# Patient Record
Sex: Female | Born: 1935 | Race: Black or African American | Hispanic: No | Marital: Married | Smoking: Never smoker
Health system: Southern US, Community
[De-identification: ages and names within clinical notes are randomized; demographics above are authoritative.]

## PROBLEM LIST (undated history)

## (undated) DIAGNOSIS — R630 Anorexia: Secondary | ICD-10-CM

## (undated) DIAGNOSIS — E876 Hypokalemia: Secondary | ICD-10-CM

## (undated) DIAGNOSIS — Z9889 Other specified postprocedural states: Secondary | ICD-10-CM

## (undated) DIAGNOSIS — Z5189 Encounter for other specified aftercare: Secondary | ICD-10-CM

## (undated) DIAGNOSIS — F0390 Unspecified dementia without behavioral disturbance: Secondary | ICD-10-CM

## (undated) DIAGNOSIS — I639 Cerebral infarction, unspecified: Secondary | ICD-10-CM

## (undated) DIAGNOSIS — C541 Malignant neoplasm of endometrium: Secondary | ICD-10-CM

## (undated) DIAGNOSIS — K219 Gastro-esophageal reflux disease without esophagitis: Secondary | ICD-10-CM

## (undated) DIAGNOSIS — K589 Irritable bowel syndrome without diarrhea: Secondary | ICD-10-CM

## (undated) DIAGNOSIS — G20A1 Parkinson's disease without dyskinesia, without mention of fluctuations: Secondary | ICD-10-CM

## (undated) DIAGNOSIS — D689 Coagulation defect, unspecified: Secondary | ICD-10-CM

## (undated) DIAGNOSIS — I2699 Other pulmonary embolism without acute cor pulmonale: Secondary | ICD-10-CM

## (undated) DIAGNOSIS — G629 Polyneuropathy, unspecified: Secondary | ICD-10-CM

## (undated) DIAGNOSIS — N189 Chronic kidney disease, unspecified: Secondary | ICD-10-CM

## (undated) DIAGNOSIS — G20 Parkinson's disease: Secondary | ICD-10-CM

## (undated) DIAGNOSIS — K449 Diaphragmatic hernia without obstruction or gangrene: Secondary | ICD-10-CM

## (undated) DIAGNOSIS — IMO0001 Reserved for inherently not codable concepts without codable children: Secondary | ICD-10-CM

## (undated) DIAGNOSIS — F419 Anxiety disorder, unspecified: Secondary | ICD-10-CM

## (undated) DIAGNOSIS — G40909 Epilepsy, unspecified, not intractable, without status epilepticus: Secondary | ICD-10-CM

## (undated) DIAGNOSIS — F329 Major depressive disorder, single episode, unspecified: Secondary | ICD-10-CM

## (undated) DIAGNOSIS — E042 Nontoxic multinodular goiter: Secondary | ICD-10-CM

## (undated) DIAGNOSIS — K529 Noninfective gastroenteritis and colitis, unspecified: Secondary | ICD-10-CM

## (undated) DIAGNOSIS — I82409 Acute embolism and thrombosis of unspecified deep veins of unspecified lower extremity: Secondary | ICD-10-CM

## (undated) DIAGNOSIS — G4700 Insomnia, unspecified: Secondary | ICD-10-CM

## (undated) DIAGNOSIS — F32A Depression, unspecified: Secondary | ICD-10-CM

## (undated) DIAGNOSIS — IMO0002 Reserved for concepts with insufficient information to code with codable children: Secondary | ICD-10-CM

## (undated) HISTORY — DX: Insomnia, unspecified: G47.00

## (undated) HISTORY — DX: Depression, unspecified: F32.A

## (undated) HISTORY — DX: Hypokalemia: E87.6

## (undated) HISTORY — DX: Coagulation defect, unspecified: D68.9

## (undated) HISTORY — DX: Noninfective gastroenteritis and colitis, unspecified: K52.9

## (undated) HISTORY — DX: Encounter for other specified aftercare: Z51.89

## (undated) HISTORY — DX: Other pulmonary embolism without acute cor pulmonale: I26.99

## (undated) HISTORY — DX: Epilepsy, unspecified, not intractable, without status epilepticus: G40.909

## (undated) HISTORY — DX: Gastro-esophageal reflux disease without esophagitis: K21.9

## (undated) HISTORY — DX: Cerebral infarction, unspecified: I63.9

## (undated) HISTORY — DX: Anxiety disorder, unspecified: F41.9

## (undated) HISTORY — DX: Irritable bowel syndrome, unspecified: K58.9

## (undated) HISTORY — PX: LYMPHADENECTOMY: SHX15

## (undated) HISTORY — DX: Acute embolism and thrombosis of unspecified deep veins of unspecified lower extremity: I82.409

## (undated) HISTORY — DX: Polyneuropathy, unspecified: G62.9

## (undated) HISTORY — DX: Unspecified dementia, unspecified severity, without behavioral disturbance, psychotic disturbance, mood disturbance, and anxiety: F03.90

## (undated) HISTORY — PX: CHOLECYSTECTOMY: SHX55

## (undated) HISTORY — DX: Diaphragmatic hernia without obstruction or gangrene: K44.9

## (undated) HISTORY — DX: Other specified postprocedural states: Z98.890

## (undated) HISTORY — DX: Anorexia: R63.0

## (undated) HISTORY — PX: OTHER SURGICAL HISTORY: SHX169

## (undated) HISTORY — DX: Major depressive disorder, single episode, unspecified: F32.9

## (undated) HISTORY — DX: Nontoxic multinodular goiter: E04.2

---

## 1999-06-19 ENCOUNTER — Encounter: Admission: RE | Admit: 1999-06-19 | Discharge: 1999-06-19 | Payer: Self-pay | Admitting: Internal Medicine

## 1999-06-19 ENCOUNTER — Encounter: Payer: Self-pay | Admitting: Internal Medicine

## 1999-12-10 ENCOUNTER — Other Ambulatory Visit: Admission: RE | Admit: 1999-12-10 | Discharge: 1999-12-10 | Payer: Self-pay

## 1999-12-10 ENCOUNTER — Encounter (INDEPENDENT_AMBULATORY_CARE_PROVIDER_SITE_OTHER): Payer: Self-pay | Admitting: Specialist

## 2000-04-22 ENCOUNTER — Other Ambulatory Visit: Admission: RE | Admit: 2000-04-22 | Discharge: 2000-04-22 | Payer: Self-pay | Admitting: Otolaryngology

## 2000-04-22 ENCOUNTER — Encounter (INDEPENDENT_AMBULATORY_CARE_PROVIDER_SITE_OTHER): Payer: Self-pay

## 2002-05-25 ENCOUNTER — Other Ambulatory Visit: Admission: RE | Admit: 2002-05-25 | Discharge: 2002-05-25 | Payer: Self-pay | Admitting: Internal Medicine

## 2004-05-29 ENCOUNTER — Other Ambulatory Visit: Admission: RE | Admit: 2004-05-29 | Discharge: 2004-05-29 | Payer: Self-pay | Admitting: Internal Medicine

## 2005-01-05 HISTORY — PX: OTHER SURGICAL HISTORY: SHX169

## 2005-02-26 ENCOUNTER — Emergency Department (HOSPITAL_COMMUNITY): Admission: EM | Admit: 2005-02-26 | Discharge: 2005-02-26 | Payer: Self-pay | Admitting: Emergency Medicine

## 2005-07-07 ENCOUNTER — Ambulatory Visit (HOSPITAL_COMMUNITY): Admission: AD | Admit: 2005-07-07 | Discharge: 2005-07-07 | Payer: Self-pay | Admitting: Obstetrics and Gynecology

## 2005-07-07 ENCOUNTER — Encounter (INDEPENDENT_AMBULATORY_CARE_PROVIDER_SITE_OTHER): Payer: Self-pay | Admitting: *Deleted

## 2005-07-07 ENCOUNTER — Ambulatory Visit: Payer: Self-pay | Admitting: Obstetrics and Gynecology

## 2005-07-28 ENCOUNTER — Encounter: Payer: Self-pay | Admitting: Vascular Surgery

## 2005-07-28 ENCOUNTER — Inpatient Hospital Stay (HOSPITAL_COMMUNITY): Admission: EM | Admit: 2005-07-28 | Discharge: 2005-07-31 | Payer: Self-pay | Admitting: Emergency Medicine

## 2005-08-04 ENCOUNTER — Ambulatory Visit: Admission: RE | Admit: 2005-08-04 | Discharge: 2005-08-04 | Payer: Self-pay | Admitting: Gynecologic Oncology

## 2005-08-11 ENCOUNTER — Inpatient Hospital Stay (HOSPITAL_COMMUNITY): Admission: RE | Admit: 2005-08-11 | Discharge: 2005-08-14 | Payer: Self-pay | Admitting: Obstetrics & Gynecology

## 2005-08-11 ENCOUNTER — Encounter (INDEPENDENT_AMBULATORY_CARE_PROVIDER_SITE_OTHER): Payer: Self-pay | Admitting: Specialist

## 2005-08-11 HISTORY — PX: ABDOMINAL HYSTERECTOMY: SHX81

## 2005-08-11 HISTORY — PX: LAPAROSCOPIC ASSISTED RADICAL VAGINAL HYSTERECTOMY W/ NODE BIOPSY: SHX1919

## 2005-08-25 ENCOUNTER — Ambulatory Visit (HOSPITAL_COMMUNITY): Admission: RE | Admit: 2005-08-25 | Discharge: 2005-08-25 | Payer: Self-pay | Admitting: Gynecology

## 2005-09-03 ENCOUNTER — Inpatient Hospital Stay (HOSPITAL_COMMUNITY): Admission: EM | Admit: 2005-09-03 | Discharge: 2005-09-06 | Payer: Self-pay | Admitting: Internal Medicine

## 2005-09-08 ENCOUNTER — Ambulatory Visit: Admission: RE | Admit: 2005-09-08 | Discharge: 2005-09-08 | Payer: Self-pay | Admitting: Gynecologic Oncology

## 2005-09-12 ENCOUNTER — Emergency Department (HOSPITAL_COMMUNITY): Admission: EM | Admit: 2005-09-12 | Discharge: 2005-09-12 | Payer: Self-pay | Admitting: Emergency Medicine

## 2005-09-14 ENCOUNTER — Ambulatory Visit (HOSPITAL_COMMUNITY): Admission: RE | Admit: 2005-09-14 | Discharge: 2005-09-14 | Payer: Self-pay | Admitting: Gynecologic Oncology

## 2005-09-22 ENCOUNTER — Ambulatory Visit: Admission: RE | Admit: 2005-09-22 | Discharge: 2005-09-22 | Payer: Self-pay | Admitting: Gynecologic Oncology

## 2005-09-24 ENCOUNTER — Ambulatory Visit: Payer: Self-pay | Admitting: Hematology and Oncology

## 2005-09-27 ENCOUNTER — Inpatient Hospital Stay (HOSPITAL_COMMUNITY): Admission: EM | Admit: 2005-09-27 | Discharge: 2005-09-30 | Payer: Self-pay | Admitting: Emergency Medicine

## 2005-10-09 ENCOUNTER — Ambulatory Visit (HOSPITAL_COMMUNITY): Admission: RE | Admit: 2005-10-09 | Discharge: 2005-10-09 | Payer: Self-pay | Admitting: Hematology and Oncology

## 2005-11-04 LAB — CBC WITH DIFFERENTIAL/PLATELET
EOS%: 0.1 % (ref 0.0–7.0)
Eosinophils Absolute: 0 10*3/uL (ref 0.0–0.5)
LYMPH%: 24.8 % (ref 14.0–48.0)
MCH: 29.8 pg (ref 26.0–34.0)
MCV: 89.3 fL (ref 81.0–101.0)
MONO%: 8.5 % (ref 0.0–13.0)
NEUT#: 3.9 10*3/uL (ref 1.5–6.5)
Platelets: 258 10*3/uL (ref 145–400)
RBC: 3.92 10*6/uL (ref 3.70–5.32)

## 2005-11-04 LAB — COMPREHENSIVE METABOLIC PANEL
Alkaline Phosphatase: 52 U/L (ref 39–117)
BUN: 8 mg/dL (ref 6–23)
Glucose, Bld: 90 mg/dL (ref 70–99)
Sodium: 141 mEq/L (ref 135–145)
Total Bilirubin: 1.2 mg/dL (ref 0.3–1.2)
Total Protein: 5.9 g/dL — ABNORMAL LOW (ref 6.0–8.3)

## 2005-11-05 ENCOUNTER — Ambulatory Visit: Payer: Self-pay | Admitting: Hematology and Oncology

## 2005-11-13 LAB — CBC WITH DIFFERENTIAL/PLATELET
BASO%: 0.6 % (ref 0.0–2.0)
Eosinophils Absolute: 0.1 10*3/uL (ref 0.0–0.5)
HCT: 35.8 % (ref 34.8–46.6)
LYMPH%: 45.5 % (ref 14.0–48.0)
MCHC: 33.4 g/dL (ref 32.0–36.0)
MONO#: 0.3 10*3/uL (ref 0.1–0.9)
NEUT#: 2.1 10*3/uL (ref 1.5–6.5)
NEUT%: 46.2 % (ref 39.6–76.8)
Platelets: 44 10*3/uL — ABNORMAL LOW (ref 145–400)
RBC: 4.03 10*6/uL (ref 3.70–5.32)
WBC: 4.6 10*3/uL (ref 3.9–10.0)
lymph#: 2.1 10*3/uL (ref 0.9–3.3)

## 2005-11-13 LAB — BASIC METABOLIC PANEL
CO2: 26 mEq/L (ref 19–32)
Calcium: 9.2 mg/dL (ref 8.4–10.5)
Chloride: 100 mEq/L (ref 96–112)
Glucose, Bld: 105 mg/dL — ABNORMAL HIGH (ref 70–99)
Sodium: 137 mEq/L (ref 135–145)

## 2005-11-23 ENCOUNTER — Encounter: Admission: RE | Admit: 2005-11-23 | Discharge: 2005-11-23 | Payer: Self-pay | Admitting: Internal Medicine

## 2005-11-24 LAB — COMPREHENSIVE METABOLIC PANEL
ALT: 10 U/L (ref 0–35)
AST: 13 U/L (ref 0–37)
Alkaline Phosphatase: 61 U/L (ref 39–117)
Glucose, Bld: 107 mg/dL — ABNORMAL HIGH (ref 70–99)
Potassium: 3.2 mEq/L — ABNORMAL LOW (ref 3.5–5.3)
Sodium: 142 mEq/L (ref 135–145)
Total Bilirubin: 1 mg/dL (ref 0.3–1.2)
Total Protein: 6.4 g/dL (ref 6.0–8.3)

## 2005-11-24 LAB — CBC WITH DIFFERENTIAL/PLATELET
BASO%: 0.3 % (ref 0.0–2.0)
EOS%: 0.2 % (ref 0.0–7.0)
LYMPH%: 33.1 % (ref 14.0–48.0)
MCHC: 34.7 g/dL (ref 32.0–36.0)
MCV: 89.7 fL (ref 81.0–101.0)
MONO%: 7 % (ref 0.0–13.0)
Platelets: 370 10*3/uL (ref 145–400)
RBC: 3.58 10*6/uL — ABNORMAL LOW (ref 3.70–5.32)
RDW: 18.6 % — ABNORMAL HIGH (ref 11.3–14.5)

## 2005-12-05 DIAGNOSIS — E042 Nontoxic multinodular goiter: Secondary | ICD-10-CM

## 2005-12-05 HISTORY — DX: Nontoxic multinodular goiter: E04.2

## 2005-12-11 ENCOUNTER — Ambulatory Visit (HOSPITAL_COMMUNITY): Admission: RE | Admit: 2005-12-11 | Discharge: 2005-12-11 | Payer: Self-pay | Admitting: Hematology and Oncology

## 2005-12-14 LAB — CBC WITH DIFFERENTIAL/PLATELET
Basophils Absolute: 0 10*3/uL (ref 0.0–0.1)
Eosinophils Absolute: 0 10*3/uL (ref 0.0–0.5)
HCT: 39 % (ref 34.8–46.6)
HGB: 13.2 g/dL (ref 11.6–15.9)
MCH: 31.3 pg (ref 26.0–34.0)
NEUT#: 2 10*3/uL (ref 1.5–6.5)
NEUT%: 48.8 % (ref 39.6–76.8)
RDW: 18.8 % — ABNORMAL HIGH (ref 11.3–14.5)
lymph#: 1.8 10*3/uL (ref 0.9–3.3)

## 2005-12-14 LAB — COMPREHENSIVE METABOLIC PANEL
Alkaline Phosphatase: 59 U/L (ref 39–117)
BUN: 10 mg/dL (ref 6–23)
Creatinine, Ser: 0.74 mg/dL (ref 0.40–1.20)
Glucose, Bld: 104 mg/dL — ABNORMAL HIGH (ref 70–99)
Sodium: 141 mEq/L (ref 135–145)
Total Bilirubin: 1.6 mg/dL — ABNORMAL HIGH (ref 0.3–1.2)
Total Protein: 7 g/dL (ref 6.0–8.3)

## 2005-12-15 LAB — CBC WITH DIFFERENTIAL/PLATELET
Basophils Absolute: 0 10*3/uL (ref 0.0–0.1)
Eosinophils Absolute: 0 10*3/uL (ref 0.0–0.5)
HCT: 34.9 % (ref 34.8–46.6)
HGB: 11.9 g/dL (ref 11.6–15.9)
LYMPH%: 30.7 % (ref 14.0–48.0)
MCV: 92.6 fL (ref 81.0–101.0)
MONO#: 0.3 10*3/uL (ref 0.1–0.9)
MONO%: 7.2 % (ref 0.0–13.0)
NEUT#: 2.6 10*3/uL (ref 1.5–6.5)
NEUT%: 60.6 % (ref 39.6–76.8)
Platelets: 221 10*3/uL (ref 145–400)

## 2005-12-15 LAB — COMPREHENSIVE METABOLIC PANEL
Alkaline Phosphatase: 56 U/L (ref 39–117)
BUN: 5 mg/dL — ABNORMAL LOW (ref 6–23)
CO2: 27 mEq/L (ref 19–32)
Glucose, Bld: 100 mg/dL — ABNORMAL HIGH (ref 70–99)
Total Bilirubin: 1.3 mg/dL — ABNORMAL HIGH (ref 0.3–1.2)

## 2005-12-15 LAB — CA 125: CA 125: 7.1 U/mL (ref 0.0–30.2)

## 2005-12-18 ENCOUNTER — Ambulatory Visit: Admission: RE | Admit: 2005-12-18 | Discharge: 2006-03-18 | Payer: Self-pay | Admitting: Radiation Oncology

## 2006-01-12 ENCOUNTER — Ambulatory Visit: Payer: Self-pay | Admitting: Hematology and Oncology

## 2006-01-12 LAB — CBC WITH DIFFERENTIAL/PLATELET
Basophils Absolute: 0 10*3/uL (ref 0.0–0.1)
Eosinophils Absolute: 0 10*3/uL (ref 0.0–0.5)
HGB: 12 g/dL (ref 11.6–15.9)
LYMPH%: 45 % (ref 14.0–48.0)
MCV: 95 fL (ref 81.0–101.0)
MONO%: 7.1 % (ref 0.0–13.0)
NEUT#: 1.2 10*3/uL — ABNORMAL LOW (ref 1.5–6.5)
Platelets: 195 10*3/uL (ref 145–400)

## 2006-01-14 LAB — CBC WITH DIFFERENTIAL/PLATELET
Basophils Absolute: 0 10*3/uL (ref 0.0–0.1)
Eosinophils Absolute: 0 10*3/uL (ref 0.0–0.5)
HGB: 12.4 g/dL (ref 11.6–15.9)
LYMPH%: 39.3 % (ref 14.0–48.0)
MCV: 92.9 fL (ref 81.0–101.0)
MONO%: 5.9 % (ref 0.0–13.0)
NEUT#: 1.2 10*3/uL — ABNORMAL LOW (ref 1.5–6.5)
Platelets: 150 10*3/uL (ref 145–400)
RDW: 13 % (ref 11.3–14.5)

## 2006-01-14 LAB — BASIC METABOLIC PANEL
BUN: 10 mg/dL (ref 6–23)
CO2: 27 mEq/L (ref 19–32)
Glucose, Bld: 113 mg/dL — ABNORMAL HIGH (ref 70–99)
Potassium: 3.4 mEq/L — ABNORMAL LOW (ref 3.5–5.3)

## 2006-01-19 LAB — CBC WITH DIFFERENTIAL/PLATELET
BASO%: 0.3 % (ref 0.0–2.0)
EOS%: 0 % (ref 0.0–7.0)
LYMPH%: 32.1 % (ref 14.0–48.0)
MCH: 32.5 pg (ref 26.0–34.0)
MCHC: 34.3 g/dL (ref 32.0–36.0)
MONO#: 0.2 10*3/uL (ref 0.1–0.9)
MONO%: 8.6 % (ref 0.0–13.0)
Platelets: 169 10*3/uL (ref 145–400)
RBC: 3.79 10*6/uL (ref 3.70–5.32)
WBC: 2.4 10*3/uL — ABNORMAL LOW (ref 3.9–10.0)

## 2006-01-20 ENCOUNTER — Ambulatory Visit: Admission: RE | Admit: 2006-01-20 | Discharge: 2006-01-20 | Payer: Self-pay | Admitting: Gynecology

## 2006-01-26 LAB — CBC WITH DIFFERENTIAL/PLATELET
BASO%: 0.4 % (ref 0.0–2.0)
EOS%: 19.7 % — ABNORMAL HIGH (ref 0.0–7.0)
HCT: 34.9 % (ref 34.8–46.6)
MCH: 32.5 pg (ref 26.0–34.0)
MCHC: 34.3 g/dL (ref 32.0–36.0)
MONO#: 0.2 10*3/uL (ref 0.1–0.9)
RBC: 3.68 10*6/uL — ABNORMAL LOW (ref 3.70–5.32)
RDW: 14.1 % (ref 11.3–14.5)
WBC: 2.3 10*3/uL — ABNORMAL LOW (ref 3.9–10.0)
lymph#: 0.6 10*3/uL — ABNORMAL LOW (ref 0.9–3.3)

## 2006-02-02 LAB — CBC WITH DIFFERENTIAL/PLATELET
BASO%: 0.4 % (ref 0.0–2.0)
Basophils Absolute: 0 10*3/uL (ref 0.0–0.1)
HCT: 36.6 % (ref 34.8–46.6)
HGB: 12.5 g/dL (ref 11.6–15.9)
MONO#: 0.2 10*3/uL (ref 0.1–0.9)
NEUT#: 1.3 10*3/uL — ABNORMAL LOW (ref 1.5–6.5)
NEUT%: 51.1 % (ref 39.6–76.8)
RDW: 13.9 % (ref 11.3–14.5)
WBC: 2.5 10*3/uL — ABNORMAL LOW (ref 3.9–10.0)
lymph#: 0.4 10*3/uL — ABNORMAL LOW (ref 0.9–3.3)

## 2006-02-08 LAB — CBC WITH DIFFERENTIAL/PLATELET
Basophils Absolute: 0 10*3/uL (ref 0.0–0.1)
EOS%: 19.5 % — ABNORMAL HIGH (ref 0.0–7.0)
Eosinophils Absolute: 0.5 10*3/uL (ref 0.0–0.5)
HGB: 12.2 g/dL (ref 11.6–15.9)
LYMPH%: 14.5 % (ref 14.0–48.0)
MCH: 32.3 pg (ref 26.0–34.0)
MCV: 92.8 fL (ref 81.0–101.0)
MONO%: 9.6 % (ref 0.0–13.0)
NEUT#: 1.4 10*3/uL — ABNORMAL LOW (ref 1.5–6.5)
Platelets: 230 10*3/uL (ref 145–400)
RDW: 14 % (ref 11.3–14.5)

## 2006-02-08 LAB — BASIC METABOLIC PANEL
BUN: 6 mg/dL (ref 6–23)
CO2: 32 mEq/L (ref 19–32)
Chloride: 100 mEq/L (ref 96–112)
Creatinine, Ser: 0.7 mg/dL (ref 0.40–1.20)
Potassium: 2.8 mEq/L — ABNORMAL LOW (ref 3.5–5.3)

## 2006-02-17 LAB — CBC WITH DIFFERENTIAL/PLATELET
BASO%: 0.5 % (ref 0.0–2.0)
Eosinophils Absolute: 0.1 10*3/uL (ref 0.0–0.5)
LYMPH%: 10.7 % — ABNORMAL LOW (ref 14.0–48.0)
MCHC: 36 g/dL (ref 32.0–36.0)
MONO#: 0.2 10*3/uL (ref 0.1–0.9)
NEUT#: 2.2 10*3/uL (ref 1.5–6.5)
RBC: 4 10*6/uL (ref 3.70–5.32)
RDW: 13.6 % (ref 11.3–14.5)
WBC: 2.8 10*3/uL — ABNORMAL LOW (ref 3.9–10.0)
lymph#: 0.3 10*3/uL — ABNORMAL LOW (ref 0.9–3.3)

## 2006-02-17 LAB — BASIC METABOLIC PANEL
BUN: 5 mg/dL — ABNORMAL LOW (ref 6–23)
Calcium: 8.8 mg/dL (ref 8.4–10.5)
Creatinine, Ser: 0.65 mg/dL (ref 0.40–1.20)
Glucose, Bld: 97 mg/dL (ref 70–99)
Potassium: 2.8 mEq/L — ABNORMAL LOW (ref 3.5–5.3)

## 2006-03-24 ENCOUNTER — Ambulatory Visit: Admission: RE | Admit: 2006-03-24 | Discharge: 2006-03-24 | Payer: Self-pay | Admitting: Gynecology

## 2006-04-05 ENCOUNTER — Ambulatory Visit: Payer: Self-pay | Admitting: Hematology and Oncology

## 2006-04-07 LAB — CBC WITH DIFFERENTIAL/PLATELET
BASO%: 0 % (ref 0.0–2.0)
Eosinophils Absolute: 0 10*3/uL (ref 0.0–0.5)
HCT: 32.3 % — ABNORMAL LOW (ref 34.8–46.6)
HGB: 11.3 g/dL — ABNORMAL LOW (ref 11.6–15.9)
MCHC: 35 g/dL (ref 32.0–36.0)
MONO#: 0.3 10*3/uL (ref 0.1–0.9)
NEUT#: 1.8 10*3/uL (ref 1.5–6.5)
NEUT%: 70 % (ref 39.6–76.8)
Platelets: 208 10*3/uL (ref 145–400)
WBC: 2.6 10*3/uL — ABNORMAL LOW (ref 3.9–10.0)
lymph#: 0.4 10*3/uL — ABNORMAL LOW (ref 0.9–3.3)

## 2006-04-07 LAB — COMPREHENSIVE METABOLIC PANEL
ALT: 14 U/L (ref 0–35)
CO2: 28 mEq/L (ref 19–32)
Calcium: 9.4 mg/dL (ref 8.4–10.5)
Chloride: 105 mEq/L (ref 96–112)
Creatinine, Ser: 0.72 mg/dL (ref 0.40–1.20)
Glucose, Bld: 89 mg/dL (ref 70–99)
Sodium: 141 mEq/L (ref 135–145)
Total Protein: 6.4 g/dL (ref 6.0–8.3)

## 2006-04-28 LAB — CBC WITH DIFFERENTIAL/PLATELET
BASO%: 0.2 % (ref 0.0–2.0)
MCHC: 35.4 g/dL (ref 32.0–36.0)
MONO#: 0.3 10*3/uL (ref 0.1–0.9)
NEUT#: 2.2 10*3/uL (ref 1.5–6.5)
RBC: 3.55 10*6/uL — ABNORMAL LOW (ref 3.70–5.32)
WBC: 2.9 10*3/uL — ABNORMAL LOW (ref 3.9–10.0)
lymph#: 0.4 10*3/uL — ABNORMAL LOW (ref 0.9–3.3)

## 2006-04-28 LAB — COMPREHENSIVE METABOLIC PANEL
ALT: 15 U/L (ref 0–35)
Albumin: 4 g/dL (ref 3.5–5.2)
CO2: 28 mEq/L (ref 19–32)
Calcium: 9 mg/dL (ref 8.4–10.5)
Chloride: 105 mEq/L (ref 96–112)
Potassium: 3.7 mEq/L (ref 3.5–5.3)
Sodium: 142 mEq/L (ref 135–145)
Total Protein: 6.8 g/dL (ref 6.0–8.3)

## 2006-05-18 ENCOUNTER — Ambulatory Visit: Payer: Self-pay | Admitting: Hematology and Oncology

## 2006-05-19 LAB — CBC WITH DIFFERENTIAL/PLATELET
BASO%: 0 % (ref 0.0–2.0)
LYMPH%: 9.4 % — ABNORMAL LOW (ref 14.0–48.0)
MCHC: 34.8 g/dL (ref 32.0–36.0)
MONO#: 0.4 10*3/uL (ref 0.1–0.9)
Platelets: 134 10*3/uL — ABNORMAL LOW (ref 145–400)
RBC: 3.38 10*6/uL — ABNORMAL LOW (ref 3.70–5.32)
WBC: 4.3 10*3/uL (ref 3.9–10.0)
lymph#: 0.4 10*3/uL — ABNORMAL LOW (ref 0.9–3.3)

## 2006-05-19 LAB — COMPREHENSIVE METABOLIC PANEL
ALT: 10 U/L (ref 0–35)
CO2: 30 mEq/L (ref 19–32)
Sodium: 143 mEq/L (ref 135–145)
Total Bilirubin: 0.8 mg/dL (ref 0.3–1.2)
Total Protein: 6.7 g/dL (ref 6.0–8.3)

## 2006-05-27 LAB — CBC WITH DIFFERENTIAL/PLATELET
BASO%: 0.5 % (ref 0.0–2.0)
EOS%: 0.1 % (ref 0.0–7.0)
LYMPH%: 4.4 % — ABNORMAL LOW (ref 14.0–48.0)
MCH: 33.9 pg (ref 26.0–34.0)
MCHC: 35.4 g/dL (ref 32.0–36.0)
MCV: 95.6 fL (ref 81.0–101.0)
MONO%: 4.4 % (ref 0.0–13.0)
Platelets: 74 10*3/uL — ABNORMAL LOW (ref 145–400)
RBC: 3.66 10*6/uL — ABNORMAL LOW (ref 3.70–5.32)
WBC: 8.4 10*3/uL (ref 3.9–10.0)

## 2006-05-27 LAB — BASIC METABOLIC PANEL
Calcium: 9.6 mg/dL (ref 8.4–10.5)
Sodium: 142 mEq/L (ref 135–145)

## 2006-06-01 LAB — FECAL OCCULT BLOOD, GUAIAC: Occult Blood: NEGATIVE

## 2006-06-09 LAB — CBC WITH DIFFERENTIAL/PLATELET
BASO%: 0.8 % (ref 0.0–2.0)
Eosinophils Absolute: 0 10*3/uL (ref 0.0–0.5)
MONO#: 0.4 10*3/uL (ref 0.1–0.9)
MONO%: 9.3 % (ref 0.0–13.0)
NEUT#: 3.3 10*3/uL (ref 1.5–6.5)
RBC: 3.18 10*6/uL — ABNORMAL LOW (ref 3.70–5.32)
RDW: 13.4 % (ref 11.3–14.5)
WBC: 4.4 10*3/uL (ref 3.9–10.0)

## 2006-06-25 ENCOUNTER — Ambulatory Visit (HOSPITAL_COMMUNITY): Admission: RE | Admit: 2006-06-25 | Discharge: 2006-06-25 | Payer: Self-pay | Admitting: Oncology

## 2006-06-29 ENCOUNTER — Ambulatory Visit: Payer: Self-pay | Admitting: Hematology and Oncology

## 2006-07-02 ENCOUNTER — Ambulatory Visit: Admission: RE | Admit: 2006-07-02 | Discharge: 2006-07-02 | Payer: Self-pay | Admitting: Gynecology

## 2006-07-02 ENCOUNTER — Encounter: Payer: Self-pay | Admitting: Gynecology

## 2006-07-02 ENCOUNTER — Other Ambulatory Visit: Admission: RE | Admit: 2006-07-02 | Discharge: 2006-07-02 | Payer: Self-pay | Admitting: Gynecology

## 2006-07-02 LAB — COMPREHENSIVE METABOLIC PANEL
ALT: 10 U/L (ref 0–35)
Albumin: 3.8 g/dL (ref 3.5–5.2)
CO2: 27 mEq/L (ref 19–32)
Calcium: 9.1 mg/dL (ref 8.4–10.5)
Chloride: 107 mEq/L (ref 96–112)
Glucose, Bld: 90 mg/dL (ref 70–99)
Sodium: 140 mEq/L (ref 135–145)
Total Bilirubin: 0.8 mg/dL (ref 0.3–1.2)
Total Protein: 6.4 g/dL (ref 6.0–8.3)

## 2006-07-02 LAB — CBC WITH DIFFERENTIAL/PLATELET
Eosinophils Absolute: 0 10*3/uL (ref 0.0–0.5)
HCT: 31.3 % — ABNORMAL LOW (ref 34.8–46.6)
LYMPH%: 18 % (ref 14.0–48.0)
MONO#: 0.3 10*3/uL (ref 0.1–0.9)
NEUT#: 1.9 10*3/uL (ref 1.5–6.5)
Platelets: 216 10*3/uL (ref 145–400)
RBC: 3.19 10*6/uL — ABNORMAL LOW (ref 3.70–5.32)
WBC: 2.7 10*3/uL — ABNORMAL LOW (ref 3.9–10.0)
lymph#: 0.5 10*3/uL — ABNORMAL LOW (ref 0.9–3.3)

## 2006-07-06 LAB — CBC WITH DIFFERENTIAL/PLATELET
BASO%: 1.3 % (ref 0.0–2.0)
Eosinophils Absolute: 0.1 10*3/uL (ref 0.0–0.5)
MCHC: 36.3 g/dL — ABNORMAL HIGH (ref 32.0–36.0)
MONO#: 0.3 10*3/uL (ref 0.1–0.9)
NEUT#: 1.8 10*3/uL (ref 1.5–6.5)
Platelets: 161 10*3/uL (ref 145–400)
RBC: 3.23 10*6/uL — ABNORMAL LOW (ref 3.70–5.32)
RDW: 12.3 % (ref 11.3–14.5)
WBC: 2.6 10*3/uL — ABNORMAL LOW (ref 3.9–10.0)
lymph#: 0.5 10*3/uL — ABNORMAL LOW (ref 0.9–3.3)

## 2006-09-03 ENCOUNTER — Other Ambulatory Visit: Admission: RE | Admit: 2006-09-03 | Discharge: 2006-09-03 | Payer: Self-pay | Admitting: Radiation Oncology

## 2006-09-03 ENCOUNTER — Ambulatory Visit: Admission: RE | Admit: 2006-09-03 | Discharge: 2006-09-28 | Payer: Self-pay | Admitting: Radiation Oncology

## 2006-09-03 ENCOUNTER — Encounter: Payer: Self-pay | Admitting: Radiation Oncology

## 2006-09-24 ENCOUNTER — Other Ambulatory Visit: Admission: RE | Admit: 2006-09-24 | Discharge: 2006-09-24 | Payer: Self-pay | Admitting: Gynecology

## 2006-09-24 ENCOUNTER — Ambulatory Visit: Admission: RE | Admit: 2006-09-24 | Discharge: 2006-09-24 | Payer: Self-pay | Admitting: Gynecology

## 2006-09-24 ENCOUNTER — Encounter: Payer: Self-pay | Admitting: Gynecology

## 2007-01-19 ENCOUNTER — Encounter: Payer: Self-pay | Admitting: Radiation Oncology

## 2007-01-19 ENCOUNTER — Other Ambulatory Visit: Admission: RE | Admit: 2007-01-19 | Discharge: 2007-01-19 | Payer: Self-pay | Admitting: Radiation Oncology

## 2007-01-19 ENCOUNTER — Ambulatory Visit: Admission: RE | Admit: 2007-01-19 | Discharge: 2007-02-15 | Payer: Self-pay | Admitting: Radiation Oncology

## 2007-04-08 ENCOUNTER — Ambulatory Visit: Admission: RE | Admit: 2007-04-08 | Discharge: 2007-04-08 | Payer: Self-pay | Admitting: Gynecology

## 2007-04-08 ENCOUNTER — Encounter: Payer: Self-pay | Admitting: Gynecology

## 2007-04-08 ENCOUNTER — Other Ambulatory Visit: Admission: RE | Admit: 2007-04-08 | Discharge: 2007-04-08 | Payer: Self-pay | Admitting: Gynecology

## 2007-07-20 ENCOUNTER — Ambulatory Visit: Admission: RE | Admit: 2007-07-20 | Discharge: 2007-07-20 | Payer: Self-pay | Admitting: Radiation Oncology

## 2007-07-20 ENCOUNTER — Encounter: Payer: Self-pay | Admitting: Radiation Oncology

## 2007-07-20 ENCOUNTER — Other Ambulatory Visit: Admission: RE | Admit: 2007-07-20 | Discharge: 2007-07-20 | Payer: Self-pay | Admitting: Radiation Oncology

## 2007-10-12 ENCOUNTER — Other Ambulatory Visit: Admission: RE | Admit: 2007-10-12 | Discharge: 2007-10-12 | Payer: Self-pay | Admitting: Gynecology

## 2007-10-12 ENCOUNTER — Ambulatory Visit: Admission: RE | Admit: 2007-10-12 | Discharge: 2007-10-12 | Payer: Self-pay | Admitting: Gynecology

## 2007-10-12 ENCOUNTER — Encounter: Payer: Self-pay | Admitting: Gynecology

## 2008-01-10 ENCOUNTER — Encounter: Admission: RE | Admit: 2008-01-10 | Discharge: 2008-01-10 | Payer: Self-pay | Admitting: Internal Medicine

## 2008-03-14 ENCOUNTER — Other Ambulatory Visit: Admission: RE | Admit: 2008-03-14 | Discharge: 2008-03-14 | Payer: Self-pay | Admitting: Gynecologic Oncology

## 2008-03-14 ENCOUNTER — Encounter: Payer: Self-pay | Admitting: Gynecologic Oncology

## 2008-03-14 ENCOUNTER — Ambulatory Visit: Admission: RE | Admit: 2008-03-14 | Discharge: 2008-03-14 | Payer: Self-pay | Admitting: Gynecologic Oncology

## 2008-03-30 ENCOUNTER — Encounter: Admission: RE | Admit: 2008-03-30 | Discharge: 2008-03-30 | Payer: Self-pay | Admitting: Internal Medicine

## 2008-07-24 ENCOUNTER — Other Ambulatory Visit: Admission: RE | Admit: 2008-07-24 | Discharge: 2008-07-24 | Payer: Self-pay | Admitting: Radiation Oncology

## 2008-07-24 ENCOUNTER — Encounter: Payer: Self-pay | Admitting: Radiation Oncology

## 2008-07-24 ENCOUNTER — Ambulatory Visit: Admission: RE | Admit: 2008-07-24 | Discharge: 2008-07-24 | Payer: Self-pay | Admitting: Radiation Oncology

## 2008-08-08 ENCOUNTER — Ambulatory Visit: Payer: Self-pay | Admitting: Pulmonary Disease

## 2008-08-08 ENCOUNTER — Inpatient Hospital Stay (HOSPITAL_COMMUNITY): Admission: EM | Admit: 2008-08-08 | Discharge: 2008-08-24 | Payer: Self-pay | Admitting: Emergency Medicine

## 2008-08-09 ENCOUNTER — Encounter (INDEPENDENT_AMBULATORY_CARE_PROVIDER_SITE_OTHER): Payer: Self-pay | Admitting: Emergency Medicine

## 2008-08-09 ENCOUNTER — Encounter (INDEPENDENT_AMBULATORY_CARE_PROVIDER_SITE_OTHER): Payer: Self-pay | Admitting: Internal Medicine

## 2008-08-20 ENCOUNTER — Ambulatory Visit: Payer: Self-pay | Admitting: Physical Medicine & Rehabilitation

## 2008-08-24 ENCOUNTER — Inpatient Hospital Stay (HOSPITAL_COMMUNITY)
Admission: RE | Admit: 2008-08-24 | Discharge: 2008-09-08 | Payer: Self-pay | Admitting: Physical Medicine & Rehabilitation

## 2008-08-24 ENCOUNTER — Ambulatory Visit: Payer: Self-pay | Admitting: Physical Medicine & Rehabilitation

## 2008-09-05 ENCOUNTER — Ambulatory Visit: Payer: Self-pay | Admitting: Psychology

## 2008-09-28 ENCOUNTER — Encounter: Admission: RE | Admit: 2008-09-28 | Discharge: 2008-09-28 | Payer: Self-pay | Admitting: Gastroenterology

## 2008-09-30 ENCOUNTER — Inpatient Hospital Stay (HOSPITAL_COMMUNITY): Admission: EM | Admit: 2008-09-30 | Discharge: 2008-10-04 | Payer: Self-pay | Admitting: Emergency Medicine

## 2008-11-28 ENCOUNTER — Other Ambulatory Visit: Admission: RE | Admit: 2008-11-28 | Discharge: 2008-11-28 | Payer: Self-pay | Admitting: Gynecology

## 2008-11-28 ENCOUNTER — Ambulatory Visit: Admission: RE | Admit: 2008-11-28 | Discharge: 2008-11-28 | Payer: Self-pay | Admitting: Gynecology

## 2009-03-26 ENCOUNTER — Ambulatory Visit: Admission: RE | Admit: 2009-03-26 | Discharge: 2009-03-26 | Payer: Self-pay | Admitting: Radiation Oncology

## 2009-03-26 ENCOUNTER — Other Ambulatory Visit: Admission: RE | Admit: 2009-03-26 | Discharge: 2009-03-26 | Payer: Self-pay | Admitting: Radiation Oncology

## 2009-05-24 ENCOUNTER — Ambulatory Visit: Admission: RE | Admit: 2009-05-24 | Discharge: 2009-05-24 | Payer: Self-pay | Admitting: Gynecology

## 2009-05-24 ENCOUNTER — Other Ambulatory Visit: Admission: RE | Admit: 2009-05-24 | Discharge: 2009-05-24 | Payer: Self-pay | Admitting: Gynecology

## 2009-07-02 ENCOUNTER — Ambulatory Visit: Payer: Self-pay | Admitting: Psychiatry

## 2009-07-08 ENCOUNTER — Inpatient Hospital Stay (HOSPITAL_COMMUNITY): Admission: EM | Admit: 2009-07-08 | Discharge: 2009-07-10 | Payer: Self-pay | Admitting: Emergency Medicine

## 2009-07-11 ENCOUNTER — Ambulatory Visit: Payer: Self-pay | Admitting: Psychiatry

## 2009-09-12 ENCOUNTER — Ambulatory Visit (HOSPITAL_COMMUNITY): Admission: RE | Admit: 2009-09-12 | Discharge: 2009-09-12 | Payer: Self-pay | Admitting: Gastroenterology

## 2009-12-05 ENCOUNTER — Ambulatory Visit: Admission: RE | Admit: 2009-12-05 | Discharge: 2009-12-05 | Payer: Self-pay | Admitting: Radiation Oncology

## 2009-12-05 ENCOUNTER — Other Ambulatory Visit
Admission: RE | Admit: 2009-12-05 | Discharge: 2009-12-05 | Payer: Self-pay | Source: Home / Self Care | Admitting: Radiation Oncology

## 2010-01-05 HISTORY — PX: COLONOSCOPY: SHX174

## 2010-01-05 HISTORY — PX: ESOPHAGOGASTRODUODENOSCOPY: SHX1529

## 2010-01-27 ENCOUNTER — Encounter: Payer: Self-pay | Admitting: Geriatric Medicine

## 2010-02-02 IMAGING — RF DG ESOPHAGUS
19 of 24 series · 19 of 24 positions shown · non-contrast
Comparison: [id number]

CLINICAL DATA: Dysphagia

ESOPHOGRAM / BARIUM SWALLOW
TECHNIQUE: Single contrast examination was performed using
Fluoroscopy time:  1.2 minutes.

[Series 1: run · 1 of 1 slices shown (1 of 19)]
[im 1/1]
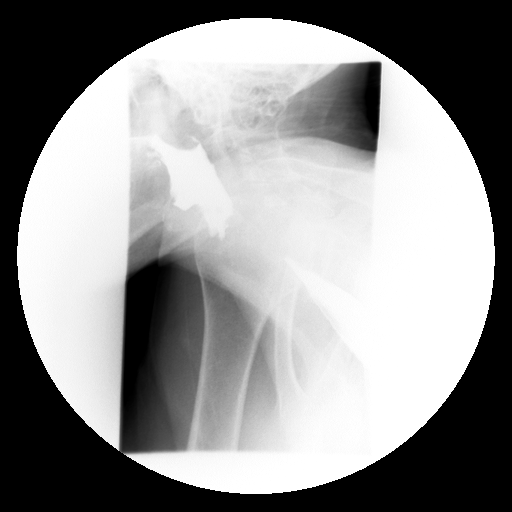

[Series 2: run · 1 of 1 slices shown (2 of 19)]
[im 1/1]
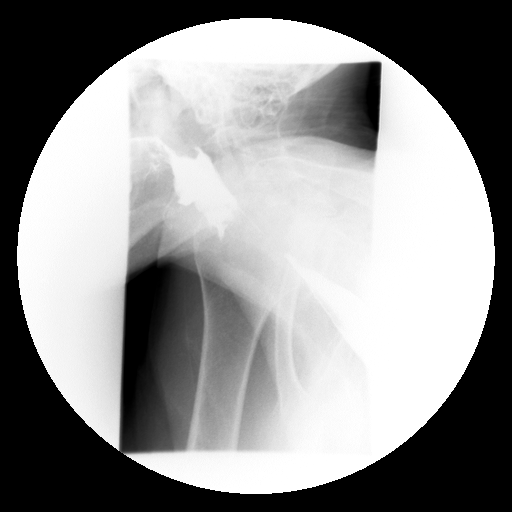

[Series 4: run · 1 of 1 slices shown (3 of 19)]
[im 1/1]
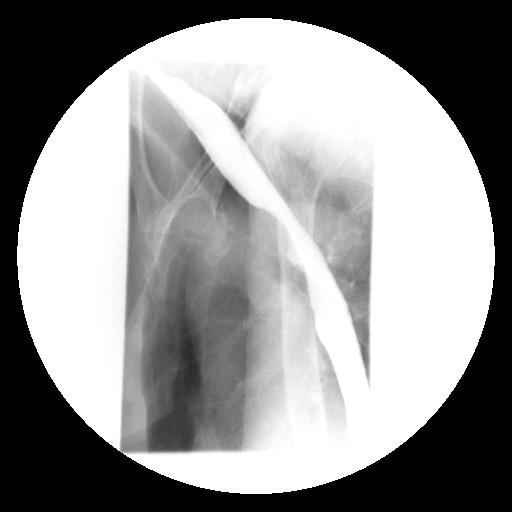

[Series 5: run · 1 of 1 slices shown (4 of 19)]
[im 1/1]
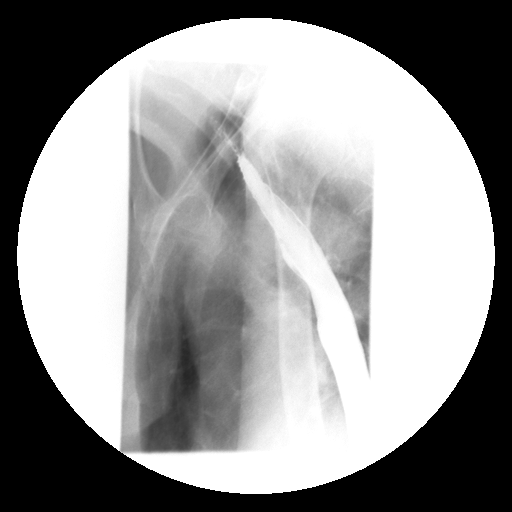

[Series 6: run · 1 of 1 slices shown (5 of 19)]
[im 1/1]
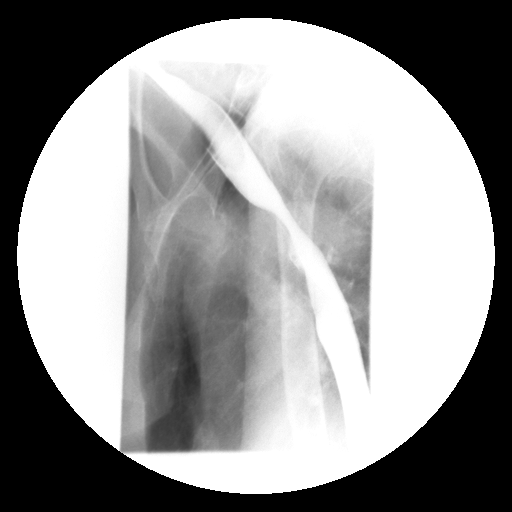

[Series 7: run · 1 of 1 slices shown (6 of 19)]
[im 1/1]
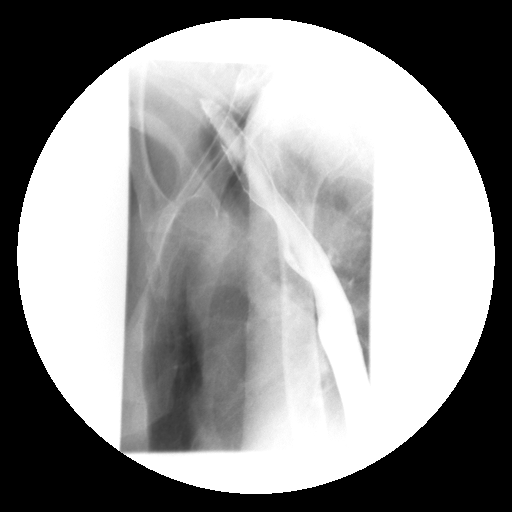

[Series 9: run · 1 of 1 slices shown (7 of 19)]
[im 1/1]
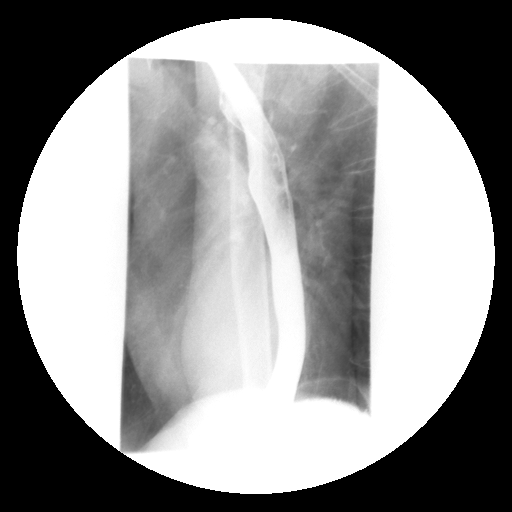

[Series 10: run · 1 of 1 slices shown (8 of 19)]
[im 1/1]
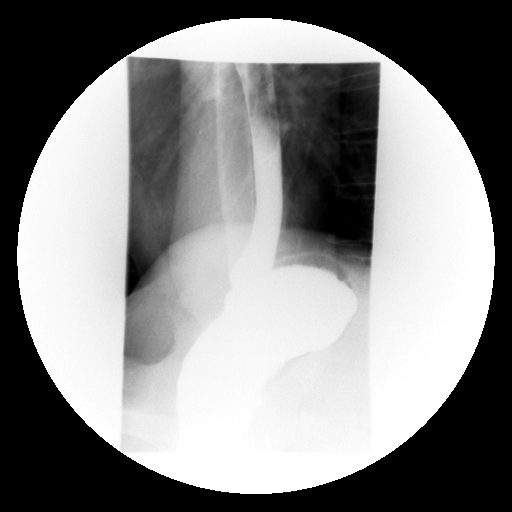

[Series 11: run · 1 of 1 slices shown (9 of 19)]
[im 1/1]
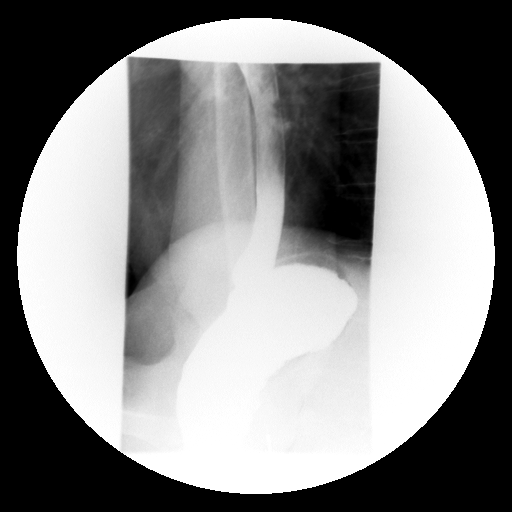

[Series 13: run · 1 of 1 slices shown (10 of 19)]
[im 1/1]
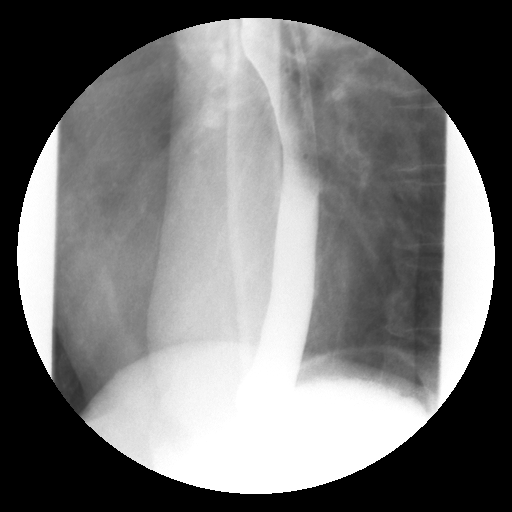

[Series 14: run · 1 of 1 slices shown (11 of 19)]
[im 1/1]
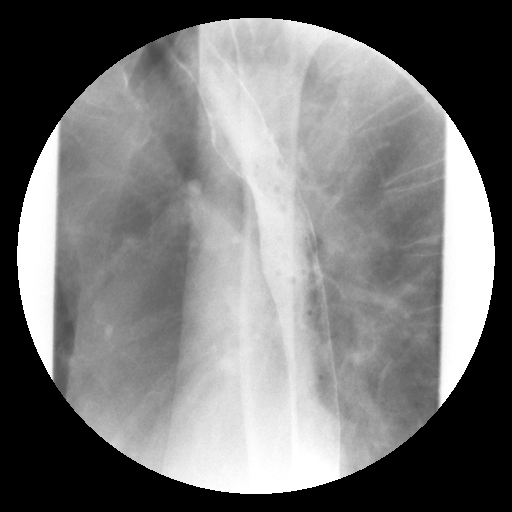

[Series 15: run · 1 of 1 slices shown (12 of 19)]
[im 1/1]
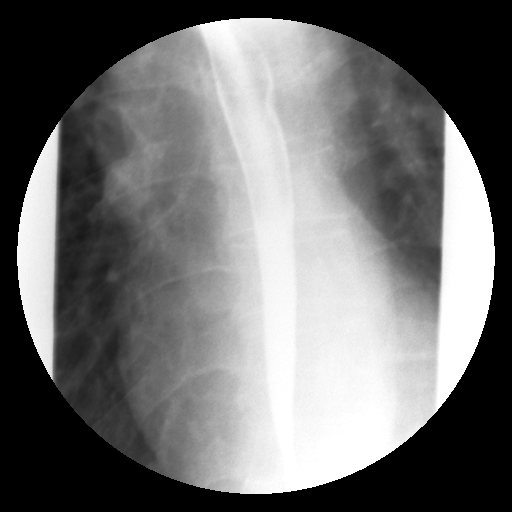

[Series 16: run · 1 of 1 slices shown (13 of 19)]
[im 1/1]
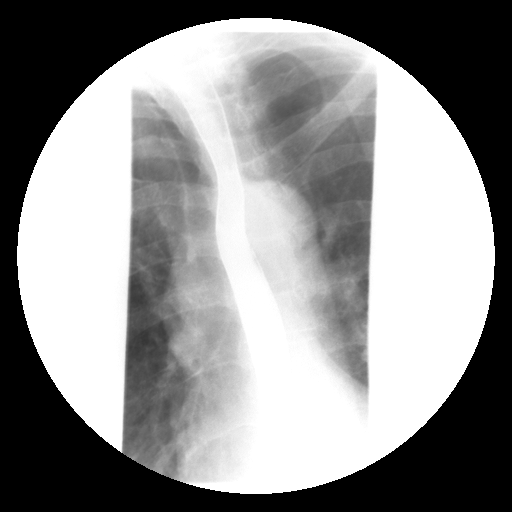

[Series 18: run · 1 of 1 slices shown (14 of 19)]
[im 1/1]
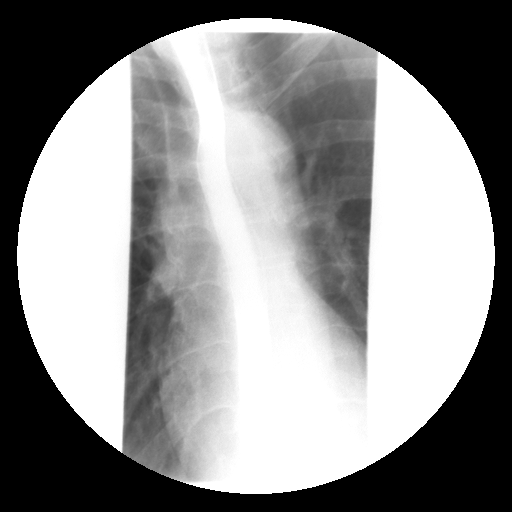

[Series 19: run · 1 of 1 slices shown (15 of 19)]
[im 1/1]
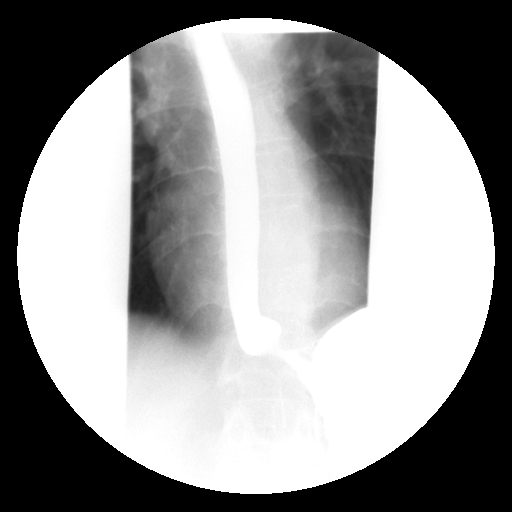

[Series 20: run · 1 of 1 slices shown (16 of 19)]
[im 1/1]
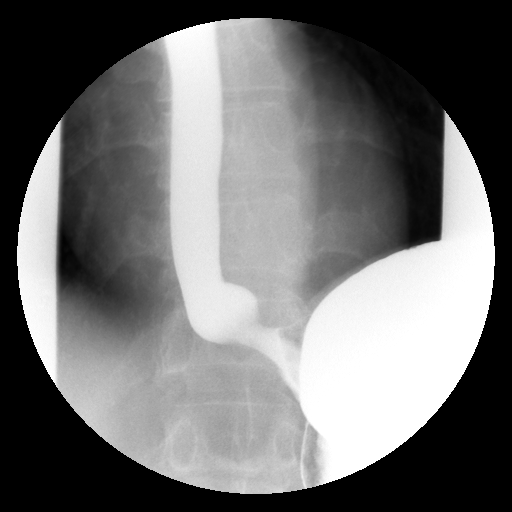

[Series 21: run · 1 of 1 slices shown (17 of 19)]
[im 1/1]
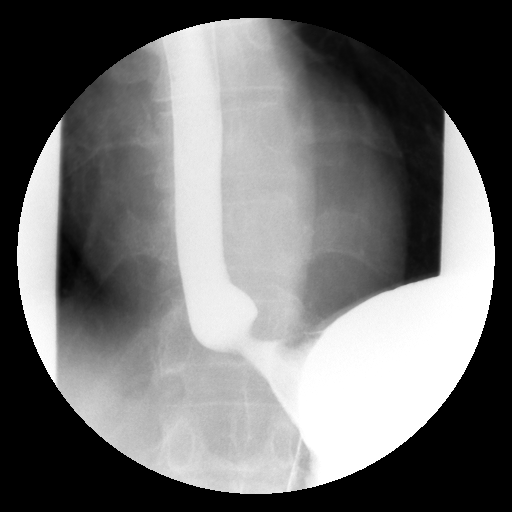

[Series 23: run · 1 of 1 slices shown (18 of 19)]
[im 1/1]
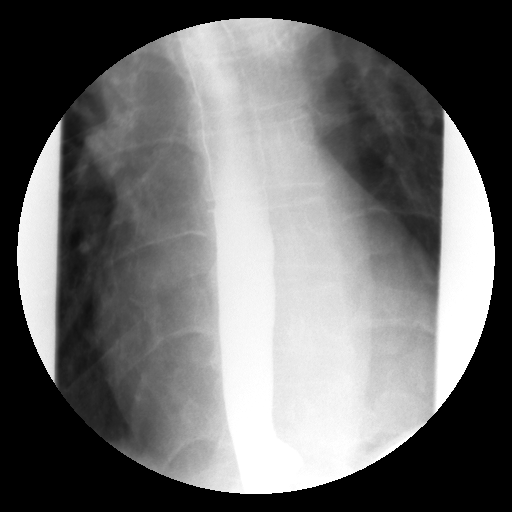

[Series 24: run · 1 of 1 slices shown (19 of 19)]
[im 1/1]
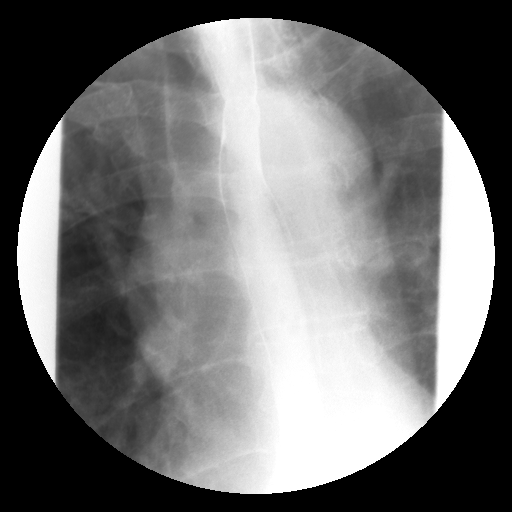

[19 of 24 positions shown; findings below may reference images not displayed]

FINDINGS: The the patient was only able to ingest a small amount
of barium at one time.  No laryngeal penetration or aspiration is
noted. Mild smooth narrowing distal esophagus.  Otherwise no
evidence of esophageal obstructing lesion.  No reflux demonstrated.
The patient ingested a 13 mm barium tablet without difficulty.
IMPRESSION: The the patient was only able to ingest a small amount of barium at
one time.

Mild smooth narrowing distal esophagus.

No esophageal obstructing lesion.

The patient ingested a 13 mm tablet without difficulty.

## 2010-02-04 IMAGING — CR DG ABDOMEN ACUTE W/ 1V CHEST
4 series · 4 of 4 positions shown · non-contrast
Comparison: Abdomen 08/14/2008

CLINICAL DATA: Dehydration.  Abdominal pain.  Diarrhea.  Weakness.

ACUTE ABDOMEN SERIES (ABDOMEN 2 VIEW & CHEST 1 VIEW)

[w chest pa]
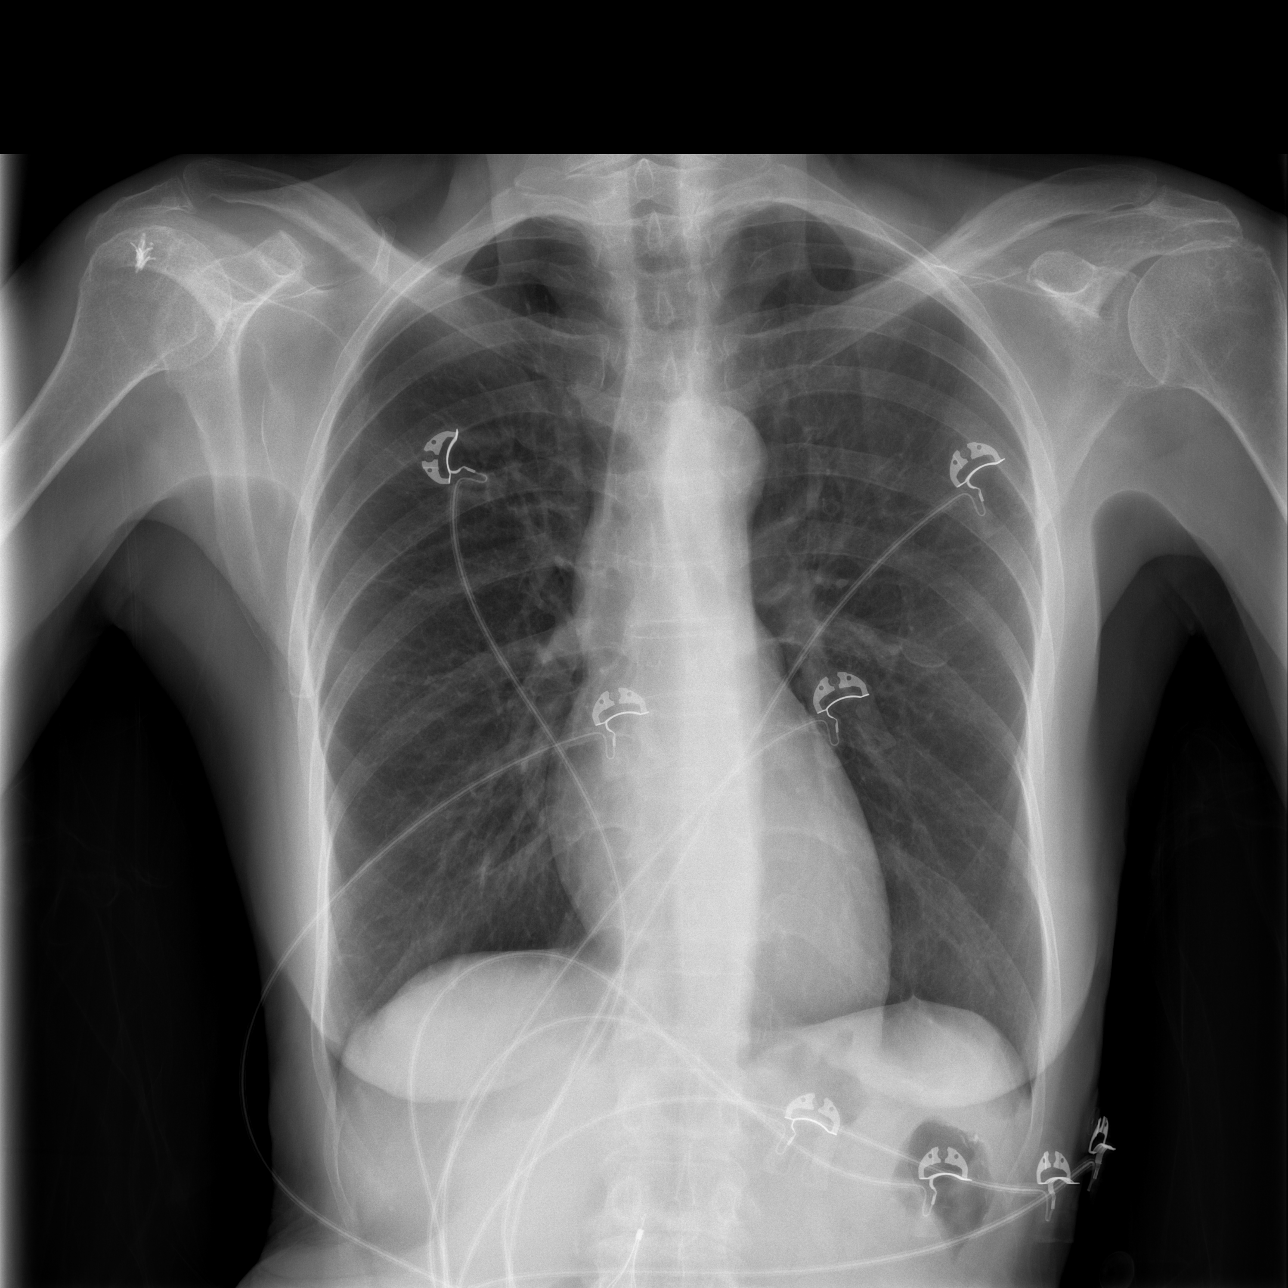

[w abdomen upright]
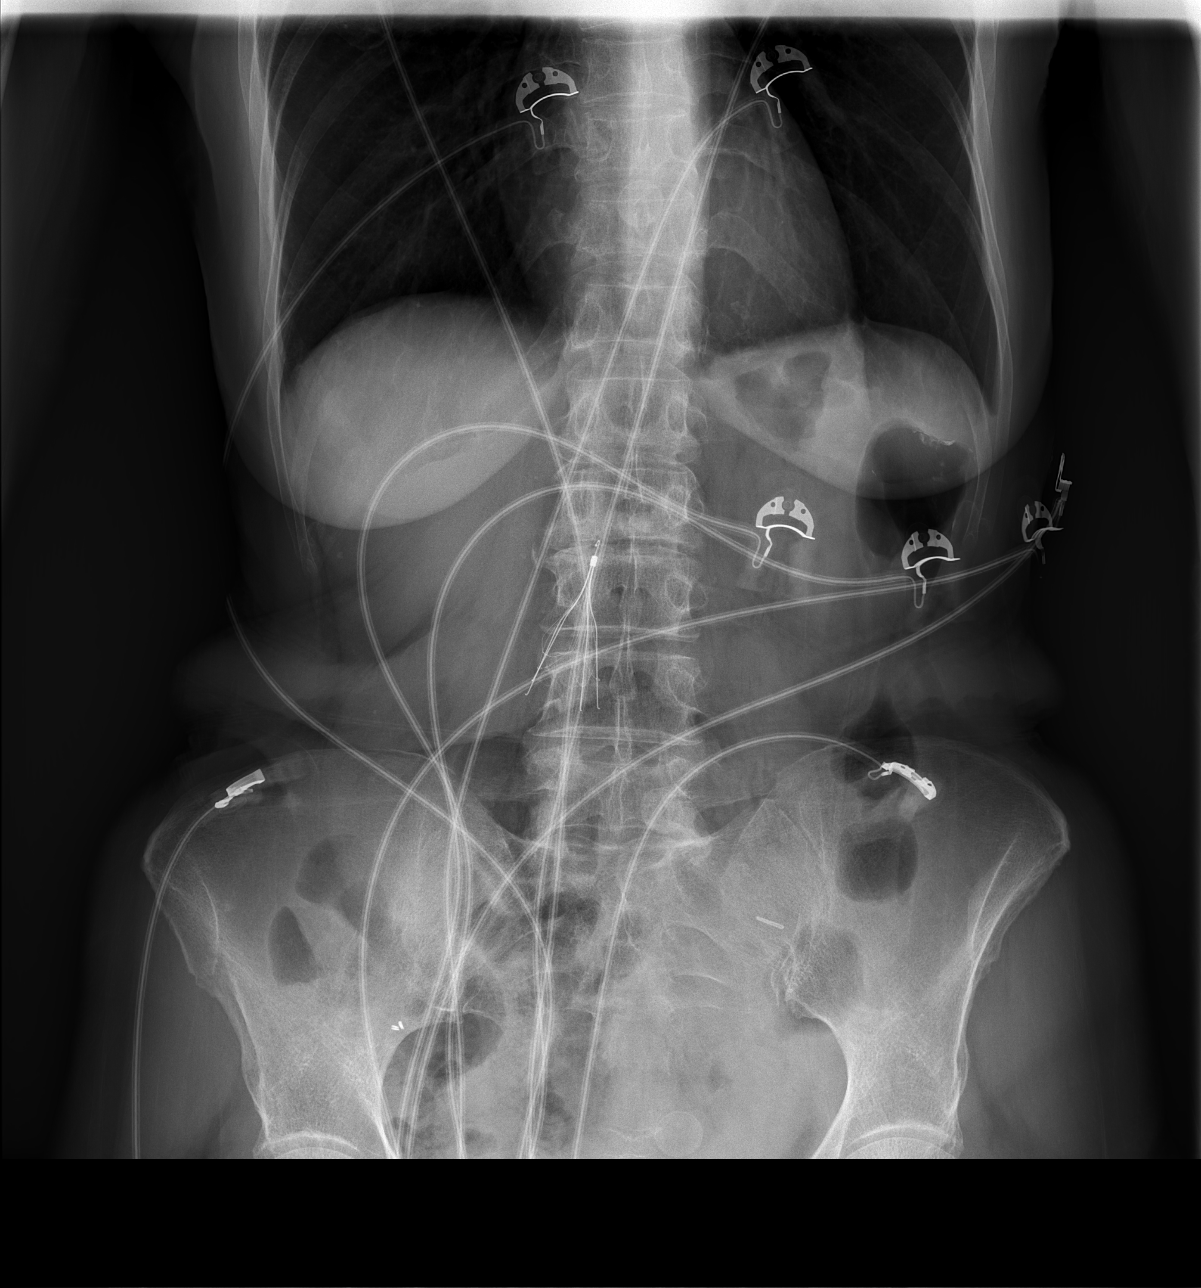

[t abdomen supine (1 of 2)]
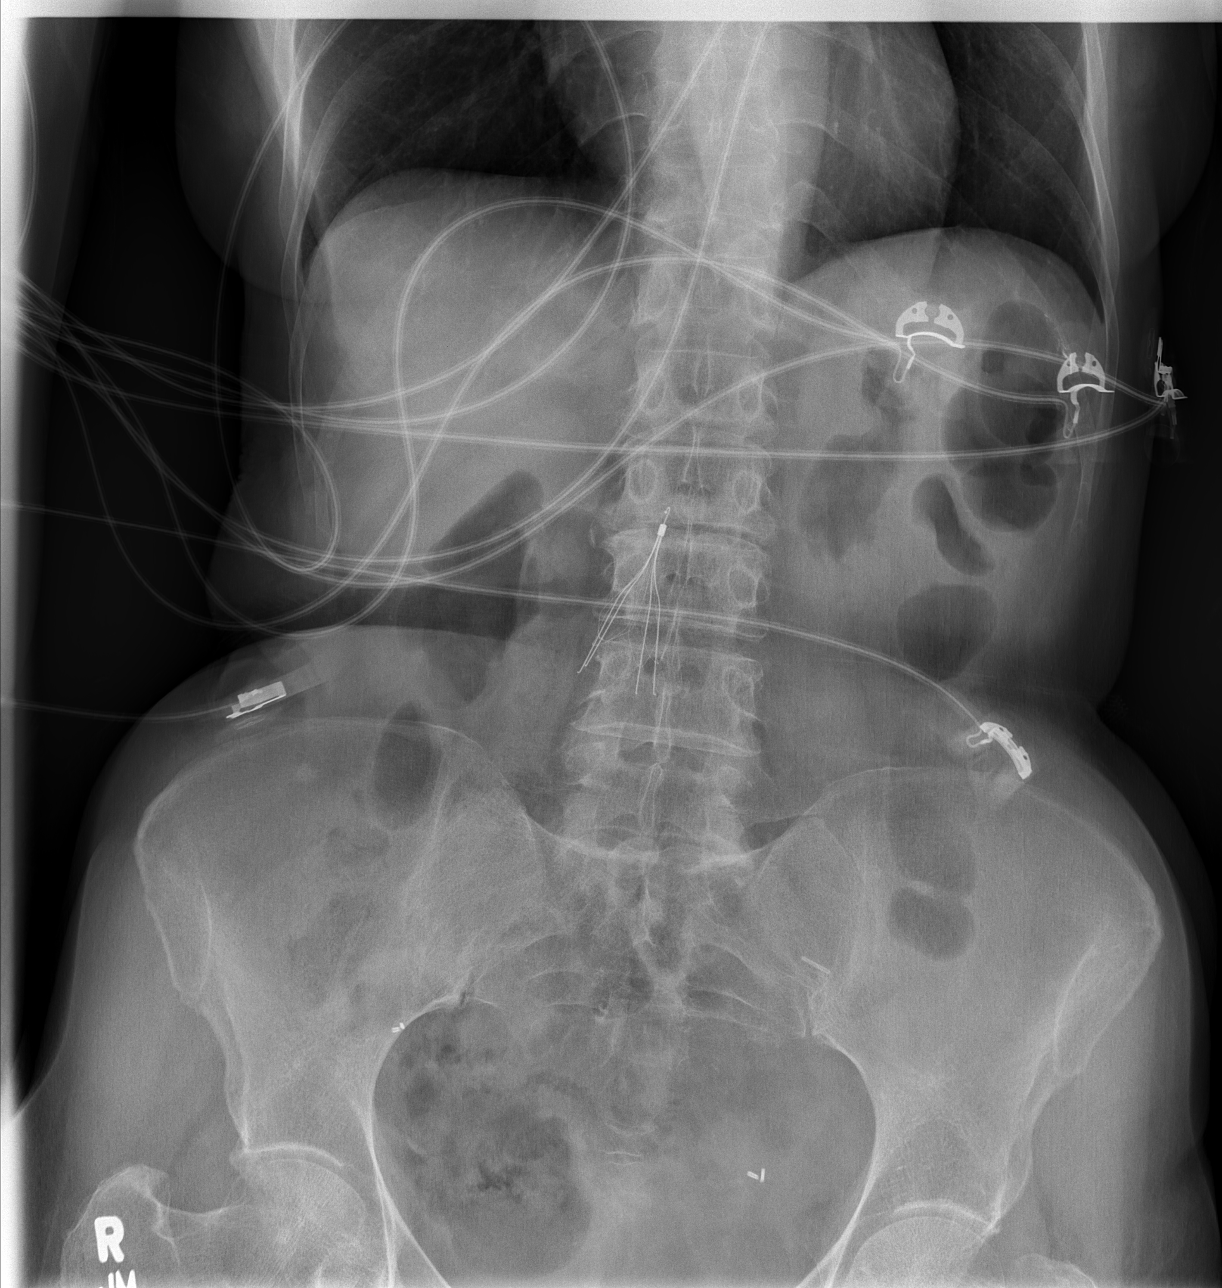

[t abdomen supine (2 of 2)]
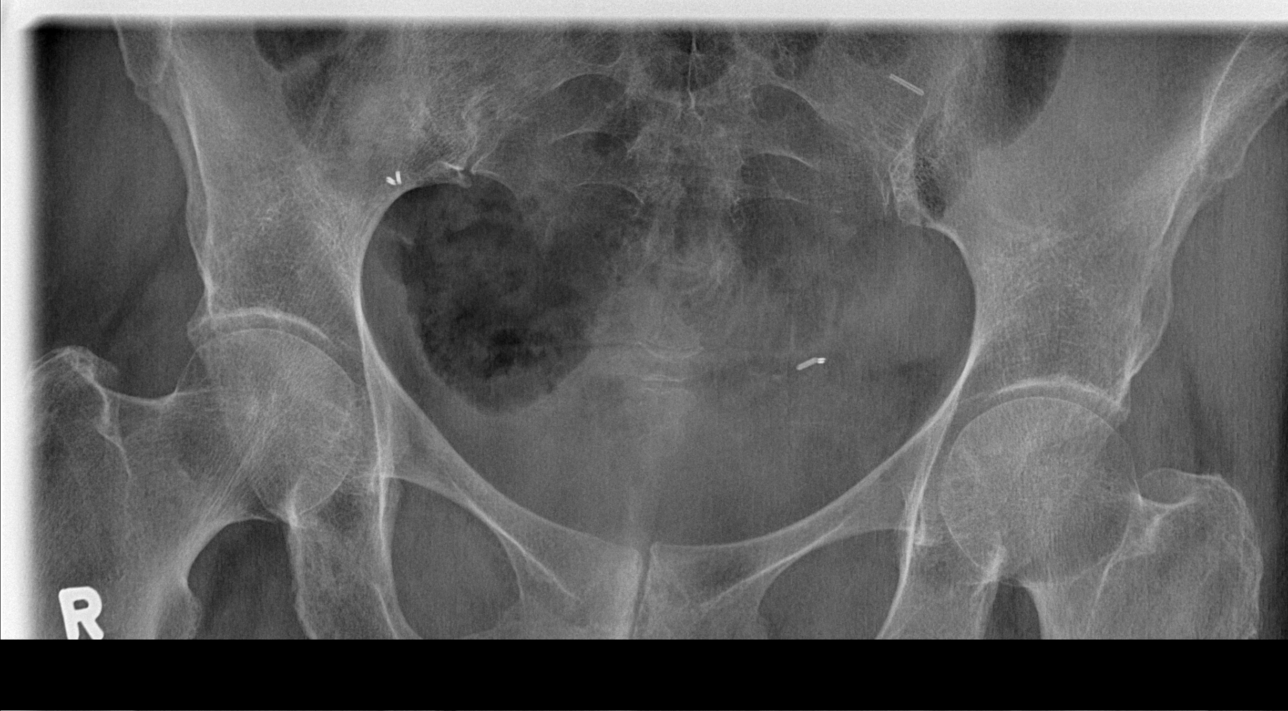

[4 of 4 positions shown; findings below may reference images not displayed]

FINDINGS: No active chest disease.  There is a suture anchor in the
right humeral head.  The bowel gas pattern is unremarkable.  No
free air.  IVC filter is appreciated.  Metallic surgical clips in
the pelvis.
IMPRESSION: No acute chest or abdominal findings.

## 2010-02-07 ENCOUNTER — Other Ambulatory Visit: Payer: Self-pay | Admitting: Internal Medicine

## 2010-02-11 ENCOUNTER — Ambulatory Visit
Admission: RE | Admit: 2010-02-11 | Discharge: 2010-02-11 | Disposition: A | Discharge status: Home / Self Care | Payer: Medicare Other | Source: Ambulatory Visit | Attending: Internal Medicine | Admitting: Internal Medicine

## 2010-02-14 ENCOUNTER — Emergency Department (HOSPITAL_COMMUNITY): Payer: Medicare Other

## 2010-02-14 ENCOUNTER — Inpatient Hospital Stay (HOSPITAL_COMMUNITY)
Admission: EM | Admit: 2010-02-14 | Discharge: 2010-02-20 | DRG: 391 | Disposition: A | Discharge status: Home Health | Payer: Medicare Other | Attending: Internal Medicine | Admitting: Internal Medicine

## 2010-02-14 DIAGNOSIS — R1312 Dysphagia, oropharyngeal phase: Secondary | ICD-10-CM | POA: Diagnosis present

## 2010-02-14 DIAGNOSIS — K2289 Other specified disease of esophagus: Secondary | ICD-10-CM | POA: Diagnosis present

## 2010-02-14 DIAGNOSIS — K219 Gastro-esophageal reflux disease without esophagitis: Secondary | ICD-10-CM | POA: Diagnosis present

## 2010-02-14 DIAGNOSIS — E876 Hypokalemia: Secondary | ICD-10-CM | POA: Diagnosis present

## 2010-02-14 DIAGNOSIS — Z86718 Personal history of other venous thrombosis and embolism: Secondary | ICD-10-CM

## 2010-02-14 DIAGNOSIS — Z8542 Personal history of malignant neoplasm of other parts of uterus: Secondary | ICD-10-CM

## 2010-02-14 DIAGNOSIS — E860 Dehydration: Secondary | ICD-10-CM | POA: Diagnosis present

## 2010-02-14 DIAGNOSIS — K589 Irritable bowel syndrome without diarrhea: Secondary | ICD-10-CM | POA: Diagnosis present

## 2010-02-14 DIAGNOSIS — K228 Other specified diseases of esophagus: Secondary | ICD-10-CM | POA: Diagnosis present

## 2010-02-14 DIAGNOSIS — Z681 Body mass index (BMI) 19 or less, adult: Secondary | ICD-10-CM

## 2010-02-14 DIAGNOSIS — Z742 Need for assistance at home and no other household member able to render care: Secondary | ICD-10-CM

## 2010-02-14 DIAGNOSIS — F341 Dysthymic disorder: Secondary | ICD-10-CM | POA: Diagnosis present

## 2010-02-14 DIAGNOSIS — K3189 Other diseases of stomach and duodenum: Principal | ICD-10-CM | POA: Diagnosis present

## 2010-02-14 DIAGNOSIS — R627 Adult failure to thrive: Secondary | ICD-10-CM | POA: Diagnosis present

## 2010-02-14 DIAGNOSIS — R630 Anorexia: Secondary | ICD-10-CM | POA: Diagnosis present

## 2010-02-14 DIAGNOSIS — E43 Unspecified severe protein-calorie malnutrition: Secondary | ICD-10-CM | POA: Diagnosis present

## 2010-02-14 HISTORY — DX: Malignant neoplasm of endometrium: C54.1

## 2010-02-14 LAB — URINALYSIS, ROUTINE W REFLEX MICROSCOPIC
Ketones, ur: 40 mg/dL — AB
Nitrite: NEGATIVE
Protein, ur: NEGATIVE mg/dL

## 2010-02-14 LAB — COMPREHENSIVE METABOLIC PANEL
ALT: 20 U/L (ref 0–35)
Alkaline Phosphatase: 50 U/L (ref 39–117)
BUN: 14 mg/dL (ref 6–23)
CO2: 28 mEq/L (ref 19–32)
Chloride: 103 mEq/L (ref 96–112)
GFR calc non Af Amer: 48 mL/min — ABNORMAL LOW (ref >60)
Glucose, Bld: 82 mg/dL (ref 70–99)
Potassium: 3.5 mEq/L (ref 3.5–5.1)
Sodium: 142 mEq/L (ref 135–145)
Total Bilirubin: 1.7 mg/dL — ABNORMAL HIGH (ref 0.3–1.2)

## 2010-02-14 LAB — CBC
HCT: 38 % (ref 36.0–46.0)
Hemoglobin: 12.8 g/dL (ref 12.0–15.0)
MCV: 90.5 fL (ref 78.0–100.0)
RBC: 4.2 MIL/uL (ref 3.87–5.11)
RDW: 12.5 % (ref 11.5–15.5)
WBC: 3.6 10*3/uL — ABNORMAL LOW (ref 4.0–10.5)

## 2010-02-14 LAB — DIFFERENTIAL
Eosinophils Relative: 0 % (ref 0–5)
Lymphocytes Relative: 17 % (ref 12–46)
Lymphs Abs: 0.6 10*3/uL — ABNORMAL LOW (ref 0.7–4.0)
Neutro Abs: 2.7 10*3/uL (ref 1.7–7.7)

## 2010-02-14 LAB — LIPASE, BLOOD: Lipase: 25 U/L (ref 11–59)

## 2010-02-14 LAB — PROTIME-INR: INR: 4.39 — ABNORMAL HIGH (ref 0.00–1.49)

## 2010-02-14 LAB — URINE MICROSCOPIC-ADD ON

## 2010-02-15 LAB — PROTIME-INR
INR: 4.77 — ABNORMAL HIGH (ref 0.00–1.49)
Prothrombin Time: 44.6 seconds — ABNORMAL HIGH (ref 11.6–15.2)

## 2010-02-16 LAB — PROTIME-INR: INR: 3.13 — ABNORMAL HIGH (ref 0.00–1.49)

## 2010-02-16 NOTE — H&P (Signed)
[name], [name]               ACCOUNT NO.:  [id number]  MEDICAL RECORD NO.:  [id number]           PATIENT TYPE:  E  LOCATION:  WLED                         FACILITY:  [facility]  PHYSICIAN:  [name], MDDATE OF BIRTH:  February 19, 1934  DATE OF ADMISSION:  02/14/2010 DATE OF DISCHARGE:                             HISTORY & PHYSICAL   PRIMARY CARE PHYSICIAN:  Deborah Churn, MD  CHIEF COMPLAINT:  Unable to eat and losing significant weight, and unable to take care of herself, for placement.  HISTORY OF PRESENT ILLNESS:  [name] is a 76 year old female who came to the hospital for the above-mentioned issues. As per her history she states that she tries to eat food one piece and she feels that it is getting stuck in her food pipe in the middle of her chest and then she stops eating.  As per the sisters, this is what she does and that is why she has lost a lot of weight.  Also, she has prior history of chronic diarrhea, which I believe must be possibly neuropathy due to old age, for which she has been on medication of Imodium all the time.  Also to note that she is on lorazepam also.  I believe that her dysphagia is compounded by effects of neuropathy as well as due to the effects of lorazepam and Imodium.  However, she has lost lot of weight. She has her husband at home who is suffering from pancreatic cancer and so she wants herself to be placed in some facility depending on her eligibility.  She cannot take care of herself and there is no one else to take care of her.  PAST HISTORY: 1. History of gastroesophageal reflux disease. 2. Oropharyngeal dysphagia. 3. Chronic diarrheal syndrome. 4. History of endometrial cancer stage IIIC with no obvious     recurrence. 5. History of pulmonary embolism and DVT with IVC filter placement;     currently, the patient states she is not on Coumadin. 6. Chronic anorexia. 7. History of having MBS swallow study as  outpatient; report not     available at this time. 8. X-ray study on February 11, 2010, with esophagogram showing normal     esophagogram with mild presbyesophagus.  CURRENT MEDICATIONS:  Lorazepam, vitamin D supplement, Dexilant,  clonazepam, Klor-Con, and Coumadin.  SOCIAL HISTORY:  Lives at home with her husband.  Her son is the healthcare power of attorney.  His name is [name], phone number [phone] and her daughter named [name] is at phone number [phone].  ADVANCED DIRECTIVES:  She has a Living Will but I spoke to the son who has it and he is not able to tell what the exact contents of it are, and I mentioned to him that until it comes, she will be considered Full Code and further verification will be done by Dr. Kevan Ny in the a.m.  FAMILY HISTORY:  Mother lived until 24.  Father had MI at 29.  She has 14 siblings, with history of cancer positive in family.  REVIEW OF SYSTEMS:  A 12-point system review unremarkable.  Positive pertinent features  in the history of present illness, negative otherwise.  PHYSICAL EXAMINATION:  VITAL SIGNS:  Temperature 98.0, heart rate 97, respirations 18, blood pressure 116/82, O2 saturation 99% on room air. GENERAL:  The patient is awake and alert, very weak but speaks clearly, not in any obvious distress.  Afebrile.  The patient's body structure shows significant emaciation HEAD AND NECK:  Loss of buccal pad of fat.  No bleeding seen in oral and nasal mucosa.  No JVD.  No bruit. CHEST:  Bilateral air entry is good anteriorly and posteriorly.  The rib spaces are clearly seen due to loss of chest wall fat.  Normal breath sounds. CARDIOVASCULAR:  S1, S2 regular.  No murmurs. ABDOMEN:  Soft, scaphoid, nontender.  No bowel sounds positive. EXTREMITIES:  No pedal edema. NEUROLOGIC:  Moves all four extremities well.  No obvious motor or sensory deficit.  PERTINENT LABORATORY AND X-RAY DATA:  X-ray abdomen shows no acute abdominal findings.   No cardiopulmonary findings on the x-ray chest. UA:  Small leukocyte esterase and WBC 3-6 and few bacteria; positive for bacteria, pyuria. Lipase 25.  BMP:  Sodium 142, potassium 3.5, chloride 105, bicarb 28, glucose 82, BUN 14, creatinine 1.12 and calcium 10.0. LFTs:  Total bili 1.7, alk phos 50, AST 26, ALT, total protein 7.5, albumin 4.2. CBC:  WBC 3.3, hemoglobin 12.8, hematocrit 38, platelets 183. PT/INR has not been done so far.  ASSESSMENT: 1. Failure to thrive secondary to: (a) decreased p.o. intake, (b) subjective dysphagia possibly secondary to decreased esophageal motility; even though esophagram was normal, (c) possible oropharyngeal dysphagia. 1. Significant protein-calorie nutrition. 2. History of endometrial carcinoma with no recurrence noted until     recently. 3. History of the deep venous thrombosis, pulmonary embolism, status     post IVC filter; not on Coumadin as per the patient. 4. Slightly elevated creatinine levels possibly due to dehydration.  PLAN: 1. At this time, we will check a PT/INR stat.  Other than that, I     would start the patient on gentle IV fluids for 12 hours.  I will     start her on Reglan 5 mg IV q.8 hourly which would enhance a little     bit of esophageal motility.  In this case, I think that it is     mainly the  combination of existing mild esophageal dysmotility     associated with some effect of Imodium and lorazepam which are     doing the trick.  So, I will stop those medications at this time.     To prevent benzodiazepine (Diazepam) withdrawal, we will give her     given Xanax p.r.n. b.i.d. dosing.  Will give Ensure and protein     supplements.  A nutrition consult will be called. 2. With regards to placement, will get a PT, OT evaluation, with fall     precautions.  A CSW consult will be called with regards to     placement to assisted living or skilled nursing facility as the     patient cannot take care of herself. 3. DVT  prophylaxis with Lovenox.  DISPOSITION:  Admit to regular floor under observation status.  Dr. Johnella [name] will take over care of the patient in the a.m.     Deborah Massed, MD     UT/MEDQ  D:  02/14/2010  T:  02/14/2010  Job:  [id number]  Electronically Signed by Deborah Massed MD on 02/15/2010 11:47:18 AM

## 2010-02-17 ENCOUNTER — Inpatient Hospital Stay (HOSPITAL_COMMUNITY): Payer: Medicare Other

## 2010-02-17 ENCOUNTER — Encounter (HOSPITAL_COMMUNITY): Payer: Self-pay | Admitting: Radiology

## 2010-02-17 LAB — PROTIME-INR: Prothrombin Time: 33.4 seconds — ABNORMAL HIGH (ref 11.6–15.2)

## 2010-02-17 MED ORDER — IOHEXOL 300 MG/ML  SOLN
100.0000 mL | Freq: Once | INTRAMUSCULAR | Status: AC | PRN
  Administered 2010-02-17: 100 mL via INTRAVENOUS

## 2010-02-18 LAB — CBC
HCT: 36.6 % (ref 36.0–46.0)
Hemoglobin: 12.7 g/dL (ref 12.0–15.0)
MCV: 89.3 fL (ref 78.0–100.0)
RDW: 12.3 % (ref 11.5–15.5)
WBC: 3.3 10*3/uL — ABNORMAL LOW (ref 4.0–10.5)

## 2010-02-18 LAB — COMPREHENSIVE METABOLIC PANEL
ALT: 15 U/L (ref 0–35)
Alkaline Phosphatase: 44 U/L (ref 39–117)
BUN: 2 mg/dL — ABNORMAL LOW (ref 6–23)
CO2: 30 mEq/L (ref 19–32)
GFR calc non Af Amer: 58 mL/min — ABNORMAL LOW (ref >60)
Glucose, Bld: 110 mg/dL — ABNORMAL HIGH (ref 70–99)
Potassium: 3.2 mEq/L — ABNORMAL LOW (ref 3.5–5.1)
Sodium: 143 mEq/L (ref 135–145)
Total Bilirubin: 2 mg/dL — ABNORMAL HIGH (ref 0.3–1.2)

## 2010-02-18 LAB — PREALBUMIN: Prealbumin: 16 mg/dL — ABNORMAL LOW (ref 17.0–34.0)

## 2010-02-18 LAB — PROTIME-INR: INR: 2.44 — ABNORMAL HIGH (ref 0.00–1.49)

## 2010-02-19 ENCOUNTER — Inpatient Hospital Stay (HOSPITAL_COMMUNITY): Payer: Medicare Other

## 2010-02-19 LAB — PROTIME-INR: Prothrombin Time: 20.8 seconds — ABNORMAL HIGH (ref 11.6–15.2)

## 2010-02-19 MED ORDER — GADOBENATE DIMEGLUMINE 529 MG/ML IV SOLN: 10.0000 mL | Freq: Once | INTRAVENOUS | Status: AC | PRN

## 2010-02-20 LAB — BASIC METABOLIC PANEL
BUN: 5 mg/dL — ABNORMAL LOW (ref 6–23)
CO2: 28 mEq/L (ref 19–32)
Chloride: 108 mEq/L (ref 96–112)
Creatinine, Ser: 0.9 mg/dL (ref 0.4–1.2)
Glucose, Bld: 77 mg/dL (ref 70–99)
Potassium: 3.5 mEq/L (ref 3.5–5.1)

## 2010-02-26 NOTE — Discharge Summary (Signed)
  NAMESUHAILA, TROIANO               ACCOUNT NO.:  [id number]  MEDICAL RECORD NO.:  [id number]           PATIENT TYPE:  I  LOCATION:  1340                         FACILITY:  [facility]  PHYSICIAN:  Candyce Churn, M.D.DATE OF BIRTH:  11/25/1934  DATE OF ADMISSION:  02/14/2010 DATE OF DISCHARGE:  02/20/2010                              DISCHARGE SUMMARY   DISCHARGE DIAGNOSES: 1. Chronic dyspepsia, etiology elusive.  Symptomatically improved on     current medication regimen. 2. History of gastroesophageal reflux disease. 3. Oropharyngeal dysphagia with presbyesophagus. 4. Chronic diarrheal syndrome likely secondary to irritable bowel. 5. History of endometrial cancer stage IIIC with no obvious     recurrence. 6. History of pulmonary embolism and DVT with IVC filter placement and     Coumadin therapy. 7. Chronic anorexia, possibly psychological. 8. Hypokalemia, currently controlled, possibly secondary to loose     stools.  DISCHARGE MEDICATIONS: Dictation ended at this point.     Candyce Churn, M.D.     RNG/MEDQ  D:  02/20/2010  T:  02/20/2010  Job:  [id number]  Electronically Signed by Marden Noble M.D. on 02/26/2010 08:17:57 PM

## 2010-02-26 NOTE — Discharge Summary (Signed)
NAMECARLENA, [name]               ACCOUNT NO.:  [id number]  MEDICAL RECORD NO.:  [id number]           PATIENT TYPE:  I  LOCATION:  1340                         FACILITY:  [facility]  PHYSICIAN:  Candyce Churn, M.D.DATE OF BIRTH:  30-Aug-1934  DATE OF ADMISSION:  02/14/2010 DATE OF DISCHARGE:  02/20/2010                              DISCHARGE SUMMARY   ADDENDUM:  DISCHARGE MEDICATIONS: 1. Librax 1 capsule p.o. b.i.d. 2. Pepcid 20 mg b.i.d. 3. Lorazepam 1 mg b.i.d. 4. Mirtazapine 15 mg 1 p.o. at bedtime. 5. Ultram 50 mg 1/2 tablet t.i.d. p.r.n. pain. 6. Potassium chloride 20 mEq b.i.d. 7. Warfarin 4 mg 1-1/2 tablets daily. 8. Dexilant 60 mg daily. 9. Vitamin D2, 50,000 units weekly and vitamin D3, 1000 units 1 tablet     or capsule daily.  PROCEDURES: 1. Abdominal CT scan on February 17, 2010, revealed a trace of     intraperitoneal free fluid with some vascular engorgement and     mesenteric edema associated with small bowel mesentery in the     pelvis.  No associated small bowel wall thickening, small-bowel     obstruction, or overt interloop mesenteric fluid.  Findings are     nonspecific.  Otherwise, no acute changes.  No evidence of     metastatic disease or tumor. 2. Abdominal x-ray on February 14, 2010, revealed no acute abdominal     or cardiopulmonary findings.  No evidence of bowel obstruction or     metastatic disease. 3. Esophagram on February 11, 2010, revealed mild presbyesophagus,     otherwise normal.  CONSULTATIONS:  GI, Dr. Vida Rigger who recommended Librax for bowel relaxation and recommended an MRA to rule out intestinal ischemia and results are pending.  Also recommended to discontinue Reglan and possible appetite stimulant.  HOSPITAL COURSE:  [name] is a pleasant, chronically anxious 76 year old female who presented to the emergency room with weight loss and decreased p.o. intake.  She kept thinking that the food was getting stuck in  her esophagus and that she might have a stricture.  She also tended to concentrate on an abdominal scar that was infraumbilical and she thought it was contributing to her chronic abdominal pain and decreased appetite.  She was admitted, started on IV fluids and medications were changed to the above regimen on discharge.  With these changes, she has started eating better and she is less anxious and sleeping well.  Weight loss is likely multifactorial with strong psychological component.  We will be following up on MRA to make sure she does not have intestinal ischemia.  She was reassured that she did not have any recurrent cancer and hopefully with the above medical regimen, her mood will be better, anxiety will be less and her appetite will improve.  We will plan to have home health and skilled nursing follow up through [facility] who had seen her in the past and continue physical therapy and occupational therapy as well as follow up for problems with anorexia and weight loss.  CONDITION ON DISCHARGE:  Improved.     Candyce Churn, M.D.  RNG/MEDQ  D:  02/20/2010  T:  02/20/2010  Job:  [id number]  Electronically Signed by Marden Noble M.D. on 02/26/2010 08:17:46 PM

## 2010-03-20 LAB — DIFFERENTIAL
Basophils Absolute: 0 10*3/uL (ref 0.0–0.1)
Basophils Relative: 0 % (ref 0–1)
Monocytes Relative: 7 % (ref 3–12)
Neutro Abs: 2 10*3/uL (ref 1.7–7.7)
Neutrophils Relative %: 72 % (ref 43–77)

## 2010-03-20 LAB — CBC
Hemoglobin: 12.8 g/dL (ref 12.0–15.0)
MCHC: 34 g/dL (ref 30.0–36.0)
RDW: 13.2 % (ref 11.5–15.5)

## 2010-03-20 LAB — APTT: aPTT: 29 seconds (ref 24–37)

## 2010-03-20 LAB — PROTIME-INR: INR: 1.12 (ref 0.00–1.49)

## 2010-03-23 LAB — COMPREHENSIVE METABOLIC PANEL
Albumin: 3.2 g/dL — ABNORMAL LOW (ref 3.5–5.2)
BUN: 7 mg/dL (ref 6–23)
CO2: 27 mEq/L (ref 19–32)
Chloride: 108 mEq/L (ref 96–112)
Creatinine, Ser: 0.77 mg/dL (ref 0.4–1.2)
GFR calc non Af Amer: >60 mL/min (ref >60)
Glucose, Bld: 77 mg/dL (ref 70–99)
Total Bilirubin: 1.7 mg/dL — ABNORMAL HIGH (ref 0.3–1.2)

## 2010-03-23 LAB — BASIC METABOLIC PANEL
BUN: 5 mg/dL — ABNORMAL LOW (ref 6–23)
Calcium: 9.1 mg/dL (ref 8.4–10.5)
Calcium: 9.7 mg/dL (ref 8.4–10.5)
Creatinine, Ser: 0.83 mg/dL (ref 0.4–1.2)
GFR calc Af Amer: >60 mL/min (ref >60)
GFR calc non Af Amer: >60 mL/min (ref >60)
GFR calc non Af Amer: >60 mL/min (ref >60)
Glucose, Bld: 129 mg/dL — ABNORMAL HIGH (ref 70–99)
Potassium: 3.9 mEq/L (ref 3.5–5.1)
Sodium: 140 mEq/L (ref 135–145)
Sodium: 143 mEq/L (ref 135–145)

## 2010-03-23 LAB — CBC
HCT: 33.6 % — ABNORMAL LOW (ref 36.0–46.0)
Hemoglobin: 11.1 g/dL — ABNORMAL LOW (ref 12.0–15.0)
MCH: 32.4 pg (ref 26.0–34.0)
MCHC: 33.6 g/dL (ref 30.0–36.0)
MCHC: 33.9 g/dL (ref 30.0–36.0)
MCV: 93.8 fL (ref 78.0–100.0)
Platelets: 201 10*3/uL (ref 150–400)
Platelets: 204 10*3/uL (ref 150–400)
RBC: 3.48 MIL/uL — ABNORMAL LOW (ref 3.87–5.11)
RBC: 3.58 MIL/uL — ABNORMAL LOW (ref 3.87–5.11)
RDW: 13.2 % (ref 11.5–15.5)

## 2010-03-23 LAB — POCT I-STAT, CHEM 8
BUN: 10 mg/dL (ref 6–23)
Creatinine, Ser: 1 mg/dL (ref 0.4–1.2)
Sodium: 143 mEq/L (ref 135–145)
TCO2: 27 mmol/L (ref 0–100)

## 2010-03-23 LAB — DIFFERENTIAL
Basophils Absolute: 0 10*3/uL (ref 0.0–0.1)
Basophils Absolute: 0 10*3/uL (ref 0.0–0.1)
Basophils Relative: 0 % (ref 0–1)
Basophils Relative: 0 % (ref 0–1)
Lymphocytes Relative: 18 % (ref 12–46)
Neutro Abs: 3.1 10*3/uL (ref 1.7–7.7)
Neutro Abs: 3.3 10*3/uL (ref 1.7–7.7)
Neutrophils Relative %: 75 % (ref 43–77)
Neutrophils Relative %: 79 % — ABNORMAL HIGH (ref 43–77)

## 2010-03-23 LAB — PROTIME-INR
INR: 2.78 — ABNORMAL HIGH (ref 0.00–1.49)
INR: 2.96 — ABNORMAL HIGH (ref 0.00–1.49)
INR: 4.36 — ABNORMAL HIGH (ref 0.00–1.49)
Prothrombin Time: 30.6 seconds — ABNORMAL HIGH (ref 11.6–15.2)
Prothrombin Time: 41.4 seconds — ABNORMAL HIGH (ref 11.6–15.2)

## 2010-03-23 LAB — URINALYSIS, ROUTINE W REFLEX MICROSCOPIC
Glucose, UA: NEGATIVE mg/dL
Nitrite: NEGATIVE
Protein, ur: NEGATIVE mg/dL

## 2010-04-11 LAB — PROTIME-INR
INR: 2.4 — ABNORMAL HIGH (ref 0.00–1.49)
INR: 3.2 — ABNORMAL HIGH (ref 0.00–1.49)
INR: 5 — ABNORMAL HIGH (ref 0.00–1.49)
INR: 3.7 — ABNORMAL HIGH (ref 0.00–1.49)
Prothrombin Time: 23.7 seconds — ABNORMAL HIGH (ref 11.6–15.2)
Prothrombin Time: 32.3 seconds — ABNORMAL HIGH (ref 11.6–15.2)
Prothrombin Time: 46.3 s — ABNORMAL HIGH (ref 11.6–15.2)
Prothrombin Time: 30.2 seconds — ABNORMAL HIGH (ref 11.6–15.2)
Prothrombin Time: 31.2 seconds — ABNORMAL HIGH (ref 11.6–15.2)

## 2010-04-11 LAB — POCT I-STAT, CHEM 8
BUN: <3 mg/dL — ABNORMAL LOW (ref 6–23)
BUN: <3 mg/dL — ABNORMAL LOW (ref 6–23)
Calcium, Ion: 0.95 mmol/L — ABNORMAL LOW (ref 1.12–1.32)
Calcium, Ion: 1.13 mmol/L (ref 1.12–1.32)
Chloride: 103 mEq/L (ref 96–112)
Chloride: 105 mEq/L (ref 96–112)
Creatinine, Ser: 0.9 mg/dL (ref 0.4–1.2)
Creatinine, Ser: 1 mg/dL (ref 0.4–1.2)
Glucose, Bld: 68 mg/dL — ABNORMAL LOW (ref 70–99)
Glucose, Bld: 76 mg/dL (ref 70–99)
HCT: 35 % — ABNORMAL LOW (ref 36.0–46.0)
HCT: 38 % (ref 36.0–46.0)
Hemoglobin: 11.9 g/dL — ABNORMAL LOW (ref 12.0–15.0)
Hemoglobin: 12.9 g/dL (ref 12.0–15.0)
Potassium: 2.6 mEq/L — CL (ref 3.5–5.1)
Potassium: 2.7 mEq/L — CL (ref 3.5–5.1)
Sodium: 138 mEq/L (ref 135–145)
Sodium: 143 mEq/L (ref 135–145)
TCO2: 24 mmol/L (ref 0–100)
TCO2: 24 mmol/L (ref 0–100)

## 2010-04-11 LAB — COMPREHENSIVE METABOLIC PANEL
AST: 29 U/L (ref 0–37)
Albumin: 3.1 g/dL — ABNORMAL LOW (ref 3.5–5.2)
BUN: 3 mg/dL — ABNORMAL LOW (ref 6–23)
Calcium: 9.1 mg/dL (ref 8.4–10.5)
Creatinine, Ser: 0.77 mg/dL (ref 0.4–1.2)
GFR calc Af Amer: >60 mL/min (ref >60)
Total Bilirubin: 0.6 mg/dL (ref 0.3–1.2)
Total Protein: 6.6 g/dL (ref 6.0–8.3)

## 2010-04-11 LAB — BASIC METABOLIC PANEL
BUN: <1 mg/dL — ABNORMAL LOW (ref 6–23)
CO2: 25 mEq/L (ref 19–32)
CO2: 25 mEq/L (ref 19–32)
Calcium: 9.1 mg/dL (ref 8.4–10.5)
Calcium: 9.7 mg/dL (ref 8.4–10.5)
Creatinine, Ser: 0.67 mg/dL (ref 0.4–1.2)
GFR calc Af Amer: >60 mL/min (ref >60)
GFR calc Af Amer: >60 mL/min (ref >60)
GFR calc non Af Amer: >60 mL/min (ref >60)
GFR calc non Af Amer: >60 mL/min (ref >60)
GFR calc non Af Amer: >60 mL/min (ref >60)
Glucose, Bld: 99 mg/dL (ref 70–99)
Potassium: 4.6 mEq/L (ref 3.5–5.1)
Sodium: 138 mEq/L (ref 135–145)

## 2010-04-11 LAB — GLUCOSE, CAPILLARY
Glucose-Capillary: 68 mg/dL — ABNORMAL LOW (ref 70–99)
Glucose-Capillary: 87 mg/dL (ref 70–99)

## 2010-04-11 LAB — POCT CARDIAC MARKERS
CKMB, poc: <1 ng/mL — ABNORMAL LOW (ref 1.0–8.0)
Myoglobin, poc: 98.4 ng/mL (ref 12–200)
Troponin i, poc: <0.05 ng/mL (ref 0.00–0.09)

## 2010-04-11 LAB — MAGNESIUM
Magnesium: 1.8 mg/dL (ref 1.5–2.5)
Magnesium: 2 mg/dL (ref 1.5–2.5)

## 2010-04-11 LAB — DIFFERENTIAL
Basophils Absolute: 0 10*3/uL (ref 0.0–0.1)
Eosinophils Relative: 0 % (ref 0–5)
Lymphocytes Relative: 13 % (ref 12–46)
Lymphs Abs: 0.6 10*3/uL — ABNORMAL LOW (ref 0.7–4.0)
Neutro Abs: 3.8 10*3/uL (ref 1.7–7.7)

## 2010-04-11 LAB — GIARDIA/CRYPTOSPORIDIUM SCREEN(EIA)
Cryptosporidium Screen (EIA): NEGATIVE
Giardia Screen - EIA: NEGATIVE

## 2010-04-11 LAB — CBC
HCT: 36.6 % (ref 36.0–46.0)
Platelets: 429 10*3/uL — ABNORMAL HIGH (ref 150–400)
RDW: 14 % (ref 11.5–15.5)
WBC: 4.8 10*3/uL (ref 4.0–10.5)

## 2010-04-11 LAB — URINALYSIS, ROUTINE W REFLEX MICROSCOPIC
Bilirubin Urine: NEGATIVE
Ketones, ur: 15 mg/dL — AB
Nitrite: NEGATIVE
Urobilinogen, UA: 0.2 mg/dL (ref 0.0–1.0)

## 2010-04-11 LAB — STOOL CULTURE

## 2010-04-11 LAB — CLOSTRIDIUM DIFFICILE EIA

## 2010-04-12 LAB — BASIC METABOLIC PANEL
BUN: 11 mg/dL (ref 6–23)
BUN: 10 mg/dL (ref 6–23)
BUN: 10 mg/dL (ref 6–23)
BUN: 11 mg/dL (ref 6–23)
BUN: 7 mg/dL (ref 6–23)
BUN: 7 mg/dL (ref 6–23)
CO2: 25 mEq/L (ref 19–32)
CO2: 28 mEq/L (ref 19–32)
CO2: 28 mEq/L (ref 19–32)
CO2: 28 mEq/L (ref 19–32)
Calcium: 9.5 mg/dL (ref 8.4–10.5)
Calcium: 8.1 mg/dL — ABNORMAL LOW (ref 8.4–10.5)
Calcium: 8.2 mg/dL — ABNORMAL LOW (ref 8.4–10.5)
Calcium: 8.3 mg/dL — ABNORMAL LOW (ref 8.4–10.5)
Calcium: 8.4 mg/dL (ref 8.4–10.5)
Calcium: 9.1 mg/dL (ref 8.4–10.5)
Calcium: 9.1 mg/dL (ref 8.4–10.5)
Calcium: 9.2 mg/dL (ref 8.4–10.5)
Chloride: 103 mEq/L (ref 96–112)
Chloride: 105 mEq/L (ref 96–112)
Creatinine, Ser: 0.89 mg/dL (ref 0.4–1.2)
Creatinine, Ser: 0.68 mg/dL (ref 0.4–1.2)
Creatinine, Ser: 0.69 mg/dL (ref 0.4–1.2)
Creatinine, Ser: 0.71 mg/dL (ref 0.4–1.2)
Creatinine, Ser: 0.94 mg/dL (ref 0.4–1.2)
GFR calc Af Amer: >60 mL/min (ref >60)
GFR calc Af Amer: >60 mL/min (ref >60)
GFR calc Af Amer: >60 mL/min (ref >60)
GFR calc Af Amer: >60 mL/min (ref >60)
GFR calc Af Amer: >60 mL/min (ref >60)
GFR calc non Af Amer: >60 mL/min (ref >60)
GFR calc non Af Amer: >60 mL/min (ref >60)
GFR calc non Af Amer: 59 mL/min — ABNORMAL LOW (ref >60)
GFR calc non Af Amer: >60 mL/min (ref >60)
GFR calc non Af Amer: >60 mL/min (ref >60)
Glucose, Bld: 119 mg/dL — ABNORMAL HIGH (ref 70–99)
Glucose, Bld: 163 mg/dL — ABNORMAL HIGH (ref 70–99)
Glucose, Bld: 240 mg/dL — ABNORMAL HIGH (ref 70–99)
Potassium: 3.2 mEq/L — ABNORMAL LOW (ref 3.5–5.1)
Potassium: 4.3 mEq/L (ref 3.5–5.1)
Potassium: 3.6 mEq/L (ref 3.5–5.1)
Sodium: 136 mEq/L (ref 135–145)
Sodium: 140 mEq/L (ref 135–145)
Sodium: 140 mEq/L (ref 135–145)
Sodium: 141 mEq/L (ref 135–145)

## 2010-04-12 LAB — BLOOD GAS, ARTERIAL
Acid-Base Excess: 5.2 mmol/L — ABNORMAL HIGH (ref 0.0–2.0)
Acid-base deficit: 0.3 mmol/L (ref 0.0–2.0)
Acid-base deficit: 3.3 mmol/L — ABNORMAL HIGH (ref 0.0–2.0)
Bicarbonate: 20.4 mEq/L (ref 20.0–24.0)
Bicarbonate: 29.2 mEq/L — ABNORMAL HIGH (ref 20.0–24.0)
Drawn by: 129801
Drawn by: 232811
Drawn by: 232811
Drawn by: 232811
FIO2: 0.3 %
FIO2: 1 %
MECHVT: 400 mL
MECHVT: 500 mL
O2 Content: 4 L/min
O2 Saturation: 98.9 %
O2 Saturation: 99.5 %
O2 Saturation: 99.7 %
O2 Saturation: 99.7 %
PEEP: 5 cmH2O
PEEP: 5 cmH2O
RATE: 10 resp/min
RATE: 14 resp/min
RATE: 20 resp/min
pCO2 arterial: 21 mmHg — ABNORMAL LOW (ref 35.0–45.0)
pCO2 arterial: 31.1 mmHg — ABNORMAL LOW (ref 35.0–45.0)
pCO2 arterial: 37.2 mmHg (ref 35.0–45.0)
pCO2 arterial: 42.4 mmHg (ref 35.0–45.0)
pH, Arterial: 7.598 — ABNORMAL HIGH (ref 7.350–7.400)
pO2, Arterial: 198 mmHg — ABNORMAL HIGH (ref 80.0–100.0)
pO2, Arterial: 208 mmHg — ABNORMAL HIGH (ref 80.0–100.0)

## 2010-04-12 LAB — DIFFERENTIAL
Eosinophils Absolute: 0 10*3/uL (ref 0.0–0.7)
Eosinophils Absolute: 0 10*3/uL (ref 0.0–0.7)
Eosinophils Relative: 0 % (ref 0–5)
Lymphocytes Relative: 20 % (ref 12–46)
Lymphocytes Relative: 12 % (ref 12–46)
Lymphs Abs: 0.8 10*3/uL (ref 0.7–4.0)
Lymphs Abs: 0.8 10*3/uL (ref 0.7–4.0)
Monocytes Absolute: 0.5 10*3/uL (ref 0.1–1.0)
Monocytes Relative: 11 % (ref 3–12)
Neutro Abs: 5.5 10*3/uL (ref 1.7–7.7)
Neutrophils Relative %: 78 % — ABNORMAL HIGH (ref 43–77)

## 2010-04-12 LAB — COMPREHENSIVE METABOLIC PANEL
ALT: 87 U/L — ABNORMAL HIGH (ref 0–35)
ALT: 11 U/L (ref 0–35)
ALT: 21 U/L (ref 0–35)
AST: 49 U/L — ABNORMAL HIGH (ref 0–37)
AST: 21 U/L (ref 0–37)
AST: 22 U/L (ref 0–37)
Albumin: 3.1 g/dL — ABNORMAL LOW (ref 3.5–5.2)
Albumin: 2.3 g/dL — ABNORMAL LOW (ref 3.5–5.2)
Alkaline Phosphatase: 60 U/L (ref 39–117)
Alkaline Phosphatase: 83 U/L (ref 39–117)
BUN: 7 mg/dL (ref 6–23)
BUN: 8 mg/dL (ref 6–23)
BUN: 9 mg/dL (ref 6–23)
CO2: 19 mEq/L (ref 19–32)
CO2: 25 mEq/L (ref 19–32)
CO2: 27 mEq/L (ref 19–32)
Calcium: 9.6 mg/dL (ref 8.4–10.5)
Calcium: 8.9 mg/dL (ref 8.4–10.5)
Calcium: 9 mg/dL (ref 8.4–10.5)
Chloride: 102 mEq/L (ref 96–112)
Chloride: 106 mEq/L (ref 96–112)
Chloride: 107 mEq/L (ref 96–112)
Creatinine, Ser: 0.84 mg/dL (ref 0.4–1.2)
Creatinine, Ser: 0.71 mg/dL (ref 0.4–1.2)
Creatinine, Ser: 0.78 mg/dL (ref 0.4–1.2)
Creatinine, Ser: 0.84 mg/dL (ref 0.4–1.2)
GFR calc Af Amer: >60 mL/min (ref >60)
GFR calc Af Amer: >60 mL/min (ref >60)
GFR calc Af Amer: >60 mL/min (ref >60)
GFR calc non Af Amer: >60 mL/min (ref >60)
GFR calc non Af Amer: >60 mL/min (ref >60)
GFR calc non Af Amer: >60 mL/min (ref >60)
GFR calc non Af Amer: >60 mL/min (ref >60)
GFR calc non Af Amer: >60 mL/min (ref >60)
Glucose, Bld: 125 mg/dL — ABNORMAL HIGH (ref 70–99)
Glucose, Bld: 129 mg/dL — ABNORMAL HIGH (ref 70–99)
Glucose, Bld: 89 mg/dL (ref 70–99)
Potassium: 2.7 mEq/L — CL (ref 3.5–5.1)
Potassium: 3.7 mEq/L (ref 3.5–5.1)
Potassium: 3.7 mEq/L (ref 3.5–5.1)
Sodium: 135 mEq/L (ref 135–145)
Sodium: 136 mEq/L (ref 135–145)
Sodium: 139 mEq/L (ref 135–145)
Total Bilirubin: 0.4 mg/dL (ref 0.3–1.2)
Total Bilirubin: 0.5 mg/dL (ref 0.3–1.2)
Total Bilirubin: 1.8 mg/dL — ABNORMAL HIGH (ref 0.3–1.2)
Total Protein: 6.9 g/dL (ref 6.0–8.3)

## 2010-04-12 LAB — GLUCOSE, CAPILLARY
Glucose-Capillary: 111 mg/dL — ABNORMAL HIGH (ref 70–99)
Glucose-Capillary: 115 mg/dL — ABNORMAL HIGH (ref 70–99)
Glucose-Capillary: 120 mg/dL — ABNORMAL HIGH (ref 70–99)
Glucose-Capillary: 120 mg/dL — ABNORMAL HIGH (ref 70–99)
Glucose-Capillary: 126 mg/dL — ABNORMAL HIGH (ref 70–99)
Glucose-Capillary: 127 mg/dL — ABNORMAL HIGH (ref 70–99)
Glucose-Capillary: 129 mg/dL — ABNORMAL HIGH (ref 70–99)
Glucose-Capillary: 141 mg/dL — ABNORMAL HIGH (ref 70–99)
Glucose-Capillary: 104 mg/dL — ABNORMAL HIGH (ref 70–99)
Glucose-Capillary: 106 mg/dL — ABNORMAL HIGH (ref 70–99)
Glucose-Capillary: 108 mg/dL — ABNORMAL HIGH (ref 70–99)
Glucose-Capillary: 111 mg/dL — ABNORMAL HIGH (ref 70–99)
Glucose-Capillary: 111 mg/dL — ABNORMAL HIGH (ref 70–99)
Glucose-Capillary: 114 mg/dL — ABNORMAL HIGH (ref 70–99)
Glucose-Capillary: 115 mg/dL — ABNORMAL HIGH (ref 70–99)
Glucose-Capillary: 118 mg/dL — ABNORMAL HIGH (ref 70–99)
Glucose-Capillary: 119 mg/dL — ABNORMAL HIGH (ref 70–99)
Glucose-Capillary: 120 mg/dL — ABNORMAL HIGH (ref 70–99)
Glucose-Capillary: 121 mg/dL — ABNORMAL HIGH (ref 70–99)
Glucose-Capillary: 122 mg/dL — ABNORMAL HIGH (ref 70–99)
Glucose-Capillary: 122 mg/dL — ABNORMAL HIGH (ref 70–99)
Glucose-Capillary: 125 mg/dL — ABNORMAL HIGH (ref 70–99)
Glucose-Capillary: 127 mg/dL — ABNORMAL HIGH (ref 70–99)
Glucose-Capillary: 127 mg/dL — ABNORMAL HIGH (ref 70–99)
Glucose-Capillary: 128 mg/dL — ABNORMAL HIGH (ref 70–99)
Glucose-Capillary: 130 mg/dL — ABNORMAL HIGH (ref 70–99)
Glucose-Capillary: 131 mg/dL — ABNORMAL HIGH (ref 70–99)
Glucose-Capillary: 131 mg/dL — ABNORMAL HIGH (ref 70–99)
Glucose-Capillary: 131 mg/dL — ABNORMAL HIGH (ref 70–99)
Glucose-Capillary: 133 mg/dL — ABNORMAL HIGH (ref 70–99)
Glucose-Capillary: 133 mg/dL — ABNORMAL HIGH (ref 70–99)
Glucose-Capillary: 137 mg/dL — ABNORMAL HIGH (ref 70–99)
Glucose-Capillary: 143 mg/dL — ABNORMAL HIGH (ref 70–99)
Glucose-Capillary: 144 mg/dL — ABNORMAL HIGH (ref 70–99)
Glucose-Capillary: 146 mg/dL — ABNORMAL HIGH (ref 70–99)
Glucose-Capillary: 146 mg/dL — ABNORMAL HIGH (ref 70–99)
Glucose-Capillary: 147 mg/dL — ABNORMAL HIGH (ref 70–99)
Glucose-Capillary: 148 mg/dL — ABNORMAL HIGH (ref 70–99)
Glucose-Capillary: 149 mg/dL — ABNORMAL HIGH (ref 70–99)
Glucose-Capillary: 157 mg/dL — ABNORMAL HIGH (ref 70–99)
Glucose-Capillary: 170 mg/dL — ABNORMAL HIGH (ref 70–99)
Glucose-Capillary: 175 mg/dL — ABNORMAL HIGH (ref 70–99)
Glucose-Capillary: 178 mg/dL — ABNORMAL HIGH (ref 70–99)
Glucose-Capillary: 180 mg/dL — ABNORMAL HIGH (ref 70–99)
Glucose-Capillary: 181 mg/dL — ABNORMAL HIGH (ref 70–99)
Glucose-Capillary: 190 mg/dL — ABNORMAL HIGH (ref 70–99)
Glucose-Capillary: 205 mg/dL — ABNORMAL HIGH (ref 70–99)
Glucose-Capillary: 224 mg/dL — ABNORMAL HIGH (ref 70–99)
Glucose-Capillary: 263 mg/dL — ABNORMAL HIGH (ref 70–99)

## 2010-04-12 LAB — PROTIME-INR
INR: 1.5 (ref 0.00–1.49)
INR: 1.7 — ABNORMAL HIGH (ref 0.00–1.49)
INR: 2.1 — ABNORMAL HIGH (ref 0.00–1.49)
INR: 2.8 — ABNORMAL HIGH (ref 0.00–1.49)
INR: 1.1 (ref 0.00–1.49)
INR: 1.2 (ref 0.00–1.49)
Prothrombin Time: 14.1 seconds (ref 11.6–15.2)
Prothrombin Time: 17.6 seconds — ABNORMAL HIGH (ref 11.6–15.2)
Prothrombin Time: 19.5 seconds — ABNORMAL HIGH (ref 11.6–15.2)
Prothrombin Time: 22.9 seconds — ABNORMAL HIGH (ref 11.6–15.2)
Prothrombin Time: 29 seconds — ABNORMAL HIGH (ref 11.6–15.2)
Prothrombin Time: 14.1 seconds (ref 11.6–15.2)

## 2010-04-12 LAB — CBC
HCT: 31.6 % — ABNORMAL LOW (ref 36.0–46.0)
HCT: 33.4 % — ABNORMAL LOW (ref 36.0–46.0)
HCT: 35 % — ABNORMAL LOW (ref 36.0–46.0)
HCT: 29 % — ABNORMAL LOW (ref 36.0–46.0)
HCT: 30.3 % — ABNORMAL LOW (ref 36.0–46.0)
Hemoglobin: 10.7 g/dL — ABNORMAL LOW (ref 12.0–15.0)
Hemoglobin: 10.8 g/dL — ABNORMAL LOW (ref 12.0–15.0)
Hemoglobin: 11.9 g/dL — ABNORMAL LOW (ref 12.0–15.0)
Hemoglobin: 10.1 g/dL — ABNORMAL LOW (ref 12.0–15.0)
Hemoglobin: 11.1 g/dL — ABNORMAL LOW (ref 12.0–15.0)
Hemoglobin: 11.2 g/dL — ABNORMAL LOW (ref 12.0–15.0)
Hemoglobin: 11.4 g/dL — ABNORMAL LOW (ref 12.0–15.0)
Hemoglobin: 11.8 g/dL — ABNORMAL LOW (ref 12.0–15.0)
Hemoglobin: 12.5 g/dL (ref 12.0–15.0)
MCHC: 33.8 g/dL (ref 30.0–36.0)
MCHC: 34 g/dL (ref 30.0–36.0)
MCHC: 34.8 g/dL (ref 30.0–36.0)
MCHC: 32.9 g/dL (ref 30.0–36.0)
MCHC: 33.3 g/dL (ref 30.0–36.0)
MCHC: 33.4 g/dL (ref 30.0–36.0)
MCHC: 33.5 g/dL (ref 30.0–36.0)
MCHC: 33.6 g/dL (ref 30.0–36.0)
MCHC: 33.7 g/dL (ref 30.0–36.0)
MCHC: 33.7 g/dL (ref 30.0–36.0)
MCHC: 34.3 g/dL (ref 30.0–36.0)
MCV: 92.2 fL (ref 78.0–100.0)
MCV: 93 fL (ref 78.0–100.0)
MCV: 94.1 fL (ref 78.0–100.0)
MCV: 91.8 fL (ref 78.0–100.0)
MCV: 91.9 fL (ref 78.0–100.0)
MCV: 92 fL (ref 78.0–100.0)
MCV: 92.9 fL (ref 78.0–100.0)
MCV: 93 fL (ref 78.0–100.0)
Platelets: 303 10*3/uL (ref 150–400)
Platelets: 409 10*3/uL — ABNORMAL HIGH (ref 150–400)
Platelets: 518 10*3/uL — ABNORMAL HIGH (ref 150–400)
Platelets: 148 10*3/uL — ABNORMAL LOW (ref 150–400)
Platelets: 150 10*3/uL (ref 150–400)
Platelets: 152 10*3/uL (ref 150–400)
Platelets: 241 10*3/uL (ref 150–400)
Platelets: 284 10*3/uL (ref 150–400)
Platelets: 427 10*3/uL — ABNORMAL HIGH (ref 150–400)
RBC: 3.41 MIL/uL — ABNORMAL LOW (ref 3.87–5.11)
RBC: 3.53 MIL/uL — ABNORMAL LOW (ref 3.87–5.11)
RBC: 3.93 MIL/uL (ref 3.87–5.11)
RBC: 2.9 MIL/uL — ABNORMAL LOW (ref 3.87–5.11)
RBC: 3.27 MIL/uL — ABNORMAL LOW (ref 3.87–5.11)
RBC: 3.57 MIL/uL — ABNORMAL LOW (ref 3.87–5.11)
RBC: 3.62 MIL/uL — ABNORMAL LOW (ref 3.87–5.11)
RBC: 3.78 MIL/uL — ABNORMAL LOW (ref 3.87–5.11)
RBC: 4.14 MIL/uL (ref 3.87–5.11)
RDW: 12.9 % (ref 11.5–15.5)
RDW: 12.9 % (ref 11.5–15.5)
RDW: 13.1 % (ref 11.5–15.5)
RDW: 13.5 % (ref 11.5–15.5)
RDW: 13.6 % (ref 11.5–15.5)
RDW: 12.8 % (ref 11.5–15.5)
RDW: 12.9 % (ref 11.5–15.5)
RDW: 12.9 % (ref 11.5–15.5)
RDW: 12.9 % (ref 11.5–15.5)
RDW: 13.1 % (ref 11.5–15.5)
WBC: 11.1 10*3/uL — ABNORMAL HIGH (ref 4.0–10.5)
WBC: 11.2 10*3/uL — ABNORMAL HIGH (ref 4.0–10.5)
WBC: 5.6 10*3/uL (ref 4.0–10.5)
WBC: 10.7 10*3/uL — ABNORMAL HIGH (ref 4.0–10.5)
WBC: 7.4 10*3/uL (ref 4.0–10.5)
WBC: 7.5 10*3/uL (ref 4.0–10.5)
WBC: 7.6 10*3/uL (ref 4.0–10.5)
WBC: 9.7 10*3/uL (ref 4.0–10.5)

## 2010-04-12 LAB — HSV PCR
HSV 2 , PCR: NOT DETECTED
HSV, PCR: NOT DETECTED

## 2010-04-12 LAB — CSF CELL COUNT WITH DIFFERENTIAL
RBC Count, CSF: 1 /mm3 — ABNORMAL HIGH
WBC, CSF: 1 /mm3 (ref 0–5)

## 2010-04-12 LAB — HEPARIN LEVEL (UNFRACTIONATED)
Heparin Unfractionated: 0.43 IU/mL (ref 0.30–0.70)
Heparin Unfractionated: 0.73 IU/mL — ABNORMAL HIGH (ref 0.30–0.70)
Heparin Unfractionated: 0.79 IU/mL — ABNORMAL HIGH (ref 0.30–0.70)
Heparin Unfractionated: 0.18 IU/mL — ABNORMAL LOW (ref 0.30–0.70)
Heparin Unfractionated: 0.28 IU/mL — ABNORMAL LOW (ref 0.30–0.70)
Heparin Unfractionated: 0.42 IU/mL (ref 0.30–0.70)
Heparin Unfractionated: 0.44 IU/mL (ref 0.30–0.70)
Heparin Unfractionated: 0.45 IU/mL (ref 0.30–0.70)
Heparin Unfractionated: 0.47 IU/mL (ref 0.30–0.70)
Heparin Unfractionated: 0.48 IU/mL (ref 0.30–0.70)
Heparin Unfractionated: 0.52 IU/mL (ref 0.30–0.70)
Heparin Unfractionated: 0.65 IU/mL (ref 0.30–0.70)

## 2010-04-12 LAB — TSH: TSH: 1.171 u[IU]/mL (ref 0.350–4.500)

## 2010-04-12 LAB — CULTURE, BLOOD (ROUTINE X 2)

## 2010-04-12 LAB — TROPONIN I: Troponin I: 0.09 ng/mL — ABNORMAL HIGH (ref 0.00–0.06)

## 2010-04-12 LAB — URINALYSIS, ROUTINE W REFLEX MICROSCOPIC
Protein, ur: NEGATIVE mg/dL
Urobilinogen, UA: 0.2 mg/dL (ref 0.0–1.0)

## 2010-04-12 LAB — CLOSTRIDIUM DIFFICILE EIA

## 2010-04-12 LAB — CK: Total CK: 162 U/L (ref 7–177)

## 2010-04-12 LAB — ANA: Anti Nuclear Antibody(ANA): NEGATIVE

## 2010-04-12 LAB — CULTURE, RESPIRATORY W GRAM STAIN

## 2010-04-12 LAB — PROTEIN AND GLUCOSE, CSF
Glucose, CSF: 85 mg/dL — ABNORMAL HIGH (ref 43–76)
Total  Protein, CSF: 48 mg/dL — ABNORMAL HIGH (ref 15–45)

## 2010-04-12 LAB — CSF CULTURE W GRAM STAIN

## 2010-04-12 LAB — AFB CULTURE WITH SMEAR (NOT AT ARMC)

## 2010-04-12 LAB — PHENYTOIN LEVEL, TOTAL: Phenytoin Lvl: 14 ug/mL (ref 10.0–20.0)

## 2010-04-12 LAB — URINE MICROSCOPIC-ADD ON

## 2010-04-29 ENCOUNTER — Other Ambulatory Visit: Payer: Self-pay | Admitting: *Deleted

## 2010-04-29 ENCOUNTER — Ambulatory Visit
Admission: RE | Admit: 2010-04-29 | Discharge: 2010-04-29 | Disposition: A | Discharge status: Home / Self Care | Payer: Medicare Other | Source: Ambulatory Visit | Attending: *Deleted | Admitting: *Deleted

## 2010-04-29 DIAGNOSIS — R609 Edema, unspecified: Secondary | ICD-10-CM

## 2010-04-29 DIAGNOSIS — M79605 Pain in left leg: Secondary | ICD-10-CM

## 2010-05-01 ENCOUNTER — Other Ambulatory Visit: Payer: Self-pay | Admitting: Gastroenterology

## 2010-05-01 ENCOUNTER — Ambulatory Visit (HOSPITAL_COMMUNITY)
Admission: RE | Admit: 2010-05-01 | Discharge: 2010-05-01 | Disposition: A | Discharge status: Home / Self Care | Payer: Medicare Other | Source: Ambulatory Visit | Attending: Gastroenterology | Admitting: Gastroenterology

## 2010-05-01 DIAGNOSIS — Z7901 Long term (current) use of anticoagulants: Secondary | ICD-10-CM | POA: Insufficient documentation

## 2010-05-01 DIAGNOSIS — R1310 Dysphagia, unspecified: Secondary | ICD-10-CM | POA: Insufficient documentation

## 2010-05-18 NOTE — Op Note (Signed)
  NAMEELLA, GOLOMB               ACCOUNT NO.:  [id number]  MEDICAL RECORD NO.:  [id number]           PATIENT TYPE:  O  LOCATION:  WLEN                         FACILITY:  [facility]  PHYSICIAN:  Danise Edge, M.D.   DATE OF BIRTH:  June 16, 1934  DATE OF PROCEDURE: DATE OF DISCHARGE:                              OPERATIVE REPORT   PROCEDURE:  Diagnostic esophagogastroduodenoscopy with random esophageal biopsy.  REFERRING PHYSICIAN:  Candyce Churn, M.D.  HISTORY:  [name] is a 76 year old female born on 1934-12-02.  The patient has chronic esophageal dysphagia.  In September 2010, her esophagogastroduodenoscopy was normal.  In February 2012, the patient's barium esophagram with barium tablet was normal.  The patient was taking potassium chloride tablets on a daily basis which can cause esophageal injury.  Her potassium chloride tablets were stopped.  The patient chronically takes Coumadin.  She stopped the Coumadin 5 days prior to her esophagogastroduodenoscopy today.  ENDOSCOPIST:  Danise Edge, M.D.  PREMEDICATION:  Fentanyl 15 mcg, Versed 2.5 mg.  PROCEDURE IN DETAIL:  After obtaining informed consent, the patient was placed in the left lateral decubitus position.  The Pentax gastroscope was passed through the posterior hypopharynx into the proximal esophagus without difficulty.  The hypopharynx, larynx and vocal cords appeared normal.  Esophagoscopy:  The proximal mid and lower segments of the esophageal mucosa appeared normal.  The squamocolumnar junction is noted at 40 cm from the incisor teeth.  There is no endoscopic evidence for the presence of erosive esophagitis, Barrett's esophagus, esophageal stricture formation or esophageal obstruction.  Gastroscopy:  Retroflexed view of the gastric cardia and fundus was normal.  The gastric body, antrum and pylorus appeared normal.  Duodenoscopy:  The duodenal bulb and descending duodenum  appeared normal.  Esophageal Biopsies:  Random esophageal biopsies were performed to look for eosinophilic esophagitis.  ASSESSMENT:  Normal esophagogastroduodenoscopy.  Esophageal biopsies to look for eosinophilic esophagitis pending.          ______________________________ Danise Edge, M.D.     MJ/MEDQ  D:  05/01/2010  T:  05/01/2010  Job:  [id number]  cc:   Candyce Churn, M.D. Fax: [phone]  Electronically Signed by Danise Edge M.D. on 05/18/2010 08:52:24 AM

## 2010-05-20 NOTE — Consult Note (Signed)
NAMEALLANNA, [name]               ACCOUNT NO.:  [id number]   MEDICAL RECORD NO.:  [id number]          PATIENT TYPE:  OUT   LOCATION:  GYN                          [facility]ACILITY:  [facility]   PHYSICIAN:  De Blanch, M.D.DATE OF BIRTH:  Jun 10, 1934   DATE OF CONSULTATION:  DATE OF DISCHARGE:                                 CONSULTATION   CHIEF COMPLAINT:  Endometrial cancer.   INTERVAL HISTORY:  Since her last visit the patient has shown  considerable improvement in her overall symptoms.  After surgery, she  received radiation therapy and chemotherapy, which was quite  debilitating.  The patient now feels as though she has an excellent  appetite and her functional status continues to steadily improve.  She  does note that she has diarrhea about every 3rd day.  She takes Imodium  AD for this with good results.  She has not found that there is any  particular dietary product that aggravates the diarrhea.  I did indicate  to her that green leafy vegetables, milk products, and fried foods might  be worthy of eliminating from her diet at the present time.   She denies any pelvic pain, pressure, vaginal bleeding, or discharge.   PAST MEDICAL HISTORY:   MEDICAL ILLNESSES:  1. Anxiety and depression.  2. Deep vein thrombosis and pulmonary embolus.  3. Borderline hypertension.  4. Chronic insomnia.  5. GERD.  6. Rotator cuff injury with repair.   CURRENT MEDICATIONS:  Lexapro, Paxil, Ativan, Protonix, Ambien, and  Coumadin.   DRUG ALLERGIES:  1. MORPHINE.  2. TYLENOL.  3. CODEINE.  4. DARVOCET.   FAMILY HISTORY:  Negative for gynecologic, breast, or colon cancers.   SOCIAL HISTORY:  The patient is married.  She comes accompanied by her  husband.  She is a retired Geophysicist/field seismologist.  She does not smoke.   REVIEW OF SYSTEMS:  A 10-point comprehensive review of systems negative  except as noted above.   PHYSICAL EXAMINATION:  VITAL SIGNS:  Weight 128 pounds (up 8 pounds  from  our visit in June), blood pressure 136/76, pulse 80, respiratory rate  20.  GENERAL:  The patient is a slender African American female in no acute  distress.  HEENT:  Negative.  NECK:  Supple without thyromegaly.  There is no supraclavicular or  inguinal adenopathy.  ABDOMEN:  Soft, nontender.  No masses, organomegaly, ascites, or hernias  noted.  PELVIC:  EG/BUS, vagina, bladder, urethra are normal.  Cervix and the  uterus are surgically absent.  Adnexa without masses.  Rectovaginal exam  confirms.   IMPRESSION:  Stage IIIC endometrial carcinoma with multiple positive  pelvic nodes, status post sandwich therapy using carboplatin and Taxol  for 3 cycles, then radiation therapy, then 3 additional cycles of  carboplatin and Taxol.   The patient will return to see Dr. Roselind Messier in 3 months and return to see  [place] in 6 months.      De Blanch, M.D.  Electronically Signed     DC/MEDQ  D:  09/24/2006  T:  09/24/2006  Job:  [id number]   cc:  Lauretta I. Odogwu, M.D.  Fax: [phone]   Telford Nab, R.N.  [address]  [place], [place] [address]   Candyce Churn, M.D.  Fax: [phone]   Billie Lade, M.D.  Fax: [phone]

## 2010-05-20 NOTE — Consult Note (Signed)
[name], [name]               ACCOUNT NO.:  [id number]   MEDICAL RECORD NO.:  [id number]          PATIENT TYPE:  OUT   LOCATION:  GYN                          FACILITY:  [facility]   PHYSICIAN:  De Blanch, M.D.DATE OF BIRTH:  May 15, 1934   DATE OF CONSULTATION:  10/12/2007  DATE OF DISCHARGE:                                 CONSULTATION   CHIEF COMPLAINT:  Endometrial cancer, fatigue, diarrhea.   INTERVAL HISTORY:  The patient returns today for continuing followup of  her endometrial cancer.  Since her last visit, she has continued to have  diarrhea, which is somewhat related to her dietary intake.  She uses  Imodium on occasion.  She has really not modified her diet despite prior  advice.  She also notes that she is fatigued and has been chronically  depressed.   She denies any vaginal bleeding or pelvic pain.   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  She had a TAH/BSO and pelvic lymphadenectomy.  She had extensive  involvement of her pelvic lymph nodes and was treated with a combination  of carboplatin and Taxol for 3 cycles followed by pelvic radiation,  followed by 3 additional cycles of carboplatin and Taxol given in a  sandwich technique.   The patient had a preoperative history of deep vein thrombosis and has a  vena cava filter in place.   PAST MEDICAL HISTORY AND MEDICAL ILLNESSES:  Anxiety, depression, deep  vein thrombosis in 1973, recurrence in 2007.  Pulmonary embolus,  borderline hypertension, chronic insomnia, depression, gastroesophageal  reflux disease, rotator cuff repair.   CURRENT MEDICATIONS:  Lexapro.  Paxil.  Ativan.  Protonix.  Ambien.   ALLERGIES:  MORPHINE, TYLENOL, CODEINE, and DARVOCET.   PAST SURGICAL HISTORY:  TAH/BSO, pelvic lymphadenectomy, inferior vena  cava filter placement, rotator cuff repair.   FAMILY HISTORY:  Negative for  gynecologic, breast, or colon cancer.   SOCIAL HISTORY:  The patient is married.  She is a retired Investment banker, operational.  She does not smoke.  She comes accompanied by her husband.   REVIEW OF SYSTEMS:  A 10-point comprehensive review of systems is  negative, except as noted above.   PHYSICAL EXAM:  VITAL SIGNS:  Weight 142 pounds, blood pressure 138/80,  pulse 70.  GENERAL:  The patient is a slender African American female who appears  fatigued and somewhat depressed.  HEENT:  Negative.  NECK:  Supple without thyromegaly.  There is no supraclavicular or  inguinal adenopathy.  ABDOMEN:  Soft, nontender.  No mass, organomegaly, ascites, or hernias  noted.  PELVIC:  EG, BUS, vaginal, and urethra normal.  There are some radiation  changes in the upper vagina.  Bimanual exam reveals no mass, induration,  or nodularity.  Rectovaginal exam confirms.  LOWER EXTREMITIES:  Without edema or varicosities.   IMPRESSION:  1. Stage IIIC endometrial carcinoma, August 2007, clinically free of      disease.  2. Chronic diarrhea.  Had a lengthy discussion regarding dietary      modifications,  as well as using Imodium.   The patient to return to see Dr. Roselind Messier as previously scheduled, and I  will see the patient 6 months.  Pap smear is obtained today.      De Blanch, M.D.  Electronically Signed     DC/MEDQ  D:  10/12/2007  T:  10/12/2007  Job:  [id number]   cc:   Vicente Serene I. Odogwu, M.D.  Fax: [phone]   Candyce Churn, M.D.  Fax: [phone]   Billie Lade, M.D.  Fax: [phone]   Telford Nab, R.N.  [address]  [place], [place] [address]

## 2010-05-20 NOTE — Consult Note (Signed)
NAMENEYA, CREEGAN               ACCOUNT NO.:  [id number]   MEDICAL RECORD NO.:  [id number]          PATIENT TYPE:  OUT   LOCATION:  GYN                          FACILITY:  [facility]   PHYSICIAN:  Paola A. Duard Brady, MD    DATE OF BIRTH:  April 14, 1934   DATE OF CONSULTATION:  03/14/2008  DATE OF DISCHARGE:                                 CONSULTATION   Ms. 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  She underwent a TAH-BSO and pelvic  lymphadenectomy.  She had extensive involvement of her pelvic nodes and  was treated with a combination of carboplatin and paclitaxel for 3  cycles, followed by pelvic radiation, followed by an additional 3 cycles  of carboplatin and paclitaxel given in a sandwich fashion.  The patient  preoperatively had a history of a DVT and has a vena cava filter in  place.  She is currently not on any anticoagulation.  She had  significant issues with her treatment with a significant amount of  weight loss, etc.  She was last seen by Dr. Stanford Breed in October  2009 at which time her exam was unremarkable as was her Pap smear.  She  comes in today for followup.   REVIEW OF SYSTEMS:  She denies any chest pain, shortness of breath,  nausea, vomiting, fevers, chills, headaches or visual changes.  She  occasionally still has some diarrhea.  She presented to her primary  physician with pain in her right groin and there was a cyst noted.  She  was placed on an antibiotic, she does not remember the name of it, and  she had rash and itching and it was discontinued.  She states that the  lump is now gone.  She has an appointment with Dr. Roselind Messier in April 2010.  She otherwise denies any unintentional weight loss or weight gain.  She  states she is feeling stronger and stronger when compared to this time  last year, has steadily gained weight.  She  denies any vaginal bleeding.   MEDICATIONS INCLUDE:  Ativan, half an Excedrin in the morning, Prilosec  p.r.n., Imodium p.r.n.   PAST MEDICAL HISTORY:  1. Anxiety/depression.  2. DVT in 1973.  3. 2007 pulmonary embolism.  4. Hypertension.  5. Chronic insomnia.  6. Depression.  7. Gastroesophageal reflux disease.  8. Rotator cuff repair.   ALLERGIES:  MORPHINE, TYLENOL, CODEINE, DARVOCET, and a questionable  ANTIBIOTIC.   PHYSICAL EXAMINATION:  Weight 146-1/2 pound, blood pressure 134/78.  Well-nourished, well-developed female in no acute distress.  NECK:  Supple.  There is no lymphadenopathy, no thyromegaly.  LUNGS:  Clear to auscultation bilaterally.  CARDIOVASCULAR EXAM:  Regular rate and rhythm.  ABDOMEN:  Shows a well-healed vertical midline incision.  There is no  incisional hernia.  ABDOMEN:  Soft, nontender, nondistended.  There are no palpable masses  or hepatosplenomegaly.  Groins are negative for adenopathy.  EXTREMITIES:  There is no edema.  PELVIC:  External genitalia is within  normal limits.  Vagina is slightly  atrophic.  The vaginal cuff is visualized.  There are no visible  lesions.  ThinPrep Pap was performed without difficulty.  Bimanual  examination reveals no masses or nodularity.  Rectal confirms.  On the  right vulva there is no distinct mass, but there is evidence of a  questionable sebaceous cyst versus an infected hair follicle.   ASSESSMENT:  A 73-year with stage IIIC endometrial carcinoma diagnosed  in August 2007 who clinically is free of disease.   PLAN:  She appears to be feeling better than she has.  The diarrhea  seems to have ameliorated somewhat.  She will continue using Imodium  p.r.n., but is overall feeling much better than she has in quite some  time.  Her youngest brother unfortunately recently passed away.  He  lived in [place]. and had either a laryngeal or lung cancer and  never sought any treatment and recently passed from  his disease.  She  will follow up with [place] in 8 months and see Dr. Roselind Messier in 4 months.      Paola A. Duard Brady, MD  Electronically Signed     PAG/MEDQ  D:  03/14/2008  T:  03/15/2008  Job:  [id number]   cc:   Vicente Serene I. Odogwu, M.D.  Fax: [phone]   Candyce Churn, M.D.  Fax: [phone]   Billie Lade, M.D.  Fax: [phone]   Telford Nab, R.N.  [address]  [place], [place] [address]

## 2010-05-20 NOTE — Discharge Summary (Signed)
NAMERAYSHELL, GOECKE               ACCOUNT NO.:  [id number]   MEDICAL RECORD NO.:  [id number]          PATIENT TYPE:  INP   LOCATION:  1434                         FACILITY:  [facility]   PHYSICIAN:  Candyce Churn, M.D.DATE OF BIRTH:  04/26/1934   DATE OF ADMISSION:  08/08/2008  DATE OF DISCHARGE:                               DISCHARGE SUMMARY   DISCHARGE DIAGNOSES:  1. Seizure, generalized tonic-clonic, likely secondary to right venous      sinus thrombosis, intracerebral.  2. 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  3. Anticoagulation therapy for right cerebral venous infarction      secondary to venous thrombus.  4. Aspiration pneumonia likely secondary to seizure, resolved.  5. Mild hyperglycemia.  6. History of gastroesophageal reflux disease.  7. Hypertension.  8. History of goiter.  9. Anxiety.  10.Stage III endometrial cancer treated with hysterectomy,      chemotherapy and radiation, with no evidence of recurrence.   ALLERGIES:  1. MORPHINE.  2. DARVOCET.  3. SULFA DRUGS.  4. CODEINE.  5. TYLENOL.  6. NEXIUM.  7. ASPIRIN.   MEDICATIONS:  1. Klonopin 2 mg p.o. q.h.s.  2. Lovenox 60 mg subcutaneous q.12 h.  3. Keppra 500 mg p.o. b.i.d.  4. Imodium 2 mg p.o. q.6 h. p.r.n. diarrhea.  5. Prilosec 20 mg b.i.d.  6. Warfarin, initiated on August 23, 2008, to increase INR to between      2 and 3.  7. Ativan 0.5 mg q.6 h. p.r.n. anxiety.   May be having to resume antihypertensive medication with mild elevations  in blood pressure over the last several days.   HOSPITAL COURSE:  Ms. Pailyn Bellevue is a very pleasant 76 year old  female with a previous history of DVT following treatment for  endometrial cancer several years ago, who approximately 1 day prior to  admission developed slurred speech, confusion and left arm and leg  weakness.  She was seen by Dr.  Merlene Laughter, who scheduled the patient  for MRI of the brain.  Over the next 24 hours she became weaker, was  brought to the emergency room, and she underwent a CT scan of the head  showing possible CVA versus possible mass in the right brain.  She then  had a follow-up MRI which showed a possible cerebritis with no evidence  of infarction, but there appeared to be a venous sinus thrombosis.  She  developed a significant left extremity arm weakness that seemed to be  worsening, greater than left lower extremity weakness.  Shortly after  admission she developed a generalized tonic-clonic seizure and was  transferred to the critical care service.  Subsequently she was found to  have a venous sinus thrombosis and also aspiration pneumonia likely  secondary to her seizure.  She was treated by critical care medicine  from August 4 through August 15, 2008, and was intubated between August  5 and August 13, 2008.   She was seen in consultation by Dr. Mellody Dance  Willis and Dr. Kelli Hope and Dr. Porfirio Mylar Dohmeier.   She was found to have a venous right cerebrovascular stroke and treated  with intravenous heparin.  She was also started on Keppra for seizures  prophylactically after her initial tonic-clonic seizure.  Dilantin was  used initially, then was discontinued and Keppra has been continued.   She has continued to do well after discharge from critical care service  and Panda tube has been removed and she has been seen by speech therapy  and is able to swallow adequately for a thin-liquid dysphagia II diet,  and she is speaking almost completely normally.  She still cannot raise  her left arm above the horizontal, but grip strength in the left upper  extremity is 4/5.  Foley catheter has been removed and she is voiding  normally.   She will need continued Lovenox therapy until the PT/INR is therapeutic,  i.e., between 2 and 3, and then Lovenox can be discontinued.  She is now  being  transferred to [facility] inpatient rehab for further physical  therapy and treatment.   CONDITION ON DISCHARGE:  Much improved.   Discharge laboratories include the following:  PT on August 20, 14.1  seconds with INR 1.1.  CBC reveals a white count of 5600, hemoglobin  11.2, platelet count 409,000.  BMET from August 22, 2008, reveals sodium  136, potassium 4.3, chloride 100, bicarb 28, glucose 114, BUN 12,  creatinine was 0.67.   She did experience diarrhea following antibiotic therapy for her  aspiration pneumonia, and C. difficile toxin was negative.  Diarrheal  stools were felt to be secondary to tube feeds, which resolved with  switching tube feedings, and these have been discontinued regardless.  Blood cultures x2 on August 7 revealed no growth at 5 days.  Liver  function from August 16, 2008, revealed total bilirubin 0.4, alkaline  phosphatase 83, SGOT 38, SGPT 34, total protein 5.9, albumin 2.4,  calcium 9.0.   Again, condition on discharge much improved.      Candyce Churn, M.D.  Electronically Signed     RNG/MEDQ  D:  08/24/2008  T:  08/24/2008  Job:  [id number]

## 2010-05-20 NOTE — Consult Note (Signed)
[name], [name]               ACCOUNT NO.:  [id number]   MEDICAL RECORD NO.:  [id number]          PATIENT TYPE:  OUT   LOCATION:  GYN                          FACILITY:  [facility]   PHYSICIAN:  De Blanch, M.D.DATE OF BIRTH:  27-Apr-1934   DATE OF CONSULTATION:  DATE OF DISCHARGE:                                 CONSULTATION   CHIEF COMPLAINT:  Endometrial cancer.   INTERVAL HISTORY:  The patient has now completed her sandwich therapy  for her stage IIIC endometrial carcinoma.  She was initially treated  with three cycles of carboplatin and Taxol followed by whole pelvis  radiation therapy.  She tolerated the therapy poorly, but we were able  to complete all six cycles of chemotherapy, the last three being  administered as single agent carboplatin.  She has been off therapy  approximately a month and feels that she is improving in her strength,  energy, and stamina.  She does have some diarrhea on occasion.  She has  not altered her diet at all.   HISTORY OF PRESENT ILLNESS:  The patient underwent total abdominal  hysterectomy, bilateral salpingo-oophorectomy, and pelvic  lymphadenectomy in August, 2007.  She had extensive involvement of the  pelvic lymph nodes.  Because she had a vena cava filter in place, we did  not perform an aortic lymphadenectomy.  She was treated with Taxol and  carboplatin for three cycles followed by pelvic radiation and three  additional cycles of carboplatin.   PAST MEDICAL HISTORY:  Anxiety/depression, deep venous thrombosis,  pulmonary embolism, borderline hypertension, chronic insomnia,  gastroesophageal reflux disease, rotator cuff repair.   CURRENT MEDICATIONS:  Lexapro, Paxil, Ativan, Protonix, Ambien, and  Coumadin.   DRUG ALLERGIES:  MORPHINE, TYLENOL, CODEINE, DARVOCET.   FAMILY HISTORY:  Negative for gynecologic, breast, or colon cancer.   SOCIAL HISTORY:  Patient is married.  She comes accompanied by her  husband.  She is a  retired Geophysicist/field seismologist.  She does not smoke.   REVIEW OF SYSTEMS:  A 10-point comprehensive review of systems is  negative, except as noted above.   PHYSICAL EXAMINATION:  VITAL SIGNS:  Weight 119 pounds.  Blood pressure  98/60, pulse 60, respiratory rate 18.  GENERAL:  The patient is a healthy, slender African-American female in  no acute distress.  HEENT:  Negative.  NECK:  Supple without thyromegaly.  There is no supraclavicular or  inguinal adenopathy.  ABDOMEN:  Soft and nontender.  No mass, organomegaly, ascites, or  hernias are noted.  Midline incision is well healed.  PELVIC:  EG/BUS, vagina, bladder, and urethra are normal but atrophic.  The cuff is well supported.  No lesions are noted.  Bimanual and  rectovaginal exam reveals no masses, induration, or nodularity.  LOWER EXTREMITIES:  Without edema or varicosities.   Patient's CT scan of the chest, abdomen, and pelvis is reviewed, and  there is no evidence of recurrent or persistent disease.  She does have  some post-radiation changes in the pelvis.   IMPRESSION:  Stage III endometrial carcinoma, doing well with no  evidence of disease at the present  time.  The patient will return to see  [place] in three months for continuing followup.      De Blanch, M.D.  Electronically Signed     DC/MEDQ  D:  07/02/2006  T:  07/02/2006  Job:  [id number]   cc:   Vicente Serene I. Odogwu, M.D.  Fax: [phone]   Telford Nab, R.N.  [address]  [place], [place] [address]   Candyce Churn, M.D.  Fax: [phone]   Phil D. Okey Dupre, M.D.   Lennis P. Darrold Span, M.D.  Fax: [phone]

## 2010-05-20 NOTE — H&P (Signed)
NAMEGETSEMANI, LINDON               ACCOUNT NO.:  [id number]   MEDICAL RECORD NO.:  [id number]          PATIENT TYPE:  EMS   LOCATION:  ED                           [facility]ACILITY:  [facility]   PHYSICIAN:  Hollice Espy, M.D.DATE OF BIRTH:  [birth year]   DATE OF ADMISSION:  08/08/2008  DATE OF DISCHARGE:                              HISTORY & PHYSICAL   PRIMARY CARE PHYSICIAN:  Dr. Johnella Moloney.   CONSULTS:  1. Dr. Porfirio Mylar Dohmeier, neurology.  2. Oncologist, Dr. Loree Fee.   CHIEF COMPLAINT:  Left-sided weakness and reportedly some altered mental  status.   HISTORY OF PRESENT ILLNESS:  The patient is a 76 year old African  American female with past medical history of GERD, previous history of  DVT and neuropathy as well as hypertension and endometrial cancer, who  approximately 1 day prior started having signs possibly concerning for a  CVA.  Specifically by family, she was having problems with slurred  speech, confusion, and left arm and leg weakness.  The patient saw Dr.  Kevan Ny' partner, Dr. Merlene Laughter, and scheduled the patient for an MRI.  The patient got even weaker and was brought in to the emergency room  today, 1 day prior to her MRI.  The patient underwent a CT scan of the  head which showed a possible CVA versus possible mass.  She then had a  full-up MRI, which showed a possible cerebritis.  No evidence of  infarction.  The less likely diagnoses were a sinus thrombosis.  With  these findings and the patient's symptoms, it was felt best that the  patient come in for further evaluation and treatment.   When I saw the patient, she looked to be some quite fatigued and almost  somnolent; however, she was able to answer questions.  Her speech was  somewhat slurred; however she did answer questions appropriately,  responding to name, able to tell me who her family was, as well as she  was able to name the President.  She seemed to be oriented to place and  year.  When  asked about, she denied any pain other than continued  headache, which she described as generalized.  She denied any vision  problems, including photophobia.  No neck pain or stiffness.  She denied  any swallowing difficulties.  No chest pain or palpitations.  She denied  any shortness of breath, wheezing, or coughing.  She denied any  abdominal pain.  No hematuria, dysuria, constipation, diarrhea.  She  denied any focal extremity pain but did complain of some significant  left upper extremity weakness to the point where she felt she could  barely move that arm or hand and required the assistance of her right  side to do it,  all of which is brand-new for her.  In addition, she did  not attribute very much to any left lower extremity weakness; however,  when asked to move that left lower extremity, that is when she had more  attributed weakness there, but she said her arm is much worse.  Her  review of systems is otherwise negative.  Her past medical history includes history of GERD, back pain, chronic  diarrhea, neuropathy, a previous history of DVT and PE, osteoarthritis,  history of goiter, anxiety, hypertension, aortic aneurysm, chronic  fatigue, and endometrial cancer, stage III.  She also has a history of  an IVC filter, hysterectomy, and sinus surgery.   MEDICATIONS:  The patient is on:  Ativan 0.5 p.o. daily and Prilosec 20  mg p.o. daily.   She has allergies to MORPHINE, DARVOCET, SULFA, CODEINE, TYLENOL, and  NEXIUM as well as ASPIRIN.   SOCIAL HISTORY:  No tobacco, heavy alcohol or drug use.   FAMILY HISTORY:  Noted for some CAD.   PHYSICAL EXAMINATION:  VITALS ON ADMISSION:  Blood pressure 152/79,  heart rate 78, respirations 18, temperature 98.3, O2 sat 100% on room  air.  GENERAL:  She is in a somewhat fatigued state.  She is alert and  oriented, probably about x3.  HEENT: Normocephalic atraumatic.  Mucous membranes a little bit dry.  Very difficult to obtain a  cranial nerve exam on her more so from some  generalized weakness, does not appear to be any focal deficits.  She has  no carotid bruits.  HEART:  Regular rate and rhythm.  S1-S2.  LUNGS:  Clear to auscultation bilaterally.  ABDOMEN:  Soft and nontender, nontender, positive bowel sounds remission  no clubbing, cyanosis or edema.  She has bilateral downgoing Babinski's,  but her gross and fine motor control are impaired.  She is about a 4- on  the left upper extremity, compared to about a 5 to a 5+ on the right,  both in flexion, extension and grip.  On the left lower extremity she is  about a 4 to a 4+, in terms of flexion and extension.  Sensation appears  to be intact, regardless.   LAB WORK:  Sodium 141, potassium 2.9, chloride 105, bicarb 27, BUN 9,  creatinine 0.8, glucose 89.  Liver function tests are unremarkable.  White count 7.0 with 78% neutrophils, which is on the high end of  normal.  H&H is 12.5 and 30.  Platelet count 160, INR 1.1.   CT scan of the head reveals mass versus infarct of the right posterior  frontal lobe.   Chest x-ray is unremarkable.   MRI of the brain noted MRA, which is negative but no signs of acute  infarct.  However, abnormal findings in the right frontal and parietal  lobes, left parietal lobe:  Differential diagnosis would be cerebritis  versus venous infarction or sinus thrombosis.   PLAN:  1. Left-sided weakness, questionable cerebritis, per MRI with      neurology consult, Dr. Vickey Huger, from neurology has been consulted      to follow the patient in the morning.  In the meantime, she is      recommending checking an RPR, EEG, treating the patient with a dose      of Depakote.  Also, will get a 2-D echo, blood cultures, and an      ANA.  2. History of deep venous thrombosis and pulmonary embolism, status      post IVC filter.  For now until we are better sure of her      diagnosis, we will go with PAS hose.  3. Hypokalemia:  We will replace  after we check a swallowing test.  4. History of goiter:  Check a TSH.  5. History of anxiety and depression, stable.  Monitor her blood  pressure.  Notice slightly elevation at this time, will not try to      greatly reduced unless it goes beyond and above 180.  Again, until      we have a better diagnosis.      Hollice Espy, M.D.  Electronically Signed     SKK/MEDQ  D:  08/08/2008  T:  08/08/2008  Job:  [id number]   cc:   Candyce Churn, M.D.  Fax: [phone]   Melvyn Novas, M.D.  Fax: [phone]

## 2010-05-20 NOTE — Consult Note (Signed)
[name], [name]               ACCOUNT NO.:  [id number]   MEDICAL RECORD NO.:  [id number]          PATIENT TYPE:  INP   LOCATION:  1225                         FACILITY:  [facility]   PHYSICIAN:  Felicie Morn, PA-C      DATE OF BIRTH:  01/07/1934   DATE OF CONSULTATION:  08/08/2008  DATE OF DISCHARGE:                                 CONSULTATION   CHIEF COMPLAINT:  Altered mental status.   HISTORY OF PRESENT ILLNESS:  A 76 year old African American female with  past medical history of GERD, DVT neuropathy, hypertension, endometrial  cancer.  Apparently over the past 3 days, the patient has been showing  left-sided weakness especially in the upper extremity progressing to her  lower extremity.  This concerned her and her husband to the point in  which they had gone to see Dr. Merlene Laughter who scheduled an MRI for  August 09, 2008.  The patient did not last until August 09, 2008 and was  rushed to the Emergency Room on August 08, 2008, for increasing  generalized somnolence and left-sided weakness.  At initial visit in the  Emergency Department, the patient was alert and oriented x3.  She  complained of left arm weakness, distal greater than proximal.  She was  able to answer all questions.  The patient was brought up to 1425 at  [facility].  While she was up on that floor, the son states  that she had approximately a 1/2-minute seizure of tonic-clonic origin  which no medications were given.  The patient was then transferred down  to the Critical Care Unit at which time she had experienced a tonic-  clonic seizure lasting for approximately 25 minutes.  The patient was  given 13 mg of Ativan at that time.  Critical Care was called.  The  patient was intubated, placed on Diprivan, and given 1 g of Dilantin.  Today on consultation, the patient is sedated on a Diprivan drip,  noncommunicative, and also on the ventilator.  She is breathing above  the vent at this time.   PAST  MEDICAL HISTORY:  Hypertension, stage IIIC endometrial papillary  serous carcinoma status post TAH-BSO in 2007 with pelvic  lymphadenectomy.  She was treated with paclitaxel,  then treated with  irradiation, then treated with carboplatin and paclitaxel back in 2007.  She had an IVC filter in 2007.  The patient also has a history of DVT  and PE back in 1975 and also in 2007.  The patient also has a history of  hypertension, chronic fatigue disorder, GERD, back pain, chronic  diarrhea, neuropathy, anxiety, goiter, aortic aneurysm.   MEDICATIONS:  The patient at home is on Ativan and Prilosec.   HOSPITAL MEDICATIONS:  Ativan, Prilosec, Diprivan.   ALLERGIES:  INCLUDE MORPHINE, CODEINE, DARVOCET, TYLENOL, SULFA, NEXIUM,  AND ASPIRIN.   FAMILY HISTORY:  Brother and sister both have history of cancers, brain,  breast, and intestinal.   SOCIAL HISTORY:  The patient lives is with husband, has children.  Does  not smoke, drink, or do illicit drugs.   REVIEW OF  SYSTEMS:  Negligible.   PHYSICAL EXAM:  Blood pressure is 115/60, pulse 76, respiratory 13,  temperature 98.5.  At present time, the patient is intubated and sedated.  She is  noncommunicative.  She does not follow commands.  She is breathing over  the vent.  Pupils are reactive 2 mm to 1 mm.  Positive doll's eyes  positive gag.  The patient's deep tendon reflexes are 2+ bilateral upper  extremity, 1+ at patella, and equivocal toes.  PULMONARY:  Clear to auscultation bilaterally.  CARDIOVASCULAR:  S1 and S2 is audible.  NECK:  Negative for bruits and supple.   LABS:  AST is 21, ALT 11, PT is 15.2, INR is 1.2, PTT is 21.  Sodium  136, potassium 2.7, chloride 106, CO2 19, BUN 8, creatinine 0.81,  glucose 149.  White blood cell count 9.7, platelets 149,000, hemoglobin  and hematocrit is 11.8 and 35.0.   IMAGING TESTS:  MRI of the brain shows abnormal subcortical and cortical  C2 signal in the frontal lobe bilaterally with right  greater than left.  There is also association of abnormal cortical and intravascular  enhancement, question of cerebritis at this time.  MRA shows  questionable truncated left LP1 segment, prominent fetal posterior  cerebral arteries, no occlusion noted.  CT scan shows no infarction.  2-  D echo is pending.   TREATMENT PLAN:  At this time, LP has been done at bedside by Dr. Anne Hahn  with no complications.  We will still recommend MRV and start the  patient on Keppra with a bolus of 1000 mg now and 500 t.i.d.  At this  point in time, there is question of the cause of the brain changes.  Must rule out infection, venous thrombosis, and cerebritis.           ______________________________  Felicie Morn, PA-C     DS/MEDQ  D:  08/09/2008  T:  08/09/2008  Job:  [id number]   cc:   Marlan Palau, M.D.  [facility]  Critical Care

## 2010-05-20 NOTE — Discharge Summary (Signed)
[name], [name]               ACCOUNT NO.:  [id number]   MEDICAL RECORD NO.:  [id number]          PATIENT TYPE:  INP   LOCATION:  1225                         FACILITY:  [facility]   PHYSICIAN:  Leslye Peer, MD    DATE OF BIRTH:  15-Nov-1934   DATE OF ADMISSION:  08/08/2008  DATE OF DISCHARGE:                               DISCHARGE SUMMARY   Date of critical care sign-off August 15, 2008.   FINAL DIAGNOSES:  1. Altered sensorium, and seizure in the setting of right cerebral      hemisphere venous infarction.  2. Acute respiratory failure secondary to problem #1.  3. Aspiration pneumonia.  4. Debility.  5. Hyperglycemia.   PROCEDURES:  1. Endotracheal tube placed August 5, removed August 9.  2. Right internal jugular vein catheter placed August 5.  3. Right radial A-line placed August 5, removed August 10.   CONSULTANTS:  Dr. Anne Hahn and Dr. Thad Ranger with neurology.   MICROBIOLOGY DATA:  Blood cultures x2 on August 7, no growth to date.  Cerebrospinal fluid from lumbar puncture on August 5 negative.  Urine  cultures negative, tracheal aspirate negative, both on August 11, 2008.   LABORATORY DATA:  August 15, 2008, sodium 139, potassium 3.7, chloride  102, CO2 31, glucose 151, BUN 8, creatinine 0.71, calcium 9.2, albumin  2.3, AST 22, ALT 21, total bilirubin 0.5.  Chest x-ray obtained on  August 15, 2008, demonstrates bibasilar atelectasis.   BRIEF HISTORY:  This is a 76 year old African American female with a  known history of deep vein thrombosis, gastroesophageal reflux disease,  and hypertension, as well as a remote history of cancer, presents to the  outpatient clinic on August 08, 2008 with a 1-day history of slurred  speech and left-sided weakness.  An MRI was obtained and demonstrated  question of cerebritis versus venous cerebrovascular infarction.  She  was sent to the emergency room, admitted initially by [facility]  to floor.  Over the night, became  progressively somnolent, had a  witnessed seizures.  This lasted approximately 25 minutes.  She was  intubated for airway protection on August 5.  The pulmonary critical  care team was asked to assume her care.   HOSPITAL COURSE BY DISCHARGE DIAGNOSIS:  1. Venous cerebrovascular accident with resultant infarction secondary      to venous thrombus, complicated by a witnessed seizure, resulting      in left-sided hemiparesis:  [name], as previously mentioned, was      admitted with MRI findings of above.  Because of this, she was      admitted to the regular ward.  She had a witnessed seizure on      hospital admit day, which was prolonged, requiring airway      protection.  She was initially loaded with Dilantin, and also      Keppra.  Neurology services were asked to evaluate.  She was seen      in consultation by Dr. Anne Hahn and then later Dr. Thad Ranger.  She has      been treated from an anticonvulsant standpoint,  initially with      Dilantin, also Keppra was added.  At this point she has been      narrowed to monotherapy with Keppra with no recurrent seizure      activity noted by EEG or clinically.  She continues to improve from      a wakefulness standpoint.  From long-term prognosis, it appears      that she continues to improve.  She will require lifelong      anticoagulation in the setting of prior DVT, prior cancer, and now      current venous thrombus.  Most recent MRI of brain demonstrates      still residual right vasogenic edema, affecting the right mid      frontal and subcortical white matter with anterior half of superior      sagittal sinus suggesting thrombus with probable resultant right      frontal subcortical infarctions.  From a planning care standpoint,      she will require continued rehabilitation foci, she will be      transitioned to low-molecular-weight heparin with plan to bridge to      lifelong Coumadin.  She may require further hematological       evaluation in the future in regards to the etiology of venous      thrombus.  The immediate future focus should be on rehabilitation      efforts.  2. Acute respiratory failure secondary to problem number one:      Initially [name] had a prolonged altered sensorium in the      setting of seizure.  She did require intubation for airway      protection.  She likely aspirated during this event.  She was      maintained on mechanical ventilation.  Bronchoalveolar lavage was      obtained.  This was negative for organisms.  She was successfully      extubated on August 13, 2008.  Initially her prognosis was deemed      guarded, but since then her mental status has improved to the point      where she is safely protecting her airway, and has improved from a      pulmonary standpoint.  From here on out, their recommendations from      a pulmonary standpoint would be to complete 7 days of vancomycin      and Zosyn, she is currently on day #5 of 7.  Additionally, wean      oxygen, and finally follow up and speech language pathology      consultation and results of modified barium swallow to evaluate      safety for swallowing.  3. Resolved systemic inflammatory response syndrome, secondary to      above:  See antibiotic discussion.  4. Hyperglycemia:  Plan for this is to continue sliding scale insulin.  5. Debility.  The patient's likely disposition will be to go home,      will need discharge planning.  We have ordered PT, OT consultation.      From immediate plan of care, the patient is pending modified barium      swallow if she passes this, she will start on oral Coumadin; if      not, we will need to consider PEG placement before Coumadin can be      started.  At this point, efforts need to be continued to      rehabilitation.  Foci,  and consideration of further hematological      workup will be deferred to medicine service.      Zenia Resides, NP      Leslye Peer, MD   Electronically Signed    PB/MEDQ  D:  08/15/2008  T:  08/15/2008  Job:  [id number]

## 2010-05-20 NOTE — H&P (Signed)
Deborah, [name] NO.:  [id number]   MEDICAL RECORD NO.:  [id number]          PATIENT TYPE:  IPS   LOCATION:  4030                         FACILITY:  [facility]   PHYSICIAN:  Ranelle Oyster, M.D.DATE OF BIRTH:  07-15-1934   DATE OF ADMISSION:  08/24/2008  DATE OF DISCHARGE:                              HISTORY & PHYSICAL   PRIMARY CARE PHYSICIAN:  Candyce Churn, M.D.   ONCOLOGIST:  Dr. Vonita Moss.   NEUROLOGIST:  Marlan Palau, M.D.   CHIEF COMPLAINT:  Left-sided weakness and balance issues.   HISTORY OF PRESENT ILLNESS:  This is a pleasant 76 year old African  American female with endometrial cancer and chemotherapy with DVT  history and IVC filter placement.  She was admitted in [facility] on August 08, 2008, with left-sided weakness.  MRI showed a  right temporal lobe infarct as well as superior sagittal sinus thrombus.  Initial concern for multiple foci of cerebritis was felt but then ruled  out with workup per Dr. Anne Hahn.  Her blood cultures now and LP lumbar  puncture were both negative.  The patient did develop a seizure on  August 10, 2008, and was loaded with Keppra.  EEG showed diffuse slowing  and indications of encephalopathic process.  She was placed on IV  heparin and Coumadin for a sagittal sinus thrombosis which was found.  She was changed to subcu Lovenox until INR greater than 2.0.  Speech and  Language Pathology saw the patient for modified barium swallow on August 12, 2008, and she was progressed to a D3 thin liquid diet with meds and  puree.  She has had some problems with sleeping as well as reflux which  is chronic.  These were both addressed.  She had some diarrhea as well  and C. diff was negative.  Rehab was consulted on August 20, 2008 and  felt that she could benefit from an inpatient admission, and the patient  was brought here today.   REVIEW OF SYSTEMS:  Notable for the above as well as some low back pain,  anxiety, insomnia.  Full 14-point review is in the written H and P.   PAST MEDICAL HISTORY:  Positive for GERD, low back pain, anxiety, triple  leg, chronic fatigue syndrome, endometrial cancer with XRT and  chemotherapy in 2007, hysterectomy, sinus surgery.  Negative alcohol or  tobacco use.   FAMILY HISTORY:  Positive for CAD.   SOCIAL HISTORY:  The patient lives with her husband, in one of the house  there are steps to enter.  Husband can assist at home.   ALLERGIES:  MORPHINE SULFATE, CODEINE, DARVOCET, ASPIRIN, SULFA and  NEXIUM.   HOME MEDICATIONS:  Ativan and Prilosec that she states she takes 20 mg  of t.i.d. before meals.   LABORATORY DATA:  Hemoglobin 11.2, white count 5.6, platelets 409.  Sodium 136, potassium 4.3, BUN 12, and creatinine 0.6.   PHYSICAL EXAMINATION:  VITAL SIGNS:  Blood pressure 144/82, pulse is  106, respiratory rate 17, temperature 98.6.  GENERAL:  The patient is pleasant, no acute stress.  She is alert,  oriented x3.  She does display some mild anxiety.  EAR, NOSE AND THROAT:  Notable for fair dentition.  Pink and moist  mucosa.  NECK: Supple without JVD or lymphadenopathy.  CHEST: Clear to auscultation bilaterally without wheezes, rales or  rhonchi.  HEART:  Regular rate and rhythm without murmur, rubs or gallops.  ABDOMEN:  Soft, nontender.  Bowel sounds are positive.  SKIN:  Generally intact throughout.  NEUROLOGIC:  Cranial nerve exam revealed a mild left central VII.  She  had no focal sensory loss on the left side.  Visual fields were grossly  intact.  Strength was 1-2/5 left shoulder, 4 minus/5 at the elbow and  wrist as well as the hand on the left.  She is 4+ on the right upper  extremity.  Left lower and right lower extremity both were 2+/5 proximal  to 4/5 distally.  She seemed to have some decrease in control and  spontaneous movement of the left side, however, I could not call it  ataxia.  She has very minimal difference in strength,  however.  Judgment  and orientation were fair as the patient is somewhat perseverative  partly due to anxiety on certain topics.  Memory is fair.  Mood,  although anxious was pleasant.   POST-ADMISSION PHYSICIAN ASSESSMENT:  1. Functional deficits secondary to right temporal lobe infarct and      superior sagittal sinus thrombus.  2. The patient is admitted to receive collaborative interdisciplinary      care between the physiatrist rehab nursing staff and therapy team.  3. The patient's level of medical complexity and substantial therapy      needs in context of that medical necessity cannot be provided a      less intensity care.  4. The patient has experienced substantial functional loss from her      baseline.  Premorbidly, she was independent.  As of my rehab      consult on August 20, 2008, she was 40% in bed mobility and      transfers max to total with ADLs.  Within the last 24 hours, the      patient has been min-to-mod assist for bed mobility, mod assist      transfers, max assist 2-3 steps in a size step fashion with hemi-      walker.  She is max total assist with ADLs.  Judging by the      patient's diagnosis, physical exam and functional history, she has      a potential for functional progress which will result in measurable      gains while inpatient rehab.  These gains will be of substantial      and practical use upon discharge to home in facilitating mobility      and self-care.  Interim changes in her medical status since my      consult are detailed above.  5. Physiatrist will provide 24-hour management medical needs as well      as oversight of therapy plans/treatment and provide guidance as      appropriate regarding interaction of the two.  Medical problem list      and plan are below.  6. 24-hour rehab nursing will assist in the management of the      patient's bowel and bladder function as well as nutrition, safety,      any other additional therapy concepts  and techniques.  7. PT will assess and treat for  balance lower extremity strength and      coordination, functional mobility, gait, safety, adaptive equipment      with goals supervision.  8. OT will assess and treat for upper extremity use and ADLs, adaptive      techniques, equipment, her muscular reeducation,      cognitive/perceptual training and adaptive techniques, and      equipment with goals supervision to occasional min assist.  9. Speech Language Pathology will assess and treat for cognition and      swallowing with goals modified independent.  10.Case management social worker will assess and treat for      psychosocial issues and discharge planning.  11.Team conferences will be held weekly to assess progress towards      goals and to determine barriers to discharge.  12.The patient has demonstrated sufficient medical stability and      exercise capacity to tolerate at least 3 hours of therapy per day      at least 5 days per week.  13.Estimated length of stay is 2-1/2 weeks.   PROGNOSIS:  Good.   MEDICAL PROBLEM LIST AND PLAN:  1. Anticoagulation with Coumadin with Lovenox bridge.  We will follow      INR serially and watch for any bleeding complications.  Hemoglobin      has been stable.  2. Dysphagia:  Continue with diet as above for speech language      pathology recommendations watching closely for signs and      aspiration.  We need to ensure that she is meeting her nutritional      requirements, however.  Considering the calorie count as well as      prealbumin.  Might need Megace trial as well.  3. Anxiety:  Klonopin and Ativan:  The patient also needs reassuring      and repetition due to memory issues.  4. Seizure prophylaxis with Keppra 500 mg q.12 h.  We will follow for      neuro serious side effects and overall tolerance.  5. GI:  We will increase Prilosec to t.i.d. per patient's request.      Ranelle Oyster, M.D.  Electronically Signed      ZTS/MEDQ  D:  08/24/2008  T:  08/25/2008  Job:  [id number]

## 2010-05-20 NOTE — Consult Note (Signed)
NAMEBREUNNA, [name]               ACCOUNT NO.:  [id number]   MEDICAL RECORD NO.:  [id number]          PATIENT TYPE:  OUT   LOCATION:  GYN                          [facility]ACILITY:  [facility]   PHYSICIAN:  De Blanch, M.D.DATE OF BIRTH:  [birth year]   DATE OF CONSULTATION:  DATE OF DISCHARGE:                                 CONSULTATION   CHIEF COMPLAINT:  Endometrial cancer.   INTERVAL HISTORY:  The patient returns today for continuing followup of  her endometrial cancer stage IIIC.  Since her last visit she has done  well.  She continues to have recovered from her intense radiation  therapy and chemotherapy.  She reports that she has occasional diarrhea.  Otherwise her functional status is much improved and she seems to be in  very good spirits.  She specifically denies any other GI or GU symptoms.  Has no pelvic pain, pressure, vaginal bleeding or discharge.   HISTORY OF PRESENT ILLNESS:  Patient underwent surgery of her  endometrial cancer in August of 2007.  Surgery included TAH, BSO and  pelvic lymphadenectomy.  She had extensive involvement of her pelvic  lymph nodes.  She had a vena cava filter in place and we therefore did  not perform an aortic lymphadenectomy.  She had no palpable and enlarged  nodes.  She was then treated with combination of Carboplatin and Taxol  for three cycles followed by pelvic radiation therapy following by three  additional cycles of Carboplatin and Taxol given in a sandwich  technique.  Patient tolerated the therapy poorly, but has gradually  recovered.   PAST MEDICAL HISTORY/MEDICAL ILLNESSES:  1. Anxiety and depression.  2. Deep vein thrombosis 1973, recurrence in 2007.  3. Pulmonary embolus.  4. Borderline hypertension.  5. Chronic insomnia.  6. Gastroesophageal reflux disease.  7. Rotator cuff repair.   CURRENT MEDICATIONS:  Lexapro, Paxil, Ativan, Protonix, Ambien and  Coumadin.   DATA REVIEWED:  MORPHINE, TYLENOL, CODEINE AND  DARVOCET.   PAST SURGICAL HISTORY:  1. TH BSO, pelvic lymphadenectomy.  2. Inferior vena cava filter placement.  3. Rotator cuff repair.   FAMILY HISTORY:  Negative for gynecology, breast or colon cancer.   SOCIAL HISTORY:  The patient is married.  She comes accompanied by her  husband.  She is a retired Geophysicist/field seismologist.  She does not smoke.   REVIEW OF SYSTEMS:  Ten point comprehensive review of systems negative  except as noted above.   PHYSICAL EXAMINATION:  Weight 140 pounds.  This is increased by 12  pounds from her last visit in September 2008.  Blood pressure 112/64,  pulse 80 respiratory rate 20.  GENERAL:  The patient is a healthy, African American female in no acute  distress.  HEENT:  Negative.  NECK:  Supple without thyromegaly.  There is no supraclavicular or  inguinal adenopathy.  ABDOMEN:  Soft, nontender. No masses, splenomegaly, ascites or hernias  noted.  PELVIC EXAM:  EGBUS.  Vagina, bladder, urethra are normal.  Cervix/uterus surgically absent.  Adnexa without masses.  RECTOVAGINAL:  Exam confirms.   IMPRESSION:  Stage IIIC endometrial papillary serous  carcinoma of the  endometrial status post intensive chemotherapy and radiation therapy  preceded by surgical resection.  Patient had extensive disease in her  pelvic lymph nodes, which were resected.  She seems to be clinically  free of disease and has had a significant recovery since our last visit.   She has been on Coumadin for over six months and given the fact she is  not on chemotherapy and has no evidence of active disease, I believe it  would be reasonable to discontinue her Coumadin.  We will transmit this  information to her primary physician, Dr. Kevan Ny, for him to make the  final decision.  She will return to see Dr. Roselind Messier in three months and  return to see [place] in six months.      De Blanch, M.D.  Electronically Signed     DC/MEDQ  D:  04/08/2007  T:  04/08/2007  Job:   [id number]   cc:   Telford Nab, R.N.  [address]  [place], [place] [address]   Vicente Serene I. Odogwu, M.D.  Fax: [phone]   Candyce Churn, M.D.  Fax: [phone]   Billie Lade, M.D.  Fax: [phone]

## 2010-05-20 NOTE — Discharge Summary (Signed)
NAMEIZZY, DOUBEK               ACCOUNT NO.:  [id number]   MEDICAL RECORD NO.:  [id number]          PATIENT TYPE:  IPS   LOCATION:  4030                         FACILITY:  [facility]   PHYSICIAN:  Erick Colace, M.D.DATE OF BIRTH:  [birth year]   DATE OF ADMISSION:  08/24/2008  DATE OF DISCHARGE:  09/08/2008                               DISCHARGE SUMMARY   DISCHARGE DIAGNOSES:  1. Right temporal lobe infarction.  2. 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  3. Dysphagia.  4. Anxiety.  5. Seizure disorder.  6. History of deep vein thrombosis with inferior vena cava filter.   This is a 77 year old [place] American female with endometrial cancer,  received chemotherapy radiation in 2007, admitted at [facility] for left-sided weakness.  MRI showed a right temporal lobe  infarction as well as superior sagittal sinus thrombus.  Initial concern  for multifocal cerebritis that was ruled out.  Noted seizure on August  6, loaded with Keppra.  EEG with diffuse slowing and encephalopathic  changes.  Placed on heparin, Coumadin for sagittal sinus thrombus.  Changed to subcutaneous Lovenox to INR greater than 2.  Modified barium  swallow on August18, placed on a mechanical soft diet.  She was admitted  for a comprehensive rehab program.   PAST MEDICAL HISTORY:  See discharge diagnoses.  No alcohol or tobacco.   ALLERGIES:  MORPHINE, CODEINE, DARVOCET, ASPIRIN, SULFA, and NEXIUM.   SOCIAL HISTORY:  She lives with husband in 1-level home, no steps to  entry.  Husband can assist as needed.   FUNCTIONAL HISTORY PRIOR TO ADMISSION:  Independent.   FUNCTIONAL STATUS UPON ADMISSION TO REHAB SERVICES:  Minimum to moderate  assist bed mobility, moderate assist transfers, max assist 2-3 side  steps with a hemi-walker.   MEDICATIONS PRIOR TO ADMISSION:  Ativan and Prilosec.   PHYSICAL EXAMINATION:  VITAL SIGNS:  Blood pressure 144/82, pulse 106,  temperature 98.6,  and respirations 17.  GENERAL:  This was an alert female, mildly lethargic, followed 3-step  commands, names person, place, and situation.  NEUROLOGIC:  Sensation intact to light touch.  LUNGS:  Clear to auscultation.  CARDIAC:  Regular rate and rhythm.  ABDOMEN:  Soft and nontender.  Good bowel sounds.   REHABILITATION HOSPITAL COURSE:  The patient was admitted to Inpatient  Rehab Services with therapies initiated on a 3-hour daily basis  consisting of physical therapy, occupational therapy, speech therapy,  and rehabilitation nursing.  The following issues were addressed during  the patient's rehabilitation stay.  Pertaining to Ms. Landress, right  temporal lobe infarction with superior sagittal sinus thrombus remained  stable.  She continued on Coumadin therapy.  Subcutaneous Lovenox was  ongoing until INR greater than 2.  Latest INR of 3.0 on September 06, 2008.  She would follow up with Dr. Johnella Moloney for ongoing Coumadin  therapy.  Noted seizure while hospital course, she remained on Keppra  500 mg every 12 hours.  She continued on Ativan as well as Klonopin as  prior to hospital admission for history of anxiety.  She did have some  issues in regards to her nutrition.  She was placed on Megace to help  stimulate her appetite; however, this was discontinued at the time of  discharge due to cost of this medication.  She did receive follow up  from Dr. Eula Flax of Neuropsychology for some relaxation  techniques related to her anxiety with good results.  The patient  received weekly collaborative interdisciplinary team conferences to  discuss estimated length of stay, family teaching, and any barriers to  discharge.  She was supervision to minimal assist, ambulate 150 feet,  minimal assist transfers, minimal assist bathing, minimal assist for  lower body dressing, minimal assist toilet transfers, anxious at times,  but she did not exhibit any unsafe behavior.   Latest labs  showed an INR of 3.0.  Sodium 138, potassium 3.7, BUN 7, and  creatinine 0.8.  Hemoglobin 12.6, hematocrit 36.3, platelet 303,000, and  WBC of 4.1.   DISCHARGE MEDICATIONS AT THE TIME OF THIS DICTATION:  1. Coumadin, latest dose of 4 mg, however this was adjusted      accordingly for a goal INR of 2.0-3.0.  2. Klonopin 1 mg at bedtime.  3. Keppra 500 mg every 12 hours.  4. Ativan 0.5 mg twice daily.  5. Prilosec 20 mg twice daily.   DIET:  Mechanical soft.   SPECIAL INSTRUCTIONS:  Home health nurse for Coumadin therapy with next  INR on Tuesday, September 7, results to Dr. Johnella Moloney.  She would  follow up with Dr. Faith Rogue at the Outpatient Rehab Service Office  as advised for history of stroke.  Home health therapies would be  ongoing as dictated per [employer].      Mariam Dollar, P.A.      Erick Colace, M.D.  Electronically Signed    DA/MEDQ  D:  09/06/2008  T:  09/07/2008  Job:  [id number]   cc:   Candyce Churn, M.D.  Marlan Palau, M.D.

## 2010-05-20 NOTE — Procedures (Signed)
CLINICAL HISTORY:  The patient is a 77 year old female with new onset of  seizure.  MRI showed cerebellitis.   CURRENT MEDICATIONS:  Ativan, Dilantin, NovoLog, acyclovir, Protonix,  Keppra, Diprivan, and phenytoin.   TECHNICAL COMMENT:  A 16-channel EEG was performed based on Standard  International 10-20 system, total recording time was 20.8 minutes, 1  channel dedicated to EKG which demonstrated sinus tachycardia of 104  beats per minute.   Upon awakening, the background activity was dysrhythmic, in the delta  range, there was also occasionally F4 sharp transient, but there is no  epileptiform discharge, background activity is fairly symmetric.   Photic stimulation and hyperventilation were not performed.  As tracing  going on, there was described right arm twitching without associated EEG  changes, there was also described mouth movement, again without  associated EEG changes, there was occasionally frontal predominant  triphasic waves bilaterally, also occasional appearance of F8 and F4, T4  sharp transient, but no evidence of epileptiform discharge.  The patient  remained unresponsive during recording.   In conclusion, this is an abnormal study.  The evidence of a diffuse  generalized slowing indicate moderately severe encephalopathic changes  indicating bilateral cerebral dysfunction.  In addition, there were also  occasionally appearance of F4, F8, T4 sharp transients, indicating  irritability of right parietal hemisphere, but there is no evidence of  epileptiform discharge.      Levert Feinstein, MD  Electronically Signed     ZH:YQMV  D:  08/10/2008 20:21:01  T:  08/11/2008 03:17:34  Job #:  [id number]

## 2010-05-20 NOTE — Op Note (Signed)
[name], [name]               ACCOUNT NO.:  [id number]   MEDICAL RECORD NO.:  [id number]          PATIENT TYPE:  INP   LOCATION:  1225                         FACILITY:  [facility]   PHYSICIAN:  Marlan Palau, M.D.  DATE OF BIRTH:  [birth year]   DATE OF PROCEDURE:  DATE OF DISCHARGE:                               OPERATIVE REPORT   HISTORY:  This is a 76 year old patient with a new onset of problems  with seizures.  The patient has an abnormal MRI of the brain with  evidence of a cerebritis.  Lumbar puncture was performed to evaluate the  cause of the cerebritis.   The patient was placed on the left side in the fetal position.  Low back  was cleaned with Betadine solution.  A 20 gauge spinal needle was  inserted in L3-4 interspace and approximately 18 mL of clear colorless  spinal fluid was removed for testing.  Opening pressure was 120 mm of  water.  Spinal fluid was very minimally cloudy.  Tube #1 was sent for  VDRL, cryptococcal antigen, angiotensin converting enzyme level, herpes  and TB PCR.  Tube #2 was sent for cytology.  Tube #3 was sent for cell  differential, glucose and protein.  Tube #4 was sent for routine  culture.  There were no complications of the above procedure.      Marlan Palau, M.D.  Electronically Signed     CKW/MEDQ  D:  08/09/2008  T:  08/09/2008  Job:  [id number]   cc:   [facility]

## 2010-05-23 NOTE — Discharge Summary (Signed)
NAMEKAPRICE, KAGE               ACCOUNT NO.:  [id number]   MEDICAL RECORD NO.:  [id number]          PATIENT TYPE:  INP   LOCATION:  1429                         FACILITY:  [facility]   PHYSICIAN:  Candyce Churn, M.D.DATE OF BIRTH:  02/11/1934   DATE OF ADMISSION:  09/26/2005  DATE OF DISCHARGE:  09/30/2005                                 DISCHARGE SUMMARY   DISCHARGE DIAGNOSES:  1. Failure to thrive - improving.  2. Situational depression and anxiety.  3. 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  4. Deep vein thrombosis/pulmonary embolus June of 2007 - IVC filter.  5. Recurrent epigastric discomfort, resolved.  6. Gastroesophageal reflux disease.  7. Fibrocystic breast disease.  8. Status post total abdominal hysterectomy with pelvic exploration, per      Dr. Serita Kyle in August of 2007.   DISCHARGE MEDICATIONS:  1. Remeron 7.5 mg p.o. q.h.s. for 3 days, then 15 mg p.o. q.h.s. for 3      days, then 22.5 mg p.o. q.h.s.  2. Ativan 1 mg p.o. q. 12 hours at 7 a.m. and 7 p.m., and 1 mg at night      p.r.n.  3. Warfarin 2.5 mg p.o. daily.  4. Megace 200 mg p.o. b.i.d.  5. Reglan 10 mg p.o. with meals and at bedtime.  6. Ultram 50 mg p.o. q. 4 hours p.r.n.   CONSULTATIONS:  Psychiatry, Antonietta Breach, M.D., September 29, 2005.   HOSPITAL COURSE:  Ms. Berrong is a very pleasant 76 year old female, recently  diagnosed with stage IIIC papillary serous endometrial cancer with staging  procedure in August of 2007.  She was hospitalized on September 03, 2005 for  anorexia, epigastric pain, nausea and weakness.  Had a normal EGD, normal  barium swallow and upper GI with a normal transit time.  She has had  persistent problems with depression, anxiety, insomnia and anorexia, and  presented with epigastric pain and left-sided chest pain on September 27, 2005.   Over the last several days she was noted to have  hypokalemia and, with  correction of this, symptoms have improved.  She has also complained of  actual anxiety with Paxil and Remeron is now being instituted for depressed  mood, and Ativan is being continued for anxiety; she seems to be tolerating  this well so far.   Over the last 48 hours she has eaten well and will be discharged home on the  above medications.   The patient did undergo repeat CT angiography of the chest, because of the  left chest pain on admission, and this showed no pulmonary thromboembolism.  Chest x-ray on September 26, 2005 showed no active cardiopulmonary disease.   DISCHARGE LABORATORIES:  Reveal a white count of 5800, hemoglobin of 13.5,  platelet count of 268,000 on September 26, 2005.  Electrolytes on September 29, 2005 revealed a sodium of 136, potassium of 3.8, chloride of 107, bicarb  of 24, BUN of 0, creatinine of 0.7 and blood sugar of 109.  LFTs on  September 26, 2005 were within normal limits except for a slightly high  total bili of 1.5 and an albumin slightly low at 3.3.  Total protein was 6.5  - within normal limits.  Calcium was 9.5 on September 29, 2005.  Cardiac  markers revealed a myoglobin of 34.7, CK-MB of less than 1 and troponin less  than 0.05.   DISPOSITION:  On the night prior to discharge she had a good night's sleep  with Remeron and her appetite seems to be improved.  Will plan to get  outpatient psychiatric counseling for her.  I will plan to follow up with  her in one week as an outpatient.      Candyce Churn, M.D.  Electronically Signed     RNG/MEDQ  D:  09/30/2005  T:  10/01/2005  Job:  [id number]   cc:   De Blanch, M.D.  [address]  [place]  [place] [address]   Jackquline Denmark. [name], M.D.  [address]  [place]  [place] [address]

## 2010-05-23 NOTE — Consult Note (Signed)
NAMEJAHNAE, [name]               ACCOUNT NO.:  [id number]   MEDICAL RECORD NO.:  [id number]          PATIENT TYPE:  WOC   LOCATION:  WOC                          FACILITY:  [facility]   PHYSICIAN:  John T. [name], M.D.    DATE OF BIRTH:  1934-01-26   DATE OF CONSULTATION:  DATE OF DISCHARGE:                                   CONSULTATION   CHIEF COMPLAINT:  Anorexia with belching.   HISTORY OF PRESENT ILLNESS:  The patient underwent total abdominal  hysterectomy with bilateral salpingo-oophorectomy and pelvic  lymphadenectomy, which was performed on August 11, 2005.  She was covered  with Lovenox because of a DVT, and in fact an IVC filter was placed  preoperatively.  Since surgery, the patient has had difficulty eating.  She  also describes difficulty in sleeping.  She states early satiety to the  extent that she can __________.  Her relative sadness is often augmented by  thinking about other people in similar situations.  On multiple occasions,  she has stated that medications caused abdominal burning such as Protonix  and Nexium.  She cries frequently and has worsening insomnia.  During her  most recent hospitalization on August 30, the patient had a normal EGD with  a recommendation the patient get an upper GI and small bowel follow-through.   PAST MEDICAL HISTORY:  Significant for deep venous thrombosis and pulmonary  embolism in 1975 while taking birth control pills as well as recent DVT and  PE.  Anxiety and mild depression.  Borderline blood pressure, chronic  insomnia and GERD.   MEDICATIONS:  Lexapro, Xanax and Ambien.   ALLERGIES:  INCLUDE MORPHINE, TYLENOL, CODEINE AND DARVOCET.   PAST SURGICAL HISTORY:  Rotator cuff repair.   FAMILY HISTORY:  Brother died of a brain tumor, another brother died of lung  cancer, sister died of stomach cancer and a sister died of pancreatic  cancer.  She also had a sister die of lung cancer but no other cancer of the  breast in parents  or grandparents.   CURRENT MEDICATIONS:  Coumadin, Carafate suspension.  Lexapro 5 mg orally  daily was begun during her most recent hospitalization but she has only been  taking this for few days.  Phenergan 12.5 mg p.r.n., alprazolam two to three  times daily.   EXAM:  VITAL SIGNS:  Stable and afebrile with weight 150 pounds (diminished  15 pounds from preop).  GENERAL:  The patient is depressed looking, with flat affect and depressed  facies.  There is no pathologic lymphadenopathy, back or CVA tenderness.  ABDOMEN:  Scaphoid, soft and third benign with well-healed incision without  erythema.  Bowel sounds normoactive and the abdomen is nontender.  EXTREMITIES:  Full strength and range of motion with 1+ pedal edema.  PELVIC:  External genitalia and BUS normal to inspection and palpation.  Bladder and urethra are normal.  Vagina is clear.  Bimanual and rectovaginal  examinations disclose absent uterus and cervix, no mass or nodularity   ASSESSMENT:  Stage III C papillary serous carcinoma of the endometrium on  recent pathology.  Likely depression with GI complaints.   PLAN:  I recommended that the patient undergo an upper GI with small bowel  follow-through as this was suggested by her gastroenterologist.  I had a  frank discussion with the patient about her likely depression and encouraged  her to continue her Lexapro with the thought that this could be increased if  it does not improve her overall situation within the next couple of weeks.  She should return to see [place] in approximately 2 weeks so we could plan  chemotherapy.      John T. [name], M.D.  Electronically Signed     JTS/MEDQ  D:  09/08/2005  T:  09/09/2005  Job:  [id number]

## 2010-05-23 NOTE — Discharge Summary (Signed)
[name], [name]               ACCOUNT NO.:  [id number]   MEDICAL RECORD NO.:  [id number]          PATIENT TYPE:  INP   LOCATION:  1313                         FACILITY:  [facility]   PHYSICIAN:  Candyce Churn, M.D.DATE OF BIRTH:  1934/05/20   DATE OF ADMISSION:  09/03/2005  DATE OF DISCHARGE:  09/06/2005                                 DISCHARGE SUMMARY   DISCHARGE DIAGNOSES:  1. Anorexia with belching.  2. Serous endometrial carcinoma with hysterectomy in August 2007, with      lymph node dissection, lymph nodes positive for neoplasm.  3. Proton pump inhibitor intolerance.  Protonix causes burning and Nexium      also causes abdominal pain.  4. Anxiety.  5. Situational depression.  6. Left lower quadrant deep venous thrombosis and right pulmonary embolism      in July 2007, with IVC filter placement, placed July 30, 2005.  7. Fibrocystic breast disease.  8. Episodic sinusitis.  9. Insomnia.  10.Mild elevations in blood pressure.  11.Chronic right shoulder pain.   DISCHARGE MEDICATIONS:  1. Coumadin 1 mg orally on September 07, 2005, and pro time will be checked      at [facility] on September 08, 2005.  2. Carafate suspension 10 cc orally every six hours.  3. Lexapro 5 mg orally q.a.m.  4. Phenergan 12.5 mg orally every six hours p.r.n. nausea.  5. Alprazolam 0.2 mg one-half tablet orally two to three times daily for      anxiety.   DISCHARGE DIET:  Bland diet including rice, grits, toast, noncream soups,  protein shakes and smoothies, and progress as tolerated.   CONSULTATIONS:  Dr. Doy Mince of gastroenterology.   PROCEDURE:  Upper endoscopy September 04, 2005.  Normal EGD.  Recommend that  the patient get upper GI and small bowel follow-through as an outpatient.   HOSPITAL COURSE:  [name] is a pleasant 76 year old female  recently diagnosed with serous cell endometrial carcinoma in July 2007, but  also experienced a left DVT and right PE at that  time.  She was started on  Lovenox and an IVC filter was placed preoperatively prior to her  hysterectomy performed in August 2007, per Dr. Serita Kyle.  Lymph  node dissection in the pelvis was apparently positive for tumor cells and  she is awaiting further therapy to be organized through Dr. Marcene Corning-  Sharol Given.   Subsequently, she developed dyspepsia, nausea, belching, and epigastric  pain, and was admitted on September 03, 2005, for further workup.  She is  starting to finally tolerate liquids and small amounts of solids, and she  had a normal EGD.   It is felt that perhaps PPI therapy was problematic and perhaps Reglan was  also problematic.   Will plan to discharge her home today on above diet and medications and will  reassess in 48 hours in my office.  She is taking liquids well.   Will be adding Carafate and simethicone to her regimen.   LABORATORY DATA:  Laboratories while hospitalized revealed discharge INR of  3.5 and will be holding Coumadin today.  Urinalysis on September 05, 2005,  revealed specific gravity 1.040, pH 6.5.  She was negative for nitrite.  Leukocyte esterase was large but there were many epithelial cells and there  were 2150 white cells and a few bacteria.  Other discharge labs include the  following:  Sodium 140, potassium 4.3, chloride 108, bicarb 28, BUN less  than 1, creatinine 0.8, glucose 117.  LFTs on September 03, 2005, revealed AST  of 33, ALT 33, alk phos 76, total bili 1.6, amylase 33, lipase 21, albumin  3.2.  On September 03, 2005, CK was 42.  Troponin was 0.04.  CK-MB was 0.9.  Calcium was 9.1 on September 06, 2005.  CBC on September 03, 2005, revealed a  white count of 6000, hemoglobin 14, MCV 89.2, RDW normal at 14, platelet  count 332,000.   The patient was discharged in improving condition but will try simethicone  and Carafate for dyspepsia, and, hopefully, she will continue to improve.   Needs to follow up with Dr. Stanford Breed later  this well, as well, and  will help her arrange that appointment.   CONDITION ON DISCHARGE:  Improved.      Candyce Churn, M.D.  Electronically Signed     RNG/MEDQ  D:  09/06/2005  T:  09/07/2005  Job:  [id number]

## 2010-05-23 NOTE — Consult Note (Signed)
[name], [name]               ACCOUNT NO.:  [id number]   MEDICAL RECORD NO.:  [id number]          PATIENT TYPE:  INP   LOCATION:  1429                         FACILITY:  [facility]   PHYSICIAN:  Antonietta Breach, M.D.  DATE OF BIRTH:  Nov 06, 1934   DATE OF CONSULTATION:  09/29/2005  DATE OF DISCHARGE:                                   CONSULTATION   REASON FOR CONSULTATION:  Depression and anxiety.   HISTORY OF PRESENT ILLNESS:  [name] is a 76 year old married  female admitted to the [facility] on September 26, 2005.   [name] has had several weeks of moderate depressed mood, low energy,  decreased concentration and difficulty sleeping.  She also has continued  with excessive worry, muscle tension, feeling on edge.  She was placed on  Lexapro originally and then was switched to Paxil.  Most recently, she has  continued to be progressively more anorexic and weak.  She has been started  on Megace by her primary care physician.  They were proposing Remeron, but  the patient refused it last night.  She is socially appropriate.  She is not  having any hallucinations or delusions.   PAST PSYCHIATRIC HISTORY:  The patient has a long-term history of excessive  worry, feeling on edge, muscle tension.  Previous psychotropic trials have  included Lexapro 10 mg daily and Xanax 0.25 mg q.6h. p.r.n., as well as  Ambien 10 mg q.h.s. p.r.n.   The patient has no history of alcohol or illegal drug use.   FAMILY PSYCHIATRIC HISTORY:  None known.   SOCIAL HISTORY:  The patient had several siblings, including at least 2  brothers and 3 sisters.  The patient's mother lived to be 66 years old.  She is married to a supportive husband.  She has 4 children and 9  grandchildren.  Occupation - she is a retired Architectural technologist and  retired Comptroller.  She lives with her supportive husband.  Religion -  [employer].   GENERAL MEDICAL PROBLEMS:  1. History of papillary  serous endometrial cancer, stage IIIC.  2. History of DVT and pulmonary embolus in June of 2007.  3. There is a history of recurrent epigastric pain with gastroesophageal      reflux disease.  4. History of fibrocystic breast disease.   OTHER SURGERIES:  1. A total abdominal hysterectomy.  2. Cholecystectomy.  3. Right shoulder surgery for rotator cuff tear.  4. A right medullary antrostomy.   MEDICATIONS:  1. Ativan 1 mg t.i.d.  2. Paxil 10 mg b.i.d. (the patient has refused since she was      hospitalized).  3. Remeron 15 mg p.o. q.h.s. (the patient has refused).   ALLERGIES:  1. MORPHINE SULFATE.  2. CODEINE.  3. PROPOXYPHENE NAPSYLATE.  4. TYLENOL.  5. SULFA.  6. NEXIUM.   LABORATORY DATA:  CBC unremarkable.  INR 1.8.  Metabolic panel unremarkable.  Creatinine is 0.7.  SGOT 24, SGPT 28, calcium 9.5.   REVIEW OF SYSTEMS:  CONSTITUTIONAL:  Afebrile.  HEAD:  No trauma.  EYES:  No  visual  changes.  EARS:  No hearing impairment.  NOSE:  No rhinorrhea.  MOUTH/THROAT:  No sore throat.  NEUROLOGIC:  Unremarkable.  PSYCHIATRIC:  As  above.  CARDIOVASCULAR:  No chest pain.  RESPIRATORY:  No cough or wheezing.  GASTROINTESTINAL:  The patient is still complaining of some nausea.  GENITOURINARY:  No dysuria.  SKIN:  Unremarkable.  MUSCULOSKELETAL:  No  deformities.  HEMATOLOGIC/LYMPHATIC:  Unremarkable.  ENDOCRINE/METABOLIC:  No diabetes.   PHYSICAL EXAMINATION:  VITAL SIGNS:  Temperature 98.4, pulse 84,  respirations 18, blood pressure 128/74, O2 saturation on room air 99%.  MENTAL STATUS EXAM:  [name] is an alert elderly female with  appropriate social reciprocity and good eye contact, sitting partially  reclined in a supine position in her hospital bed.  Her fund of knowledge  and intelligence are above average.  Her mood is mildly anxious.  Her affect  is mildly anxious.  Her thought process is logical, coherent, __________, no  looseness of associations, thought  content.  No thoughts of harming herself.  No thoughts of harming others.  No delusions.  No hallucinations.  She is  oriented to all spheres.  Her memory is intact to immediate, recent and  remote.  Her speech involves normal rate and prosody.  Her concentration is  within normal limits.  Her insight is partial.  She does recognize her  excessive worry.  Her judgment is intact.   ASSESSMENT:  1. Anxiety disorder, not otherwise specified, 293.84.  2. Generalized anxiety disorder.  3. Mood disorder, not otherwise specified, 293.83, depressed (functional,      as well as general medical factors).  4. Major depressive disorder, single episode, moderate to severe.  5. Axis II - deferred.  6. Axis III - see general medical problems.  7. Axis IV - general medical.  8. Axis V - 55.   [name] is not at risk to harm herself or others.  She agrees to call  emergency services for any psychiatric emergency symptoms.   INFORMED CONSENT:  The indications, alternatives and adverse effects of the  following were discussed with the patient:  Ativan for anti-acute anxiety  including the risk of sedation and dependence; Paxil for anti-anxiety, as  well as anti-depression synergizing with the Remeron; Remeron for anti-  depression as well as synergism with Paxil, and for the beneficial side  effects of Remeron including acute anti-insomnia, appetite simulation, and  some acute anti-anxiety benefits.   The patient understands the above information and declines continuing the  Paxil.  She wants to stop and start the Remeron.   RECOMMENDATIONS:  1. Continued education and ego-supportive psychotherapy.  2. Will start with a test dose of Remeron at 7.5 mg q 1800.  If the      patient tolerates that, would increase it by 7.5 mg per day to an      initial trial dose of 22.5 mg q.h.s.  If tolerated by the next week,      would then continue to titrate it up to 30 mg q.h.s. for her initial     trial  dose.  3. Would decrease the Ativan to 1 mg p.o. b.i.d. and 1 mg p.o. q.h.s.      p.r.n.  Would anticipate trying to take the patient off Ativan over a      several week period, however.  4. If the patient continues with her anxiety symptoms and inability to      taper the Ativan  further, would remind her that the restarting of an      SSRI such as Paxil will provide specific long-term anti-anxiety      benefits that Remeron cannot provide.  Please see the discussion below.   PRELIMINARY DISCHARGE PLANNING:  Would ask the case manager to set the  patient up with a psychiatric followup at one of the clinics attached to one  of the local hospitals - either [facility] outpatient psychiatric  clinic, the outpatient psychiatric clinic at [facility], or [facility].   Would ask that the patient also be set up with a psychotherapist as part of  her followup where she could potentially receive cognitive behavioral  therapy with deep breathing and progressive muscle relaxation training.   The patient's greatest potential benefit will likely come if she is able to  restart an SSRI with a slow titration to at least 50% of the maximum dose  for her age with an SSRI at an appropriate dosage combined with cognitive  behavioral therapy.  She could achieve her greatest benefit, particularly in  overcoming her anxiety symptoms.  The goal with long-term therapy would be  the eventual elimination of benzodiazepines.      Antonietta Breach, M.D.  Electronically Signed     JW/MEDQ  D:  09/29/2005  T:  09/30/2005  Job:  [id number]

## 2010-05-23 NOTE — H&P (Signed)
NAMEGESSELLE, FITZSIMONS               ACCOUNT NO.:  [id number]   MEDICAL RECORD NO.:  [id number]          PATIENT TYPE:  INP   LOCATION:  1429                         FACILITY:  [facility]   PHYSICIAN:  Hal T. Stoneking, M.D. DATE OF BIRTH:  [birth year]   DATE OF ADMISSION:  09/26/2005  DATE OF DISCHARGE:                                HISTORY & PHYSICAL   IDENTIFYING DATA:  Mrs. Nowaczyk is a 76 year old black female.  Mrs. Pillars was  diagnosed with papillary serous endometrial CA stage IIIc in June 2007.  At  that time she had a total abdominal hysterectomy, pelvic exploration, lymph  node dissection by Dr. Serita Kyle.  She has been followed by  oncology, Dr. Ronita Hipps, and is planned to have Taxol and carboplatin  chemotherapy followed by radiation.  During this clinical course she has  developed anorexia, epigastric pain, nausea and weakness resulting in an  admission August 30.  At that time she was seen by GI and had a normal upper  endoscopy, barium swallow and upper GI.  She has also been noted to have  normal GI transit time.  She has been tried on a number PPIs.  Currently on  Pepcid 20 mg a day, and recently Reglan 10 q.i.d. and Megace 200 b.i.d. has  been added.  She has had persistent feelings of weakness.  She also has felt  to be quite depressed and was originally placed on Lexapro, recently  switched to Paxil.  Her clinical course has been complicated by the  anorexia, weakness and insomnia.  She also complains of intermittent  constipation followed by diarrhea after she takes Milk of Magnesia.  Tonight  she developed a left-sided chest pain associated with dyspnea.  Of note, she  has had a prior pulmonary embolus but had an IVC filter placed in June 2007  and is on Coumadin.   PAST MEDICAL HISTORY:  She is intolerant of CODEINE, caused nausea; MORPHINE  SULFATE, nausea; SULFA, nausea.  She does not tolerate TYLENOL or DARVOCET.   Past medical history remarkable for:  1. Papillary serous endometrial cancer, stage IIIc.  2. DVT and pulmonary embolus in June 2007.  3. Recurrent epigastric pain.  4. GERD by history with negative upper endoscopy.  5. History of depression.  6. Fibrocystic breast disease.   Previous surgeries:  1. August 2007 she had total abdominal hysterectomy, pelvic exploration.  2. She has had a cholecystectomy in 1968.  3. She had right shoulder surgery for rotator cuff tear by Dr. Shelle Iron in      1999.  4. She had a right medullary antrostomy by Dr. Lucky Cowboy.   FAMILY HISTORY:  Brother died with a brain tumor.  One brother died with  stomach cancer.  Sister had breast cancer.  Father had an MI.   SOCIAL HISTORY:  She is married, very supportive husband.  She does not  smoke or drink.  She is a retired Architectural technologist at [employer].   REVIEW OF SYSTEMS:  No headache, no cough.  She does complain of the left-  sided chest wall  pain which feels to her like pressure.  She does have  intermittent epigastric pain.  No joint pain.   PHYSICAL EXAMINATION:  VITAL SIGNS:  Temperature 97.8, blood pressure  148/65, pulse 89, respiratory rate 20, O2 saturation 99%.  HEENT: Unremarkable.  LUNGS:  Clear.  HEART:  Regular rate and rhythm without murmur.  ABDOMEN:  Scaphoid, soft and nontender.  NEUROLOGIC:  Nonfocal.  She has a very flat affect.   LABORATORY DATA:  EKG revealed LVH.  White count 5800 with 57% neutrophils,  31% lymphs, 11% monocytes, platelet count 268.  Sodium 136, potassium 3.0,  chloride 102, bicarb 27, glucose 99, BUN 7, creatinine 0.8, calcium 9.6,  albumin 3.3, SGOT 24, SGPT 28, alk phos 59, total bili 1.5.  CK 31.4 with an  MB of less than 1, troponin less than 0.05.   ASSESSMENT:  1. Prolonged episode of weakness, nausea, epigastric pain and failure to      thrive.  Difficult to know if the current symptoms are attributable to      her cancer, depression or to some as-yet discovered malady.  Her      overall  clinical picture most consistent with depression.  2. Chest pain, new symptom, with history of pulmonary embolus in the past      and her past history of stage IIIc endometrial carcinoma.  Will      evaluate further with a CT scan of the chest.   PLAN:  Admit.  Will hydrate, replace her potassium, obtain CT scan of the  chest and suggest psychiatric consultation.  I have discussed this with the  patient.           ______________________________  Sunday Spillers. Pete Glatter, M.D.     HTS/MEDQ  D:  09/27/2005  T:  09/29/2005  Job:  [id number]   cc:   Jonny Ruiz T. [name], M.D.  [address]  [place]  [place] [address]   De Blanch, M.D.  [address]  [place]  [place] [address]

## 2010-05-23 NOTE — Op Note (Signed)
[name], [name]               ACCOUNT NO.:  [id number]   MEDICAL RECORD NO.:  [id number]          PATIENT TYPE:  AMB   LOCATION:  MATC                          FACILITY:  [facility]   PHYSICIAN:  Phil D. Okey Dupre, M.D.     DATE OF BIRTH:  05/30/1934   DATE OF PROCEDURE:  07/07/2005  DATE OF DISCHARGE:                                 OPERATIVE REPORT   PROCEDURE:  Fractional dilatation and curettage.   PREOPERATIVE DIAGNOSIS:  Postmenopausal bleeding.   POSTOPERATIVE DIAGNOSIS:  Postmenopausal bleeding pending pathology report.   SURGEON:  Javier Glazier. Rose, M.D.   ASSISTANT:  Dr. Mayford Knife   Under satisfactory MAC sedation with the patient in the dorsal lithotomy  position, the perineum and vagina were prepped and draped in the usual  sterile manner. Bimanual pelvic examination under anesthesia revealed a  uterus of approximately [redacted] weeks gestational size, freely movable, the  adnexa could not be palpated, the cul-de-sac was free.  A weighted speculum  was placed in the posterior fourchette of the vagina through a marital  introitus and BUS which were within normal limits.  The vagina was clean and  somewhat atrophic. The cervix was clean and parous and grasped with a single-  tooth tenaculum.  The uterine cavity was sounded to a depth of 12 cm.  The  cervical os was dilated to a #8 Hagar dilator.  The endocervical canal was  curetted vigorously with a small sharp curette and this was sent for  pathological diagnosis separately. The uterine cavity was explored with  stone forceps followed by curettage with a small sharp curette and a larger  sharp curette. The whole area seemed to be gritty with the exception of the  left fundal area which was markedly soft and we were very cautious at  curetting that area. A large amount of endometrial tissue was obtained and  sent for pathological diagnosis.  The tenaculum was removed from the cervix  and the patient was transferred to the recovery room in  satisfactory  condition with approximately 100 mL blood loss.           ______________________________  Javier Glazier Okey Dupre, M.D.     PDR/MEDQ  D:  07/07/2005  T:  07/07/2005  Job:  [id number]

## 2010-05-23 NOTE — Discharge Summary (Signed)
[name], [name]               ACCOUNT NO.:  [id number]   MEDICAL RECORD NO.:  [id number]          PATIENT TYPE:  INP   LOCATION:  1610                         FACILITY:  [facility]   PHYSICIAN:  Roseanna Rainbow, M.D.DATE OF BIRTH:  11/30/1934   DATE OF ADMISSION:  08/11/2005  DATE OF DISCHARGE:  08/14/2005                                 DISCHARGE SUMMARY   CHIEF COMPLAINT:  The patient is a 76 year old female with a serous  carcinoma of the endometrium, who presents for surgical management.  Please  see the dictated history and physical for further details.   HOSPITAL COURSE:  She was admitted and underwent a total abdominal  hysterectomy and bilateral salpingo-oophorectomy and pelvic lymphadenectomy.  Please see the dictated operative summary.  On postoperative day #1 her  hemoglobin was 12.6, potassium 3.3.  she complained of nausea and her diet  was restricted to sips of clears.  She continued to have gastrointestinal  complaints, i.e., decreased appetite and heartburn.  A nutritionist was  consulted.  On postoperative day #3 she was tolerating a regular diet,  although it was noted that there was decreased oral intake.  Her Lovenox was  resumed prior to discharge home.   DISCHARGE DIAGNOSIS:  Endometrial adenocarcinoma, serous type, stage II-B.   PROCEDURE:  Total abdominal hysterectomy, bilateral salpingo-oophorectomy,  and pelvic lymphadenectomy.   CONDITION:  Stable.   DIET:  Regular.   ACTIVITY:  Progressive activity.   MEDICATIONS:  1. Lovenox 80 mg subcu b.i.d.  2. Percocet one to two tablets every 6 hours as needed.  3. Resume other home medications.   DISPOSITION:  The patient was to follow up with Dr. Kevan Ny as well as with at  the GYN oncology office for staple removal.      Roseanna Rainbow, M.D.  Electronically Signed     LAJ/MEDQ  D:  10/01/2005  T:  10/03/2005  Job:  [id number]   cc:   Telford Nab, R.N.  [address]  [place], [place] [address]   Candyce Churn, M.D.  Fax: [phone]   Phil D. Okey Dupre, M.D.

## 2010-05-23 NOTE — H&P (Signed)
NAMEKAMILIA, [name]               ACCOUNT NO.:  [id number]   MEDICAL RECORD NO.:  [id number]          PATIENT TYPE:  INP   LOCATION:  1424                         FACILITY:  [facility]   PHYSICIAN:  Candyce Churn, M.D.DATE OF BIRTH:  February 03, 1934   DATE OF ADMISSION:  07/28/2005  DATE OF DISCHARGE:                                HISTORY & PHYSICAL   CHIEF COMPLAINT:  Right chest pain.   [name] is a very pleasant 76 year old female with a 24-hour  history of right chest pain and has been diagnosed with a pulmonary embolus  per pulmonary angiogram involving the right lung.  She also has left  popliteal DVT by venous Dopplers.   She has a history of DVT and PE in 1975, 32 years ago, while taking oral  contraceptives.   She recently had a D&C, approximately 3 weeks ago, and has an apparent  uterine cancer with a 3 x 3 cm mass in her right uterus.  She had an  apparent D&C on July 07, 2005, revealing a probable serous carcinoma of the  endometrium.   D&C performed by Dr. Elinor Dodge, of [facility].  She is admitted  now for anticoagulation therapy and further workup.  We will need to know  how long to treat for pulmonary embolus and DVT prior to probable surgery  for uterine cancer.  Discuss with hematology and Dr. Aneta Mins rose.   PAST MEDICAL HISTORY:  1.  PE and DVT in 1975 on oral contraceptives.  2.  GERD.  3.  Chronic right shoulder injury.  4.  __________  5.  Anxiety and mild depression.  6.  Borderline elevations of blood pressure in the past.  7.  Episodic sinusitis with as mucocele on the right maxillary sinus in      2001.  8.  Fibrocystic breast disease.  9.  Insomnia.   PAST SURGICAL HISTORY:  1.  Cholecystectomy in 1968.  2.  Right shoulder surgery per Dr. Shelle Iron in 1999.  3.  Surgery on left ankle secondary to ankle fracture in the past.  4.  Right maxillary antrostomy per Dr. Lucky Cowboy in 2001 or 2002.   MEDICATIONS:  Include Nexium 40 mg  a day.   ALLERGIES:  She is intolerant to MORPHINE SULFATE, CODEINE, and DARVOCET.   HEALTH MAINTENANCE:  Last tetanus in 1998.  Last Pap smear performed May 29, 2004.  Mammograms performed routinely at the breast center.  Flexible  sigmoidoscopy November 2004 to 60 cm was normal.   FAMILY HISTORY:  Significant for brain cancer in her brother, stomach cancer  in another brother, breast cancer in a sister.  Father died of an MI in his  79s.  Mother may have lived to be approximately 74 years old.   SOCIAL HISTORY:  The patient is married.  No tobacco use or alcohol use.  She used to work in [employer].   REVIEW OF SYSTEMS:  Vaginal spotting about a month ago and was seen at  [facility] with above workup being performed by Dr. Kristen Loader.  She  denies any chest pain until last night.  Has not complained of any specific  leg pain.  Her legs have been somewhat uncomfortable bilaterally as of late.  Denies shortness of breath or fever.   PHYSICAL EXAMINATION:  GENERAL:  Elderly female complaining of belching  today and also right chest pain on inspiration that is improved with  Toradol.  VITAL SIGNS:  Temperature 97.3, pulse 81 and regular, respiratory rate 20  and unlabored, blood pressure 115/55, oxygen saturation on room air 97%.  Weight is 76 kg.  HEENT:  Atraumatic and normocephalic.  NECK:  Supple without JVD or mass.  CHEST: Clear to auscultation.  She does have some mild pleuritic chest pain  on the right.  CARDIAC: Regular rate and rhythm without murmurs or gallops.  ABDOMEN: Soft with mild distention.  Normal bowel sounds.  She is belching  some.  EXTREMITIES:  Without obvious deformity.  She had previous right shoulder.  Left popliteal fossa is without palpable pain.  She has no tender cords in  either lower extremity, and Homan's signs are negative bilaterally.  NEUROLOGIC:  Exam nonfocal.   Chest CT reportedly has a right lung pulmonary embolus, but formal  report is  pending.   Ultrasound of her lower extremities apparently revealed a left popliteal  DVT.  The formal results are pending.   Results are reported to me via the emergency room physician.   Chest x-ray has minimal basilar atelectasis bilaterally.   Ultrasound from July 07, 2005, revealed a 3 x 3 cm mass in the right proximal  uterus consistent with a neoplasm or polyp.   Laboratories today reveal a white count of 12,400, hemoglobin 14.2, platelet  count 216,000.  CK 77.  LFTs are normal except for a mildly elevated total  bilirubin and indirect bilirubin at 2.1 and 1.8, respectively.  The 1.8 is  likely consistent with Gilbert's syndrome.  D-dimer is elevated at 3.3.  Sodium 140, potassium 3.6, chloride 100, bicarb 27, BUN 11, creatinine 0.9,  glucose 107.   ASSESSMENT:  A 76 year old female with uterine mass consistent with serous  carcinoma per pathology review.  She now has a deep vein thrombosis and  pulmonary embolus.  She had a pulmonary embolus 32 years ago.   PLAN:  We will admit and treat with subcutaneous Lovenox and oral Coumadin.  We will need to monitor and also consult GYN and hematology about plans for  further intervention for uterine cancer in the face of DVT and PE and  necessary anticoagulation.   Chest pain has been responding to Toradol, and we will try simethicone for  belching with continuation of PPI therapy and will also use Tums 2 p.o.  b.i.d.   For anxiety and depression which seems quite evident to me in discussion  with her tonight, will start Lexapro 5 mg a day and use Xanax 0.25 mg q. 6 h  p.r.n..   For sleep, will try Ambien.      Candyce Churn, M.D.  Electronically Signed     RNG/MEDQ  D:  07/28/2005  T:  07/28/2005  Job:  [id number]   cc:   Javier Glazier. Okey Dupre, M.D.

## 2010-05-23 NOTE — Consult Note (Signed)
[name]               ACCOUNT NO.:  [id number]   MEDICAL RECORD NO.:  [id number]          PATIENT TYPE:  OUT   LOCATION:  GYN                          FACILITY:  [facility]   PHYSICIAN:  De Blanch, M.D.DATE OF BIRTH:  August 28, 1934   DATE OF CONSULTATION:  01/21/2006  DATE OF DISCHARGE:                                 CONSULTATION   GYN ONCOLOGY CLINIC   CHIEF COMPLAINT:  Endometrial cancer.   INTERVAL HISTORY:  The patient returns today for further evaluation of a  stage IIIC endometrial carcinoma.  She is currently, approximately  halfway through her external beam radiation therapy.  She previously has  received 3 cycles of carboplatin and Taxol under the direction of Dr.  Dalene Carrow.  We anticipate completing her external beam radiation therapy,  adding vaginal brachytherapy followed by 3 additional courses of  carboplatin and Taxol.  Her course has been somewhat complicated by  severe depression.  The patient is currently on Paxil, and seems to be  improving.  She comes accompanied by her husband today.  She is taking  Coumadin for deep vein thrombosis and having no problems with bleeding.  Coumadin is being monitored by Dr. Kevan Ny.   Specifically, the patient denies any GI or GU symptoms, has no pelvic  pain, pressure, vaginal bleeding, or discharge.  She is not having any  diarrhea from the radiation therapy.  She does have some neuropathy in  her feet although nothing in her hands.   HISTORY OF PRESENT ILLNESS:  The patient underwent hysterectomy,  bilateral salpingo-oophorectomy, and pelvic lymphadenectomy in August  2007.  final pathology showed a stage IIIC papillary serous carcinoma of  the endometrium.  She had metastases to bilateral pelvic lymph nodes.   PAST MEDICAL HISTORY:  1. Deep vein thrombosis and pulmonary miles in 1975 while taking birth      control pills. The patient may also developed DVT and had a vena      cava filter placed  preoperatively prior to her hysterectomy.  2. Anxiety and depression.  3. Borderline hypertension.  4. Chronic insomnia.  5. GERD.  6. Endometrial cancer, as above.  7. Rotator cuff repair.   MEDICATIONS:  Lexapro, Paxil, Ativan, Protonix, Ambien, and Coumadin.   DRUG ALLERGIES:  MORPHINE, TYLENOL, CODEINE, and DARVOCET.   FAMILY HISTORY:  Negative for gynecologic breast or colon cancers.   SOCIAL HISTORY:  The patient is married.  She is a retired Investment banker, operational.  She comes accompanied by her husband.  She does not smoke.   REVIEW OF SYSTEMS:  A 10-point comprehensive review of systems negative  except as noted above.   PHYSICAL EXAMINATION:  VITAL SIGNS:  Weight 124 pounds, blood pressure  112/74.  GENERAL:  The patient is a slender, sad African-American female in no  acute distress.  HEENT:  Negative.  NECK:  Supple without thyromegaly.  There is no supraclavicular or  inguinal adenopathy.  ABDOMEN:  Soft, nontender.  No masses, organomegaly, ascites, or hernias  noted.  All incisions are well-healed.  PELVIC EXAM:  EG/BUS vagina, bladder, urethra are normal.  Vaginal cuff  is well-healed.  Cervix and uterus is surgically absent.  Adnexa without  masses.  RECTOVAGINAL EXAM:  Confirms.   IMPRESSION:  1. Stage IIIC endometrial carcinoma (papillary serous).  The patient      is currently receiving radiation therapy and seems to be tolerating      it well.  She does have some peripheral neuropathy secondary to her      prior treatment with Taxol; and this will have to be evaluated      before we begin further Taxol.  We will have the patient return to      see me in 6-8 weeks.      De Blanch, M.D.  Electronically Signed     DC/MEDQ  D:  01/21/2006  T:  01/21/2006  Job:  [id number]   cc:   Telford Nab, R.N.  [address]  [place], [place] [address]   Vicente Serene I. Odogwu, M.D.  Fax: [phone]   Candyce Churn, M.D.  Fax: [phone]   Billie Lade, M.D.  Fax: [phone]

## 2010-05-23 NOTE — Op Note (Signed)
[name], [name]               ACCOUNT NO.:  [id number]   MEDICAL RECORD NO.:  [id number]          PATIENT TYPE:  INP   LOCATION:  0001                         FACILITY:  [facility]   PHYSICIAN:  De Blanch, M.D.DATE OF BIRTH:  03/22/1934   DATE OF PROCEDURE:  08/11/2005  DATE OF DISCHARGE:                                 OPERATIVE REPORT   PREOPERATIVE DIAGNOSIS:  Endometrial carcinoma (papillary serous histology);  a recent history deep vein thrombosis and pulmonary embolism.   POSTOPERATIVE DIAGNOSIS:  Endometrial carcinoma (papillary serous  histology); a recent history deep vein thrombosis and pulmonary embolism.   PROCEDURE:  Exploratory laparotomy, total abdominal hysterectomy bilateral  salpingo-oophorectomy, pelvic lymphadenectomy.   SURGEON:  De Blanch, M.D.   FIRST ASSISTANT:  Roseanna Rainbow, M.D., and Telford Nab, R.N.   ANESTHESIA:  General with orotracheal tube.   ESTIMATED BLOOD LOSS:  100 mL.   SURGICAL FINDINGS:  At the time of exploratory laparotomy, the uterus had a  large fundal fibroid on the right side that measured approximately 7 cm in  diameter.  The remainder of the uterus, tubes and ovaries appeared normal.  The pelvic lymph nodes and the obturator space were hard and firm and  somewhat fixed to the obturator nerve and the pelvic sidewall.  These were  all grossly resected.  Palpation of the aortic lymph nodes revealed no  enlarged nodes.  There were adhesions in the right upper quadrant from the  patient's prior cholecystectomy.   Because the patient had a vena cava filter in place, it was elected not to  proceed with periaortic lymphadenectomy.  At the completion of the surgical  procedure, no residual disease was noted grossly.   PROCEDURE:  The patient was brought to the operating room and after  satisfactory attainment of general anesthesia was placed in a modified  lithotomy position in [place] stirrups.  The  anterior abdominal wall, perineum  and vagina were prepped with Betadine.  A Foley catheter was inserted.  The  patient was draped.  The abdomen was entered through a low midline incision.  Peritoneal washings were obtained from the pelvis.  The upper abdomen and  pelvis were explored with the above-noted findings.  The uterus was grasped  with long Kelly clamps and the round ligaments divided.  The retroperitoneal  spaces were opened by incising the peritoneum along the lateral pelvic  sidewall.  The paravesical and pararectal spaces were opened, identifying  the vessels and the ureter.  The ovarian vessels were skeletonized, clamped,  cut, free tied and suture ligated using 2-0 Vicryl.  The bladder flap was  advanced through sharp and blunt dissection.  Uterine vessels were  skeletonized and then clamped, cut and suture ligated.  The paracervical and  cardinal ligaments were clamped, cut and suture ligated.  Vaginal angles  were crossclamped and the vagina transected from its connection with the  cervix.  The uterus, cervix, tubes and ovaries were sent to pathology for  frozen section revealing a minimal amount of invasion.  The vaginal angles  were transfixed with 0 Vicryl and the central portion  of the vagina closed  with interrupted figure-of-eight sutures of 0 Vicryl.  The retroperitoneal  spaces were further opened.   Pelvic lymphadenectomy was then performed, excising lymph nodes from the  external iliac artery and vein, internal iliac artery and obturator fossa.  As noted above, the obturator lymph nodes were slightly enlarged and very  firm and fixed along the pelvic sidewalls, and the obturator nerve ran  through the central portion of these nodes.  Using careful dissection and  freeing the nerve away, the nerve was preserved.  All grossly enlarged and  hard lymph nodes were removed.  This was performed on both sides of the  pelvis.   As noted above, because the patient had a  vena cava filter in place, we felt  it was not safe to proceed with aortic node dissection, especially in a  patient who would be subsequently anticoagulated.   Pelvis was irrigated with saline and found to be hemostatic.  Packs and  retractors were removed.  The anterior abdominal wall was closed in layers,  the first being a running mass closure using #1 PDS.  Subcutaneous tissue  was irrigated.  Hemostasis achieved with cautery.  Skin was closed with skin  staples.  A dressing was applied.  The patient was awakened from anesthesia  and taken to the recovery room in satisfactory condition.  Sponge, needle  and instrument counts were correct x2.      De Blanch, M.D.  Electronically Signed     DC/MEDQ  D:  08/11/2005  T:  08/11/2005  Job:  [id number]   cc:   Telford Nab, R.N.  [address]  [place], [place] [address]   Javier Glazier. Okey Dupre, M.D.   Candyce Churn, M.D.  Fax: [phone]

## 2010-05-23 NOTE — Consult Note (Signed)
[name], [name]               ACCOUNT NO.:  [id number]   MEDICAL RECORD NO.:  [id number]          PATIENT TYPE:  INP   LOCATION:  1313                         FACILITY:  [facility]   PHYSICIAN:  Shirley Friar, MDDATE OF BIRTH:  1934/06/30   DATE OF CONSULTATION:  DATE OF DISCHARGE:                                   CONSULTATION   REASON FOR CONSULTATION:  Failure to thrive, abdominal pain.   HISTORY OF PRESENT ILLNESS:  [name] is a pleasant 76 year old black  female who was diagnosed with endometrial carcinoma in June2007 and  developed a deep venous thrombosis and pulmonary embolus in July2007.  She  also underwent an IVC filter placement in July2007.  In August2007 she had a  hysterectomy and pelvic exploratory surgery done and has been on chronic  Coumadin since late August2007.  She presented to Dr. Kevan Ny with persistent  epigastric abdominal pain and inability to eat food due to burning  sensation.  She says that every time she tried to eat food, she would have  severe burning in his chest down into her abdomen and could only tolerate  water.  She has never had problems with heartburn or indigestion in the past  and is very concerned about her inability to eat.  She was placed on  Prevacid as an outpatient that did not help with her symptoms and caused a  lot of gas.  Currently she describes a burning sensation when she swallows  anything but water and epigastric abdominal pain.  She also was on Nexium in  the past per chart history, but said that it caused abdominal pain as well.   PAST MEDICAL HISTORY:  1. History of GERD.  2. Anxiety.  3. History of depression.  4. Fibrocystic breast disease.   All others negative except as stated above.   MEDICINES ON ADMISSION:  Reglan, Ambien, Nexium, Xanax, Lexapro, Coumadin  2.5 mg daily.   ALLERGIES:  1. CODEINE.  2. MORPHINE.  3. SULFA DRUGS.   REVIEW OF SYSTEMS:  Negative except as stated above.   PHYSICAL EXAM:   VITAL SIGNS:  Afebrile, pulse 104, blood pressure 118/86,  weight 149 pounds.  GENERAL:  Alert, no acute distress.  HEENT:  Nonicteric sclerae.  CHEST:  Clear to auscultation bilaterally.  CARDIOVASCULAR:  Regular rate and rhythm.  ABDOMEN:  Mild epigastric tenderness, otherwise nontender, soft,  nondistended, positive bowel sounds.   LABS:  Pending.   IMPRESSION:  [age]-year-old black female with recent pulmonary embolus  and DVT in the setting of a diagnosis of endometrial carcinoma leading to  hysterectomy and pelvic exploratory surgery.  She presents with persistent  epigastric abdominal pain, odynophagia and failure to thrive.  My main  concern would be erosive esophagitis versus infectious esophagitis but  peptic ulcer disease is also a possibility.  The patient will likely need  Carafate slurry in addition to IV Protonix.  We will plan on doing an upper  endoscopy on September 04, 2005 to further assess for esophagitis and peptic  ulcer disease.  We will give GI cocktail today.  Agree with  abdominal x-ray  and we will follow up on baseline labs.  Thank you for this consult.      Shirley Friar, MD  Electronically Signed     VCS/MEDQ  D:  09/03/2005  T:  09/04/2005  Job:  [id number]   cc:   Candyce Churn, M.D.  Fax: [phone]

## 2010-05-23 NOTE — Consult Note (Signed)
NAMEABRIA, [name]               ACCOUNT NO.:  [id number]   MEDICAL RECORD NO.:  [id number]          PATIENT TYPE:  OUT   LOCATION:  GYN                          FACILITY:  [facility]   PHYSICIAN:  John T. [name], M.D.    DATE OF BIRTH:  06/17/1934   DATE OF CONSULTATION:  09/22/2005  DATE OF DISCHARGE:                                   CONSULTATION   CHIEF COMPLAINT:  Stage IIIC endometrial cancer.   HISTORY OF PRESENT ILLNESS:  This patient underwent VH with BSO and pelvic  lymphadenectomy performed August 11, 2005.  She was covered with Lovenox  because of a DVT with IVC filter placed preoperatively.  Since surgery, the  patient has continued to have difficulty eating and sleeping.  She states  early satiety and diminished appetite.  She has belching and GERD symptoms  and has been evaluated with normal EGD and more recently barium swallow and  upper GI series.  The patient was found to have inability to swallow a large  bolus of fluid but no evidence for focal abnormality of the esophagus,  stomach or proximal small bowel and ended up on delayed small bowel series  having normal transient time without evidence of obstruction.  She continues  to have problems with anxiety and insomnia.  She continues to have crying  spells.  She denies suicidal ideation. She was recently started again on  Protonix, has been using Ativan for anxiety and Paxil was added to her  medications recently.   PAST MEDICAL HISTORY:  Significant for:  1. DVT and pulmonary embolism in 1975 while taking birth control pills as      well as her recent DVT and PE.  2. Anxiety and depression.  3. Borderline hypertension.  4. Chronic insomnia.  5. GERD.  6. Endometrial cancer surgery as above.  7. Rotator cuff repair.   MEDICATIONS:  Lexapro, Paxil, Ativan, Protonix, Ambien, Coumadin.   ALLERGIES:  MORPHINE, TYLENOL, CODEINE, DARVOCET.   FAMILY HISTORY:  A brother died of a brain tumor.  Another brother died  of  lung cancer.  A sister has stomach cancer and a sister has pancreatic  cancer.  Yet another sister died of lung cancer.  There are no breast, other  gynecological or colon malignancies.   SOCIAL HISTORY:  Married and retired Geologist, engineering.  Denies tobacco or  ethanol use.   REVIEW OF SYSTEMS:  Other than noted above, negative on ten comprehensive  systems.   PHYSICAL EXAMINATION:  VITAL SIGNS:  Weight 141 pounds (decreased nine  pounds over the past month).  Blood pressure 105/78.  GENERAL:  The patient has depressed affect.  Alert and oriented x3.  LYMPH NODES:  There is no pathologic lymphadenopathy.  ABDOMEN:  No distention, ascites or mass.  Incision is well-healed without  hernia or tenderness.  EXTREMITIES:  Full strength and range of motion without edema or cellulitis.  PELVIC: External genitalia and BUS are normal.  Bladder and urethra are well  supported and vagina clear with healed cuff.  Bimanual and rectovaginal  examinations disclose no mass or nodularity.  Absent uterus and cervix.   ASSESSMENT:  Stage IIIC endometrial carcinoma.  Continued gastrointestinal  symptoms without focal findings on endoscopy or radiographic studies.  Likely reactive depression.   PLAN:  Had a long discussion again with the patient regarding her apparent  depression.  She is currently not interested in seeking counseling although  this was recommended.  I emphasized to her the fact that we had debulked all  palpable disease at the time of her surgery and  that there is a potential  for long term results with additional therapy.  Ideally, we would consider  chemotherapy followed by radiation or sandwich treatment, but given her  current poor performance status, I believe that it best to begin with  chemotherapy as she is at significant risk for systemic failure.  I  recommended scheduled Reglan and Mylanta and she was given a prescription  for Reglan 10 mg q.i.d.  We will also begin  Megace 200 mg b.i.d. as this may  stimulate the appetite.  We will set up an appointment in medical oncology  within the next couple of weeks so that we can at least start to make  progress on treatment.  I would anticipate seeing her after three cycles of  Taxol and carboplatin chemotherapy so that we can assess the potential for  sandwich therapy following __________  radiotherapy.      John T. [name], M.D.  Electronically Signed     JTS/MEDQ  D:  09/22/2005  T:  09/24/2005  Job:  [id number]   cc:   Telford Nab, R.N.  [address]  [place], [place] [address]   Candyce Churn, M.D.  Fax: [phone]   Phil D. Okey Dupre, M.D.

## 2010-05-23 NOTE — H&P (Signed)
[name], [name]               ACCOUNT NO.:  [id number]   MEDICAL RECORD NO.:  [id number]          PATIENT TYPE:  INP   LOCATION:  1313                         FACILITY:  [facility]   PHYSICIAN:  Candyce Churn, M.D.DATE OF BIRTH:  1934-06-02   DATE OF ADMISSION:  09/03/2005  DATE OF DISCHARGE:                                HISTORY & PHYSICAL   CHIEF COMPLAINT:  Failure to thrive and abdominal pain.   HISTORY OF PRESENT ILLNESS:  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  She suffered a left DVT and probable right lower lobe pulmonary  embolism on July 28, 2005, underwent IVC filter placement on July 30, 2005,  and was started on Lovenox in anticipation of a hysterectomy and pelvic  exploratory surgery, ultimately performed in August 2007, by De Blanch, M.D.  Since discharge from the hospital she has had epigastric pain  and inability to eat secondary to pain and is now being readmitted for this  reason and failure to thrive.   She apparently has neoplastic disease involving the pelvic lymph nodes and  will need systemic chemotherapy ultimately.   She was found to have a clot in her IVC filter within the last 2 weeks and  the IVC filter was therefore not removed.  She is now on Coumadin therapy.   PAST MEDICAL HISTORY:   ALLERGIES:  1. CODEINE causes nausea and abdominal cramps.  2. MORPHINE causes nausea.  3. SULFA causes nausea and vomiting.   MEDICATIONS:  1. Reglan 5 mg before meals.  2. Ambien 5-10 mg p.o. q.h.s. p.r.n. insomnia.  3. Nexium 40 mg daily.  This may be causing abdominal pain.  4. Xanax 0.25 mg p.o. q.6h. p.r.n.  5. Lexapro 5 mg that was supposed to be started in the last several days      but has not been started.  6. Coumadin 2.5 mg daily over the last 3 days.  Coumadin was started on      August 26, 2005.  Last INR was 2.5 on August 28, 2005.   MEDICAL HISTORY:  1. Chronic right shoulder pain secondary to rotater cuff disease.  2. GERD.  3. Anxiety.  4. Mild depression.  5. Episodic sinusitis.  6. Fibrocystic breast disease.  7. Insomnia.  8. DVT/PE.  9. Borderline elevations in blood pressure.  10.Serous endometrial carcinoma as mentioned above.   SURGICAL HISTORY:  1. Cholecystectomy in 1968.  2. Right shoulder surgery in 1999, Dr. Shelle Iron.  3. Surgery on the left ankle secondary to fracture in the past.  4. Right medullary antrostomy per Lucky Cowboy, MD, in 2001 or 2002.  5. Recent pelvic surgery per Dr. De Blanch in August 2007 with      hysterectomy and pelvic lymph node dissection.   FAMILY HISTORY:  Significant for brain cancer in her brother, stomach cancer  in another brother, breast cancer in her sister.  Father died of an MI in  his 72s.  Mother may have lived to be approximately  74 years old.   SOCIAL HISTORY:  She is married, no tobacco or alcohol use.  Occupation:  Used to work in [employer].   REVIEW OF SYSTEMS:  Anorexia is present.  Also positive for nausea and upper  abdominal pain when she tries to take medication or eat food.  Her breath  smells ketotic currently.  No shortness of breath or fever.  She just feels  profoundly weak.   PHYSICAL EXAMINATION:  GENERAL:  An elderly female with a complaint of  epigastric pain.  VITAL SIGNS:  Weight 149, temperature 98.4, pulse 104 and regular.  Blood  pressure 118/86.  Weight in September 2006 was 183 and on August 10, 2005,  was 158.  HEENT:  Benign.  Temples are slightly cachectic.  Oropharynx is only  minimally moist.  Tongue is not shrunken.  NECK:  Supple without JVD or mass or lymphadenopathy or thyromegaly.  CHEST:  Clear to auscultation.  CARDIAC:  Increased heart rate but no murmurs or gallops.  ABDOMEN:  Soft with tenderness in the epigastric area.  Bowel sounds are  decreased.  No distention.  EXTREMITIES:  No obvious deformity  and no tender cords.  Homans sign is  negative.  Good capillary refill distally.  No edema.  NEUROLOGIC:  Alert and oriented x3.  Nonfocal exam.   IMPRESSION:  Failure to thrive in this pleasant 76 year old female following  recent abdominal surgery for serous endometrial carcinoma with apparent  lymph node involvement.  She is not able to tolerate oral PPIs because of  epigastric pain.  It appears as though she likely has starvation ketosis.   PLAN:  1. Will admit and hydrate.  2. Keep n.p.o. for now.  3. Recheck labs to include CBC, amylase, lipase, CMET and urinalysis.  4. Start IV Protonix.  5. Plan on GI consultation.  She could easily have gastritis versus stress      ulcer.  Does not appear to have a surgical abdomen currently.      Candyce Churn, M.D.  Electronically Signed     RNG/MEDQ  D:  09/03/2005  T:  09/03/2005  Job:  [id number]   cc:   De Blanch, M.D.  [address]  [place]  [place] [address]   Javier Glazier. Okey Dupre, M.D.   Telford Nab, R.N.  [address]  [place], [place] [address]

## 2010-05-23 NOTE — Discharge Summary (Signed)
**Note Deborah-Identified via Obfuscation** NAMEMARI, [name]               ACCOUNT NO.:  [id number]   MEDICAL RECORD NO.:  [id number]          PATIENT TYPE:  INP   LOCATION:  1424                         FACILITY:  [name]   PHYSICIAN:  [name], M.D.DATE OF BIRTH:  09-05-1934   DATE OF ADMISSION:  07/28/2005  DATE OF DISCHARGE:                                 DISCHARGE SUMMARY   PROBLEMS:  1.  Right chest pain with probable pulmonary embolus per pulmonary angiogram      involving Deborah right lower lung.  2.  Left popliteal deep venous thrombosis via venous Dopplers on July 28, 2005.  3.  History of deep venous thrombosis and pulmonary embolus in 1975, 32      years ago, while taking oral contraceptives.  4.  Serous adenocarcinoma of Deborah uterus, currently being evaluated by [name]      [name], Dr. Judd [name]. [name] this coming Tuesday on August 04, 2005.  5.  Anxiety/mild depression.  6.  Borderline elevation of blood pressure in Deborah past.  7.  Fibrocystic breast disease.  8.  Chronic insomnia.  9.  Episodic sinusitis with mucus seen on Deborah right maxillary sinus.  10. Gastroesophageal reflux disease.   PROCEDURES:  Placement of IVC filter, per interventional radiology on July 30, 2005.  Can be removed within four weeks.  Lovenox to be restarted on Deborah  a.m. of July 31, 2005.   DISCHARGE MEDICATIONS:  1.  Lovenox 80 mg q.12h. until surgery is scheduled for uterine carcinoma.  2.  Lexapro 5 mg daily.  3.  Toradol 10 mg p.o. t.i.d. p.r.n. chest pain, to take no longer than      three days.  4.  Simethicone 80 mg q.8h. p.r.n. belching.  5.  Ambien 10 mg at night p.r.n. insomnia.  6.  Xanax 0.25 mg p.o. q.6h. p.r.n. anxiety.   ALLERGIES:  Patient is intolerant to MORPHINE SULFATE, CODEINE, and  DARVOCET.   [name] COURSE:  [name] is a pleasant 76 year old female who  developed 24 hours of right chest pain somewhat spontaneously in onset and  was presented to [name] emergency  room complaining of right chest pain.  She underwent CT angio of Deborah chest revealing probable right lung pulmonary  embolus but Deborah finding was not definitive, and ultrasound of Deborah lower  extremities revealed a left popliteal DVT, suggesting probable pulmonary  embolus of Deborah right lower lobe.   Patient has started on Lovenox and chest pain.  Right chest pain was treated  with IV Toradol with good results.  She did experience some dyspepsia while  hospitalized, which responded to simethicone tablets and continued PPI  therapy.   Deborah patient had a D&C performed by Dr. Argentina [name] of [name]  earlier this month, which was evaluated secondary to vaginal bleeding, and  she was found to have a serous adenocarcinoma of Deborah uterus without apparent  extra uterine involvement.  She is to be evaluated by Dr. Katheren Shams-  Sharol [name] or Dr. Kyla [name] early next week and plans  are made for that visit.  Patient was not started on Coumadin therapy because of Deborah imminent need for  surgery, and an IVC filter was placed today via interventional radiology, as  above, so that patient can have anticoagulation reversed prior to surgery.  She will be on Lovenox, as above, until her anticipated surgery.   There is still Deborah slight possibility that Deborah right lung abnormality is not  a pulmonary embolus but is felt to likely be.  This should improve over Deborah  next couple of weeks and repeat CT of Deborah chest should be performed to note  improvement.   Laboratories on admission included a white count of 12,400, hemoglobin 14.2,  platelet count 216,000.  D-dimers on admission were elevated at 3.3.  Sodium  140, potassium 3.9, chloride 103, bicarb 30, BUN 10, creatinine 0.7, [name]  81.   Again, patient will be discharged home on Lovenox 80 mg q.12h., to be  administered by a family member trained in injections.      [name], M.D.  Electronically Signed     RNG/MEDQ  D:  07/30/2005   T:  07/30/2005  Job:  [id number]   cc:   [name], M.D.  [address]  [place]  [place] [address]   [name]. [name], M.D.  [address]  [place]  [place] [address]

## 2010-05-23 NOTE — Consult Note (Signed)
NAMEKIRSTA, [name]               ACCOUNT NO.:  [id number]   MEDICAL RECORD NO.:  [id number]          PATIENT TYPE:  OUT   LOCATION:  GYN                          FACILITY:  [facility]   PHYSICIAN:  Paola A. Duard Brady, MD    DATE OF BIRTH:  03-Jun-1934   DATE OF CONSULTATION:  08/04/2005  DATE OF DISCHARGE:                                   CONSULTATION   REFERRING PHYSICIAN:  Dr. Elinor Dodge and Dr. Marden Noble.  The patient is  seen today in consultation request of Dr. Kevan Ny.   [name] is a 76 year old gravida 5, para 4 who June 07, 2005, began having  some bleeding and underwent a fractional D&C by Dr. Okey Dupre.  Final pathology  was consistent with a serous endometrial carcinoma.  Since her D&C she has  really had no bleeding.  She really denies any significant abdominal or  pelvic pain.  Of note, she was admitted to the hospital on July 28, 2005.  At that time she complaining of right-sided chest pain and right-sided arm  pain.  She underwent a CT scan and angiogram of the chest that revealed a  probable right-sided pulmonary embolism.  Bilateral Dopplers revealed a left  popliteal DVT with a probable right lower lobe pulmonary embolism.  She  underwent an IVC filter placement on July 30, 2005, and was started on  Lovenox.  She was subsequently discharged from the hospital on July 26 on  Lovenox 80 mg subcu b.i.d. with her followup today.  She has overall been  doing fairly well.  The biggest issue with her has been has been some peptic  ulcer disease and anxiety which has decreased her ability to eat.  She has  also become quite sedentary.  Essentially, this is a woman who a month ago  was in her usual state of health, feeling quite well, and has decompensated  fairly quickly.   REVIEW OF SYSTEMS:  She denies any nausea or vomiting.  She does complain of  being significantly nervous.  She denies any further chest pain.  The pain  she had with her right arm discomfort is gone.  She denies  any radiation of  pain, any shortness of breath, any chest pressure, any sweating.  She denies  any unintentional weight gain.  She has had about 10 pounds of weight loss  which has not been unintentional, is not unexplained as she has not been  eating well.  She is tolerating her subcu Lovenox injections without any  difficulties.   PAST MEDICAL HISTORY:  Is significant for:  1.  Deep venous thrombosis and pulmonary embolism in 1975 while taking birth      control pills.  2.  Anxiety and mild depression.  3.  Borderline elevated blood pressures.  4.  Chronic insomnia.  5.  Gastroesophageal reflux disease.  6.  PE and DVT currently.  7.  Endometrial carcinoma.   MEDICATIONS:  1.  Lexapro 10 mg daily.  2.  Xanax 0.25 mg q.6h. p.r.n.  3.  Ambien 10 mg q.h.s. p.r.n.  4.  Protonix.  ALLERGIES:  Include MORPHINE, TYLENOL WITH CODEINE, and DARVOCET - all of  which cause nausea, vomiting.   PAST SURGICAL HISTORY:  1.  Right rotator cuff.  2.  Cholecystectomy.  3.  Left ankle surgery.  4.  D&C.   FAMILY HISTORY:  She has a brother who died of a brain tumor.  A brother  died of lung cancer - he was a smoker.  A sister died of stomach cancer.  A  sister who died of pancreatic cancer.  A sister who died of breast cancer.  There was no cancer in her parents or grandparents.  Her mother lived to be  a 39 years old.  She has four children who are alive and well.  She has  nine grandchildren.   HEALTH MAINTENANCE:  She is due for a mammogram.  She had a colonoscopy less  than 10 years ago which was negative.   SOCIAL HISTORY:  She denies use of tobacco or alcohol.  She is married.  She  is a retired Investment banker, corporate.   PHYSICAL EXAMINATION:  VITAL SIGNS:  Height 5 feet 6-and-a-half inches,  weight 165 pounds.  GENERAL:  Well-nourished, well-developed female in no acute distress.  NECK:  Supple.  There is no lymphadenopathy, no thyromegaly.  LUNGS:  Clear to  auscultation bilaterally.  CARDIOVASCULAR:  Regular rate and rhythm.  ABDOMEN:  Soft, nontender, nondistended.  There is no palpable mass or  hepatomegaly.  Groins are negative for adenopathy.  EXTREMITIES:  There is no edema of the right.  There is 1+ edema of the left  leg.  There is no palpable cord.  The leg is nontender.  PELVIC:  External genitalia is within normal limits.  The vagina is markedly  atrophic.  The cervix is visualized.  It is multiparous.  There is a  physiologic discharge with no visible lesions.  Bimanual examination:  She  has a retroverted uterus which is slightly globular, about 10 weeks size.  There are no adnexal masses.  Rectovaginal confirms.  The parametria are  free.   ASSESSMENT:  A 76 year old with a serous carcinoma of the endometrium who  appears to have had a left-sided deep venous thrombosis and a pulmonary  embolism.  She has had an IVC filter placed and is currently on Lovenox.   The plan is:  1.  She is scheduled for surgery for next Tuesday, August 11, 2005, with Dr.      Stanford Breed.  The patient and her family are aware that I will not be      performing her surgery but it will be done by Dr. Stanford Breed.  Risks      of surgery including bleeding of which she is at particular risk because      of her anticoagulation and she does accept blood transfusion, infection      are issues.  Progressive thromboembolic disease is a concern; therefore,      she will be anticoagulated postoperatively.  She understands that      depending on how her surgery is going she may or may not have complete      staging.  She does understand if complete staging is performed and she      has either significant depth of myometrial invasion or extrauterine      disease she will require some form of chemotherapy or radiation if      tolerated.  2.  She has had significant deconditioning in the past  month.  We have     encouraged her to do some more walking and  have increased p.o. intake.      The patient understands this as do her husband and son who are with her      today      and they will proceed with encouraging her to eat some more.  3.  She will come in later this week for a preop.  We will check a CA-125.      I do not believe there is an indication at this point to proceed with a      CT scan of the abdomen or pelvis.      Paola A. Duard Brady, MD  Electronically Signed     PAG/MEDQ  D:  08/04/2005  T:  08/04/2005  Job:  [id number]   cc:   Telford Nab, R.N.  [address]  [place], [place] [address]   Candyce Churn, M.D.  Fax: [phone]   Phil D. Okey Dupre, M.D.

## 2010-05-23 NOTE — Consult Note (Signed)
[name], [name]               ACCOUNT NO.:  [id number]   MEDICAL RECORD NO.:  [id number]          PATIENT TYPE:  OUT   LOCATION:  GYN                          FACILITY:  [facility]   PHYSICIAN:  De Blanch, M.D.DATE OF BIRTH:  [birth year]   DATE OF CONSULTATION:  03/24/2006  DATE OF DISCHARGE:                                 CONSULTATION   CHIEF COMPLAINT:  Endometrial cancer.   HISTORY:  The patient has now completed her pelvic radiation therapy  including external-beam radiation therapy and three intracavitary  brachytherapies, last administered __________  .  She tolerated the  radiation therapy quite well.  Currently, her chief complaint remains  that of peripheral neuropathy in her feet.  She reports her hands are  better, but that her feet still feel heavy has if they are blocks.  Her appetite is good.  Apparently, she became hypokalemic and has been  placed on potassium every other day, and she eats a banana as well.   HISTORY OF PRESENT ILLNESS:  The patient underwent total abdominal  hysterectomy and bilateral salpingo-oophorectomy and pelvic  lymphadenectomy August 2007.  She had extensive involvement of her  pelvic lymph nodes.  Because she had a vena cava filter in place, we did  not do an aortic lymphadenectomy, although she had no palpably enlarged  nodes.  She was then treated with a combination of carboplatin and Taxol  for three cycles followed by pelvic radiation therapy.  We are now  considering the possibility reinitiating three more cycles of  chemotherapy in a sandwich technique.   PAST MEDICAL HISTORY:  1. Anxiety and depression.  2. Deep vein thrombosis and pulmonary embolus.  3. Borderline hypertension.  4. Chronic insomnia.  5. GERD.  6. Rotator cuff repair.   CURRENT MEDICATIONS:  1. Lexapro.  2. Paxil.  3. Ativan.  4. Protonix.  5. Ambien.  6. Coumadin.   DRUG ALLERGIES:  MORPHINE, TYLENOL, CODEINE AND DARVOCET.   FAMILY HISTORY:   Negative for gynecologic, breast or colon cancer.   SOCIAL HISTORY:  The patient is married.  She comes accompanied by her  husband.  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   REVIEW OF SYSTEMS:  Ten-point coverage review of systems negative except  for symptoms noted above.   PHYSICAL EXAMINATION:  Weight 120 pounds, blood pressure 112/74, pulse  80, respiratory rate 20.  GENERAL:  The patient is a healthy, slender, African-American female in  no acute distress.  HEENT:  Is negative.  NECK:  Supple without thyromegaly.  There is no supraclavicular or  inguinal adenopathy.  ABDOMEN:  Soft, nontender.  No masses, organomegaly, ascites or hernias  are noted.  PELVIC EXAM:  EGBUS, vagina, bladder and urethra are normal.  Vaginal  cuff is well-healed.  No lesions noted.  Cervix and uterus surgically  absent.  Adnexa without masses.  Rectovaginal exam confirms.  LOWER EXTREMITIES:  Without edema or varicosities.   IMPRESSION:  1. 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  She has now completed  pelvic      radiation therapy and seems to have tolerated this quite well.  2. Peripheral neuropathy which seems to relatively severe in her feet.   I had a lengthy discussion with the patient and her husband regarding  management at this time.  It would be my desire to complete her planned  radiate chemotherapy.  However, given the fact that she is having  peripheral neuropathy, I think we must discontinue using Taxol.  I would  therefore suggest she resume chemotherapy using single-agent  carboplatin.  I have discussed this with Dr. Dalene Carrow, and Dr. Dalene Carrow will  arrange for the patient to return in early April to reinitiate  chemotherapy.  The patient is very reticent to resume chemotherapy given  her previous side effects, and we have agreed that she will take one  cycle and then reevaluate.  As long as she is  tolerating chemotherapy,  we will continue to complete three cycles.  Following completion of her  chemotherapy, I would like to see the patient back in approximately 1  month.      De Blanch, M.D.  Electronically Signed     DC/MEDQ  D:  03/24/2006  T:  03/24/2006  Job:  [id number]   cc:   Vicente Serene I. Odogwu, M.D.  Fax: [phone]   Telford Nab, R.N.  [address]  [place], [place] [address]   Candyce Churn, M.D.  Fax: [phone]   Phil D. Okey Dupre, M.D.

## 2010-05-23 NOTE — Op Note (Signed)
NAMEDEONDRIA, PURYEAR               ACCOUNT NO.:  [id number]   MEDICAL RECORD NO.:  [id number]          PATIENT TYPE:  INP   LOCATION:  1313                         FACILITY:  [facility]   PHYSICIAN:  Shirley Friar, MDDATE OF BIRTH:  18-Apr-1934   DATE OF PROCEDURE:  09/04/2005  DATE OF DISCHARGE:                                 OPERATIVE REPORT   INDICATIONS:  Heart per heartburn, odynophagia.   MEDICATIONS:  Fentanyl 45 mcg IV, Versed 4 mg IV.   FINDINGS:  Endoscope was inserted to the oropharynx and esophagus was  intubated which was normal in its entirety.  There were no ulcers, erosions,  or erythema in the esophagus.  Endoscope was easily advanced down to the  stomach which revealed no mucosal abnormalities including normal body,  antrum, and pyloric channel.  Retroflexion was done which revealed normal  angularis, cardia, and fundus.  Endoscope was straightened and advanced down  into the duodenal bulb; and into the second portion of duodenum.  The second  portion of the duodenum and duodenal bulb were both normal.  Endoscope was  then withdrawn to confirm the above findings.   ASSESSMENT:  Normal EGD.   PLAN:  1. Consider upper GI series and small bowel follow through as an      outpatient if her symptoms continue.  2. Continue Protonix but change to p.o.  3. Clear liquid diet and advance as tolerated.      Shirley Friar, MD  Electronically Signed     VCS/MEDQ  D:  09/04/2005  T:  09/05/2005  Job:  [id number]   cc:   Candyce Churn, M.D.  Fax: [phone]

## 2010-05-30 ENCOUNTER — Ambulatory Visit: Payer: Medicare Other | Attending: Gynecology | Admitting: Gynecology

## 2010-05-30 ENCOUNTER — Other Ambulatory Visit: Payer: Self-pay | Admitting: Gynecology

## 2010-05-30 ENCOUNTER — Other Ambulatory Visit (HOSPITAL_COMMUNITY)
Admission: RE | Admit: 2010-05-30 | Discharge: 2010-05-30 | Disposition: A | Discharge status: Home / Self Care | Payer: Medicare Other | Source: Ambulatory Visit | Attending: Gynecology | Admitting: Gynecology

## 2010-05-30 DIAGNOSIS — Z8540 Personal history of malignant neoplasm of unspecified female genital organ: Secondary | ICD-10-CM | POA: Insufficient documentation

## 2010-06-03 NOTE — Consult Note (Signed)
  [name], [name]               ACCOUNT NO.:  [id number]  MEDICAL RECORD NO.:  [id number]           PATIENT TYPE:  O  LOCATION:  GYN                          FACILITY:  [facility]  PHYSICIAN:  De Blanch, M.D.DATE OF BIRTH:  27-Aug-1934  DATE OF CONSULTATION:  05/30/2010 DATE OF DISCHARGE:                                CONSULTATION   CHIEF COMPLAINT:  Endometrial cancer, weight loss.  INTERVAL HISTORY:  The patient returns today as scheduled for followup. She was last seen in our clinic a year ago.  In the interval, she has seen Dr. Roselind Messier.  Over last 6 months, she has lost approximately 10 pounds (10% of her body weight).  She reports that, in fact, over the last month, she has actually gained 3 pounds back.  Her main problem is that she just does not have any appetite.  She denies any nausea or vomiting.  She has had upper endoscopy and colonoscopy during this past year which were essentially negative except for findings of some small benign colonic polyps.  The patient notes that she had used Megace for a while and this did help with her appetite, but she has not used it for several months.  It was noted that her husband has pancreatic cancer, but she is actually doing reasonably well.  The patient herself has diarrhea which is relatively constant.  HISTORY OF PRESENT ILLNESS:  Stage IIIC endometrial papillary serous carcinoma.  The patient underwent surgery in August 2007 including TAH- BSO, pelvic lymphadenectomy.  She had extensive lymphatic involvement and was treated with carboplatin, Taxol in a sandwich fashion including extended field radiation therapy.  Since then, she has been followed with no evidence of recurrent disease.  DRUG ALLERGIES: 1. MORPHINE. 2. TYLENOL. 3. CODEINE. 4. DARVOCET.  CURRENT MEDICATIONS:  Coumadin.  FAMILY HISTORY:  Negative for gynecologic, breast, or colon cancer.  REVIEW OF SYSTEMS:  A 10-point comprehensive review of  systems negative except as noted above.  PHYSICAL EXAMINATION:  VITAL SIGNS:  Weight 100 pounds, blood pressure 100/60. GENERAL:  The patient is a very slender, African American female, in no acute distress. HEENT:  Negative. NECK:  Supple without thyromegaly.  There is no supraclavicular or inguinal adenopathy. ABDOMEN:  Soft and nontender.  No masses, organomegaly, ascites, or hernias noted. PELVIC:  EGBUS, vagina, bladder, urethra are normal.  Cervix and uterus are surgically absent.  Adnexa without masses.  Rectovaginal exam confirms.  IMPRESSION:  Stage IIIC endometrial carcinoma in August 2007, clinically free of disease.  Past smears were obtained.  Anorexia with continued weight loss.  The patient is given a new prescription for Megace 10 cc p.o. every morning.  She will see Dr. Roselind Messier in 6 months and return to see [place] in 1 year.  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     De Blanch, M.D.  CC Telford Nab, RN    Antony Blackbird, MD    DC/MEDQ  D:  05/30/2010  T:  05/31/2010  Job:  [id number]  Electronically Signed by De Blanch M.D. on 06/03/2010 08:10:01 AM

## 2010-09-11 ENCOUNTER — Emergency Department (HOSPITAL_COMMUNITY)
Admission: EM | Admit: 2010-09-11 | Discharge: 2010-09-12 | Disposition: A | Discharge status: Other Acute Inpatient Hospital | Payer: Medicare Other | Attending: Emergency Medicine | Admitting: Emergency Medicine

## 2010-09-11 DIAGNOSIS — I10 Essential (primary) hypertension: Secondary | ICD-10-CM | POA: Insufficient documentation

## 2010-09-11 DIAGNOSIS — Z8673 Personal history of transient ischemic attack (TIA), and cerebral infarction without residual deficits: Secondary | ICD-10-CM | POA: Insufficient documentation

## 2010-09-11 DIAGNOSIS — F3289 Other specified depressive episodes: Secondary | ICD-10-CM | POA: Insufficient documentation

## 2010-09-11 DIAGNOSIS — Z7901 Long term (current) use of anticoagulants: Secondary | ICD-10-CM | POA: Insufficient documentation

## 2010-09-11 DIAGNOSIS — F329 Major depressive disorder, single episode, unspecified: Secondary | ICD-10-CM | POA: Insufficient documentation

## 2010-09-11 DIAGNOSIS — Z86718 Personal history of other venous thrombosis and embolism: Secondary | ICD-10-CM | POA: Insufficient documentation

## 2010-09-11 DIAGNOSIS — R630 Anorexia: Secondary | ICD-10-CM | POA: Insufficient documentation

## 2010-09-11 DIAGNOSIS — Z8544 Personal history of malignant neoplasm of other female genital organs: Secondary | ICD-10-CM | POA: Insufficient documentation

## 2010-09-11 LAB — COMPREHENSIVE METABOLIC PANEL
ALT: 11 U/L (ref 0–35)
AST: 23 U/L (ref 0–37)
Alkaline Phosphatase: 46 U/L (ref 39–117)
CO2: 27 mEq/L (ref 19–32)
Calcium: 10.4 mg/dL (ref 8.4–10.5)
GFR calc Af Amer: >60 mL/min (ref >60)
GFR calc non Af Amer: >60 mL/min (ref >60)
Glucose, Bld: 104 mg/dL — ABNORMAL HIGH (ref 70–99)
Potassium: 4.5 mEq/L (ref 3.5–5.1)
Sodium: 138 mEq/L (ref 135–145)

## 2010-09-11 LAB — DIFFERENTIAL
Basophils Absolute: 0 10*3/uL (ref 0.0–0.1)
Basophils Relative: 0 % (ref 0–1)
Eosinophils Relative: 0 % (ref 0–5)
Lymphocytes Relative: 19 % (ref 12–46)
Monocytes Absolute: 0.5 10*3/uL (ref 0.1–1.0)
Monocytes Relative: 10 % (ref 3–12)
Neutro Abs: 3.6 10*3/uL (ref 1.7–7.7)

## 2010-09-11 LAB — RAPID URINE DRUG SCREEN, HOSP PERFORMED: Barbiturates: NOT DETECTED

## 2010-09-11 LAB — CBC
MCH: 31.2 pg (ref 26.0–34.0)
MCHC: 34.1 g/dL (ref 30.0–36.0)
MCV: 91.4 fL (ref 78.0–100.0)
Platelets: 190 10*3/uL (ref 150–400)
RDW: 12.2 % (ref 11.5–15.5)
WBC: 5.1 10*3/uL (ref 4.0–10.5)

## 2010-09-12 ENCOUNTER — Emergency Department (HOSPITAL_COMMUNITY): Payer: Medicare Other

## 2010-09-12 LAB — URINALYSIS, ROUTINE W REFLEX MICROSCOPIC
Glucose, UA: NEGATIVE mg/dL
Nitrite: NEGATIVE
pH: 6 (ref 5.0–8.0)

## 2010-09-12 LAB — URINE MICROSCOPIC-ADD ON

## 2010-09-13 LAB — URINE CULTURE: Colony Count: 75000

## 2010-12-12 ENCOUNTER — Encounter: Payer: Self-pay | Admitting: *Deleted

## 2010-12-12 DIAGNOSIS — K529 Noninfective gastroenteritis and colitis, unspecified: Secondary | ICD-10-CM | POA: Insufficient documentation

## 2010-12-12 DIAGNOSIS — I2699 Other pulmonary embolism without acute cor pulmonale: Secondary | ICD-10-CM | POA: Insufficient documentation

## 2010-12-12 DIAGNOSIS — E042 Nontoxic multinodular goiter: Secondary | ICD-10-CM | POA: Insufficient documentation

## 2010-12-12 DIAGNOSIS — I82409 Acute embolism and thrombosis of unspecified deep veins of unspecified lower extremity: Secondary | ICD-10-CM | POA: Insufficient documentation

## 2010-12-12 DIAGNOSIS — G4700 Insomnia, unspecified: Secondary | ICD-10-CM | POA: Insufficient documentation

## 2010-12-12 DIAGNOSIS — R630 Anorexia: Secondary | ICD-10-CM | POA: Insufficient documentation

## 2010-12-12 NOTE — Progress Notes (Signed)
Follow up Endometrial ca stage IIIC serous carcunima Radiation Therapy  01/07/2006-2/6/208 external beam, Intracavity brachytherapy 02/17/06,02/24/06, 03/03/06 Chemotherapy= Carboplatin/Taxol  Married, 5 children, retired Runner, broadcasting/film/video Asst/Libarian,  Menarche 15,     Allergies:morphine,codeine sulfate,iodine,propoxyphene,tramadol,pepcid

## 2010-12-15 ENCOUNTER — Ambulatory Visit: Admission: RE | Admit: 2010-12-15 | Payer: Medicare Other | Source: Ambulatory Visit | Admitting: Radiation Oncology

## 2010-12-22 ENCOUNTER — Other Ambulatory Visit (HOSPITAL_COMMUNITY)
Admission: RE | Admit: 2010-12-22 | Discharge: 2010-12-22 | Disposition: A | Discharge status: Home / Self Care | Payer: Medicare Other | Source: Ambulatory Visit | Attending: Radiation Oncology | Admitting: Radiation Oncology

## 2010-12-22 ENCOUNTER — Encounter: Payer: Self-pay | Admitting: Radiation Oncology

## 2010-12-22 ENCOUNTER — Ambulatory Visit
Admission: RE | Admit: 2010-12-22 | Discharge: 2010-12-22 | Disposition: A | Discharge status: Home / Self Care | Payer: Medicare Other | Source: Ambulatory Visit | Attending: Radiation Oncology | Admitting: Radiation Oncology

## 2010-12-22 DIAGNOSIS — C549 Malignant neoplasm of corpus uteri, unspecified: Secondary | ICD-10-CM

## 2010-12-22 DIAGNOSIS — Z124 Encounter for screening for malignant neoplasm of cervix: Secondary | ICD-10-CM | POA: Insufficient documentation

## 2010-12-22 NOTE — Progress Notes (Signed)
Here for f/u endometrial ca,  Pt still has diarrhea every am stated,taked imodium prn, pt does have tremors b/l arms/hands, ,  No c/o pain, weight down to 97.5lb, no appetite,   1:33 PM

## 2010-12-22 NOTE — Progress Notes (Signed)
**Note Deborah-Identified via Obfuscation** CC:   [name], M.D. [name], M.D. [name], M.D.  DIAGNOSIS:  Stage III-C endometrial cancer.  INTERVAL SINCE RADIATION THERAPY:  Four years and 9 months.  NARRATIVE:  [name] comes in today for routine followup.  She continues to have a poor appetite.  She was seen by Dr. Stanford Breed approximately 6 months ago.  The patient was placed on Megace but did not seem to have much benefit from this and subsequently stopped after being on this medication approximately a month.  The patient also complains of "indigestion."  She is on Prilosec for this issue.  In addition, the patient does have some discomfort in the mid abdominal area.  The patient continues to have chronic problems with diarrhea. She, however, uses her Imodium very sparingly.  I have recommended that the patient increase this to help control this issue.  The patient denies any rectal bleeding, vaginal bleeding or urination difficulties.  PHYSICAL EXAMINATION:  Vital Signs:  The patient's temperature is 97.6; pulse is 89; blood pressure is 116/77.  Neck:  Examination of the neck and supraclavicular region reveals no evidence of adenopathy.  Axillae: The axillary areas are free of adenopathy.  Lungs:  Examination of the lungs reveals them to be clear.  Heart:  The heart has a regular rhythm and rate.  Abdomen:  Examination the abdomen reveals questionable mass in the mid to upper abdominal region.  The patient denies any pain with palpation in this area.  The inguinal areas are free of adenopathy. Pelvic:  On pelvic examination, the external genitalia are unremarkable. A speculum exam was performed.  There are some radiation changes noted in the proximal vagina.  On bimanual and rectovaginal examination, there are no pelvic masses appreciated.  General:  The patient's weight today is 97 pounds, which is down approximately 12 pounds since her weighing in December of last  year.  IMPRESSION AND PLAN:  Stage III-C endometrial cancer.  As above, the patient continues to have a poor appetite and has had significant weight loss.  I question whether the patient may have a mass in the upper abdominal area.  The patient does wish to pursue workup of this issue, and in light of this, the patient will proceed with CT scan of the chest, abdomen and pelvis.  I should mention that the patient has undergone a colonoscopy and endoscopy in the past several months showing a small benign colonic polyps but no other issues.  FOLLOWUP:  The patient will follow up with Dr. Loree Fee in approximately 6 months and return for followup in Radiation Oncology in 1 year.  At that point, the patient will be out over 5 years from her treatment.    ______________________________ [name], Ph.D., M.D. JDK/MEDQ  D:  12/22/2010  T:  12/22/2010  Job:  [id number]

## 2010-12-22 NOTE — Progress Notes (Signed)
Pap smear collected by dr. Lewis Moccasin speciemen to the lab, pt tolerated well, not using dilator stated pt 2:05 PM

## 2010-12-23 ENCOUNTER — Telehealth: Payer: Self-pay | Admitting: *Deleted

## 2010-12-25 ENCOUNTER — Telehealth: Payer: Self-pay | Admitting: *Deleted

## 2010-12-26 ENCOUNTER — Ambulatory Visit (HOSPITAL_COMMUNITY)
Admission: RE | Admit: 2010-12-26 | Discharge: 2010-12-26 | Disposition: A | Discharge status: Home / Self Care | Payer: Medicare Other | Source: Ambulatory Visit | Attending: Radiation Oncology | Admitting: Radiation Oncology

## 2010-12-26 DIAGNOSIS — K769 Liver disease, unspecified: Secondary | ICD-10-CM | POA: Insufficient documentation

## 2010-12-26 DIAGNOSIS — C549 Malignant neoplasm of corpus uteri, unspecified: Secondary | ICD-10-CM | POA: Insufficient documentation

## 2010-12-26 DIAGNOSIS — R109 Unspecified abdominal pain: Secondary | ICD-10-CM | POA: Insufficient documentation

## 2010-12-26 DIAGNOSIS — E041 Nontoxic single thyroid nodule: Secondary | ICD-10-CM | POA: Insufficient documentation

## 2010-12-26 DIAGNOSIS — R634 Abnormal weight loss: Secondary | ICD-10-CM | POA: Insufficient documentation

## 2010-12-29 ENCOUNTER — Telehealth: Payer: Self-pay | Admitting: *Deleted

## 2010-12-29 NOTE — Telephone Encounter (Signed)
called pt home, gave pt pap results 12/23/10,  no malignancy and result of ct chest/abd/pelvis 12/26/10, informed both good results, no malignancy in pap result and no evidence of tumor recurrence from ct scan,pt thanked MD and this RN for good news 10:26 AM

## 2011-02-27 NOTE — Telephone Encounter (Signed)
XXXX 

## 2011-02-27 NOTE — Telephone Encounter (Signed)
xxx

## 2011-06-03 ENCOUNTER — Ambulatory Visit: Payer: Medicare Other | Attending: Gynecology | Admitting: Gynecology

## 2011-06-03 ENCOUNTER — Encounter: Payer: Self-pay | Admitting: Gynecology

## 2011-06-03 VITALS — BP 110/60 | HR 84 | Temp 97.7°F | Resp 14 | Ht 67.0 in | Wt 105.0 lb

## 2011-06-03 DIAGNOSIS — F329 Major depressive disorder, single episode, unspecified: Secondary | ICD-10-CM | POA: Insufficient documentation

## 2011-06-03 DIAGNOSIS — Z800 Family history of malignant neoplasm of digestive organs: Secondary | ICD-10-CM | POA: Insufficient documentation

## 2011-06-03 DIAGNOSIS — Z9221 Personal history of antineoplastic chemotherapy: Secondary | ICD-10-CM | POA: Insufficient documentation

## 2011-06-03 DIAGNOSIS — Z888 Allergy status to other drugs, medicaments and biological substances status: Secondary | ICD-10-CM | POA: Insufficient documentation

## 2011-06-03 DIAGNOSIS — Z09 Encounter for follow-up examination after completed treatment for conditions other than malignant neoplasm: Secondary | ICD-10-CM | POA: Insufficient documentation

## 2011-06-03 DIAGNOSIS — Z923 Personal history of irradiation: Secondary | ICD-10-CM | POA: Insufficient documentation

## 2011-06-03 DIAGNOSIS — Z801 Family history of malignant neoplasm of trachea, bronchus and lung: Secondary | ICD-10-CM | POA: Insufficient documentation

## 2011-06-03 DIAGNOSIS — Z808 Family history of malignant neoplasm of other organs or systems: Secondary | ICD-10-CM | POA: Insufficient documentation

## 2011-06-03 DIAGNOSIS — Z8542 Personal history of malignant neoplasm of other parts of uterus: Secondary | ICD-10-CM | POA: Insufficient documentation

## 2011-06-03 DIAGNOSIS — E049 Nontoxic goiter, unspecified: Secondary | ICD-10-CM | POA: Insufficient documentation

## 2011-06-03 DIAGNOSIS — Z86718 Personal history of other venous thrombosis and embolism: Secondary | ICD-10-CM | POA: Insufficient documentation

## 2011-06-03 DIAGNOSIS — Z803 Family history of malignant neoplasm of breast: Secondary | ICD-10-CM | POA: Insufficient documentation

## 2011-06-03 DIAGNOSIS — Z90710 Acquired absence of both cervix and uterus: Secondary | ICD-10-CM | POA: Insufficient documentation

## 2011-06-03 DIAGNOSIS — Z885 Allergy status to narcotic agent status: Secondary | ICD-10-CM | POA: Insufficient documentation

## 2011-06-03 DIAGNOSIS — C541 Malignant neoplasm of endometrium: Secondary | ICD-10-CM

## 2011-06-03 DIAGNOSIS — F3289 Other specified depressive episodes: Secondary | ICD-10-CM | POA: Insufficient documentation

## 2011-06-03 NOTE — Progress Notes (Signed)
Consult Note: Gyn-Onc   [name] 77 y.o. female  Chief Complaint  Patient presents with  . Endometrial cancer    Follow up    Interval History: The patient returns today as previously scheduled for followup. Since her last visit she's done well. She's actually gained approximately 5 pounds which is remarkable. She denies any GI or GU symptoms. She does complain of being somewhat fatigued.  HPI: In August of 2007 the patient underwent quarter laparotomy total abdominal hysterectomy and pelvic lymphadenectomy for stage IIIC endometrial papillary serous carcinoma. She had extensive involvement of lymph nodes. Postoperatively she was treated with sandwich therapy using carboplatin and Taxol as well as extended field radiation therapy. Remarkably she's remained free of disease for the past 6 years.  Review of Systems:10 point review of systems is negative as noted above.   Vitals: Blood pressure 110/60, pulse 84, temperature 97.7 F (36.5 C), resp. rate 14, height 5\' 7"  (1.702 m), weight 105 lb (47.628 kg).  Physical Exam: General : The patient is a healthy woman in no acute distress.  HEENT: normocephalic, extraoccular movements normal; neck is supple without thyromegally  Lynphnodes: Supraclavicular and inguinal nodes not enlarged  Abdomen: Soft, non-tender, no ascites, no organomegally, no masses, no hernias  Pelvic:  EGBUS: Normal female  Vagina: Normal, no lesions  Urethra and Bladder: Normal, non-tender  Cervix: Surgically absent  Uterus: Surgically absent  Bi-manual examination: Non-tender; no adenxal masses or nodularity  Rectal: normal sphincter tone, no masses, no blood  Lower extremities: No edema or varicosities. Normal range of motion    Assessment/Plan: Stage III C. papillary serous carcinoma of the endometrium status post resection, chemotherapy, and radiation therapy. She is now 6 years survivor and doing very well.  She return to see me in one year for  continuing followup  Allergies  Allergen Reactions  . Aspridrox (Aspirin Buf(Alhyd-Mghyd-Cacar))   . Codeine   . Darvocet (Propoxyphene-Acetaminophen)   . Esomeprazole Magnesium   . Famotidine   . Iodine   . Morphine And Related   . Sulfa Antibiotics   . Sulfate   . Tramadol   . Tylenol (Acetaminophen)     Past Medical History  Diagnosis Date  . Endometrial ca   . Pulmonary embolus   . DVT (deep venous thrombosis)   . Chronic diarrhea   . Insomnia   . History of D&C   . Depression   . History of sinus surgery   . Multiple thyroid nodules 12/07    b/l nodules thyroid   . Anorexia     Past Surgical History  Procedure Date  . Cholecystectomy   . Rotator cuff repair     right  . Antroscopy     maxillary  . Ankle fracture repair     left ankle  . Ivc filter 2007    s/p DVT and P.E>  . Laparoscopic assisted radical vaginal hysterectomy w/ node biopsy 08/11/05  . Abdominal hysterectomy 08/11/05    tah,bso,pelvic lymphadenectomy  . Colonoscopy 2012    benign colonic polyps  . Esophagogastroduodenoscopy 2012    neg    Current Outpatient Prescriptions  Medication Sig Dispense Refill  . famotidine (PEPCID) 20 MG tablet Take 20 mg by mouth 2 (two) times daily.      Marland Kitchen loperamide (IMODIUM) 2 MG capsule Take 2 mg by mouth daily as needed.        Marland Kitchen LORazepam (ATIVAN) 1 MG tablet Take 1 mg by mouth as needed.        Marland Kitchen  mirtazapine (REMERON) 15 MG tablet Take 15 mg by mouth at bedtime.      Marland Kitchen OLANZapine (ZYPREXA) 2.5 MG tablet Take 5 mg by mouth at bedtime.       Marland Kitchen omeprazole (PRILOSEC) 20 MG capsule Take 20 mg by mouth daily.      Marland Kitchen warfarin (COUMADIN) 5 MG tablet Take 5 mg by mouth. 5mg  on Mon, Wed, and Fri.  7.5mg  the other days      . cholecalciferol (VITAMIN D) 1000 UNITS tablet Take 1,000 Units by mouth daily.        . diphenoxylate-atropine (LOMOTIL) 2.5-0.025 MG per tablet Take 1 tablet by mouth 4 (four) times daily as needed. 1-2 TABS  AFTER EACH LOOSE STOOL NOT TO  EXCEED 8/24HOURS       . lansoprazole (PREVACID) 30 MG capsule Take 30 mg by mouth daily. OTC       . Vitamin D, Ergocalciferol, (DRISDOL) 50000 UNITS CAPS Take 50,000 Units by mouth every 7 (seven) days.        Marland Kitchen DISCONTD: warfarin (COUMADIN) 4 MG tablet Take 4 mg by mouth daily. 8mg   Po Sundays and Mondays, 1.5tab rest of the days        History   Social History  . Marital Status: Married    Spouse Name: N/A    Number of Children: 5  . Years of Education: N/A   Occupational History  . RETIRED, Teacher Asst/Librarian    Social History Main Topics  . Smoking status: Never Smoker   . Smokeless tobacco: Not on file  . Alcohol Use: No  . Drug Use: No  . Sexually Active: Not on file   Other Topics Concern  . Not on file   Social History Narrative  . No narrative on file    Family History  Problem Relation Age of Onset  . Stomach cancer Sister   . Brain cancer Brother   . Lung cancer Brother   . Breast cancer Sister       Jeannette Corpus, MD 06/03/2011, 4:00 PM

## 2011-12-28 ENCOUNTER — Encounter: Payer: Self-pay | Admitting: Radiation Oncology

## 2011-12-28 ENCOUNTER — Other Ambulatory Visit (HOSPITAL_COMMUNITY)
Admission: RE | Admit: 2011-12-28 | Discharge: 2011-12-28 | Disposition: A | Discharge status: Home / Self Care | Payer: Medicare Other | Source: Ambulatory Visit | Attending: Radiation Oncology | Admitting: Radiation Oncology

## 2011-12-28 ENCOUNTER — Ambulatory Visit: Payer: Medicare Other | Admitting: Radiation Oncology

## 2011-12-28 ENCOUNTER — Ambulatory Visit
Admission: RE | Admit: 2011-12-28 | Discharge: 2011-12-28 | Disposition: A | Discharge status: Home / Self Care | Payer: Medicare Other | Source: Ambulatory Visit | Attending: Radiation Oncology | Admitting: Radiation Oncology

## 2011-12-28 VITALS — BP 131/77 | HR 87 | Temp 97.4°F | Resp 20 | Wt 102.3 lb

## 2011-12-28 DIAGNOSIS — C549 Malignant neoplasm of corpus uteri, unspecified: Secondary | ICD-10-CM

## 2011-12-28 DIAGNOSIS — Z124 Encounter for screening for malignant neoplasm of cervix: Secondary | ICD-10-CM | POA: Insufficient documentation

## 2011-12-28 DIAGNOSIS — Z1151 Encounter for screening for human papillomavirus (HPV): Secondary | ICD-10-CM | POA: Insufficient documentation

## 2011-12-28 HISTORY — DX: Reserved for concepts with insufficient information to code with codable children: IMO0002

## 2011-12-28 HISTORY — DX: Reserved for inherently not codable concepts without codable children: IMO0001

## 2011-12-28 NOTE — Progress Notes (Signed)
Patient here follow up rad txs stage III-C endometrial cancxer external beam 01/07/06-02/10/06, then Intracavity brachytherapy 02/17/06;,02/24/06; &03/03/06  Patient alert,oriented x3,, no dysuria,bleeding, does have peripheeral neuropathy in her feet from chemotherapy, does go  frequently  to void, has irregular bowels, now constipation, but usually had diarrhea every other day Her for pap  1:21 PM

## 2011-12-28 NOTE — Progress Notes (Signed)
Patient tolerated pap exam well, no c/o pain, Dr.Kinard releasing patient to follow up with GYN ONC MD  now, sent specimen to lab, patient to call if haven't heard results in 1 week, d/c home 1:53 PM

## 2011-12-28 NOTE — Progress Notes (Signed)
Radiation Oncology         [phone] ________________________________  Name: [name] MRN: [id number]  Date: 12/28/2011  DOB: 05-22-1934  Follow-Up Visit Note  CC: Pearla Dubonnet, MD  Ronita Hipps, MD  Diagnosis:   Stage IIIc endometrial cancer  Interval Since Last Radiation:  5 years and 9 months.  she completed external beam radiation therapy and intracavitary brachytherapy treatments.  Narrative:  The patient returns today for routine follow-up.  She denies any new medical problems. She continues to have discomfort in her feet related to her peripheral neuropathy.  she denies any hematuria vaginal bleeding or rectal bleeding. She denies any pain in the pelvis area.                              ALLERGIES:  is allergic to aspridrox; codeine; darvocet; esomeprazole magnesium; famotidine; iodine; morphine and related; sulfa antibiotics; sulfate; tramadol; and tylenol.  Meds: Current Outpatient Prescriptions  Medication Sig Dispense Refill  . cholecalciferol (VITAMIN D) 1000 UNITS tablet Take 1,000 Units by mouth daily.        . diphenoxylate-atropine (LOMOTIL) 2.5-0.025 MG per tablet Take 1 tablet by mouth 4 (four) times daily as needed. 1-2 TABS  AFTER EACH LOOSE STOOL NOT TO EXCEED 8/24HOURS       . famotidine (PEPCID) 20 MG tablet Take 20 mg by mouth daily.      Marland Kitchen loperamide (IMODIUM) 2 MG capsule Take 2 mg by mouth daily as needed.        Marland Kitchen LORazepam (ATIVAN) 1 MG tablet Take 1 mg by mouth as needed.        . mirtazapine (REMERON) 15 MG tablet Take 15 mg by mouth at bedtime.      Marland Kitchen OLANZapine (ZYPREXA) 2.5 MG tablet Take 5 mg by mouth at bedtime.       . Vitamin D, Ergocalciferol, (DRISDOL) 50000 UNITS CAPS Take 50,000 Units by mouth every 7 (seven) days.        Marland Kitchen warfarin (COUMADIN) 5 MG tablet Take 5 mg by mouth. 5mg  on Mon, Wed, and Fri.  7.5mg  the other days        Physical Findings: The patient is in no acute distress. Patient is alert and oriented.  weight  is 102 lb 4.8 oz (46.403 kg). Her oral temperature is 97.4 F (36.3 C). Her blood pressure is 131/77 and her pulse is 87. Her respiration is 20. Marland Kitchen  No palpable supraclavicular or axillary adenopathy. The lungs are clear to auscultation. The heart has regular rhythm and rate. The abdomen is soft and nontender with normal bowel sounds. The inguinal areas are free of adenopathy. On pelvic examination the external genitalia are unremarkable. A speculum exam is performed which shows radiation changes in the proximal vagina. There are no mucosal lesions noted.  A Pap smear was obtained of the proximal vagina.  On bimanual and rectovaginal examination there no pelvic masses appreciated.  Lab Findings: Lab Results  Component Value Date   WBC 5.1 09/11/2010   HGB 11.9* 09/11/2010   HCT 34.9* 09/11/2010   MCV 91.4 09/11/2010   PLT 190 09/11/2010    @LASTCHEM @  Radiographic Findings: No results found.  Impression:  No evidence of recurrence on clinical exam today, Pap smear pending.  Plan:  When necessary followup in radiation oncology. This will continue to followup in  gynecologic oncology on a yearly basis.  _____________________________________   Billie Lade,  PhD, MD

## 2012-02-17 ENCOUNTER — Telehealth: Payer: Self-pay | Admitting: Gynecologic Oncology

## 2012-02-17 NOTE — Telephone Encounter (Signed)
Patient informed of pap smear results from a pap performed by Dr. Roselind Messier on 12/28/11.  Informed of Dr. Nelwyn Salisbury recommendations for a repeat pap in one year.  She is to see Dr. Stanford Breed in March 2014. No concerns voiced.  Instructed to call the office for any needs.

## 2012-04-04 ENCOUNTER — Ambulatory Visit: Payer: Medicare Other | Admitting: Gynecology

## 2012-04-25 ENCOUNTER — Ambulatory Visit: Payer: Medicare Other | Attending: Gynecology | Admitting: Gynecology

## 2012-04-25 ENCOUNTER — Encounter: Payer: Self-pay | Admitting: Gynecology

## 2012-04-25 VITALS — BP 110/82 | HR 64 | Temp 97.9°F | Resp 18 | Ht 67.0 in | Wt 99.0 lb

## 2012-04-25 DIAGNOSIS — C549 Malignant neoplasm of corpus uteri, unspecified: Secondary | ICD-10-CM | POA: Insufficient documentation

## 2012-04-25 DIAGNOSIS — Z90710 Acquired absence of both cervix and uterus: Secondary | ICD-10-CM | POA: Insufficient documentation

## 2012-04-25 DIAGNOSIS — K5900 Constipation, unspecified: Secondary | ICD-10-CM | POA: Insufficient documentation

## 2012-04-25 DIAGNOSIS — Z9089 Acquired absence of other organs: Secondary | ICD-10-CM | POA: Insufficient documentation

## 2012-04-25 DIAGNOSIS — R634 Abnormal weight loss: Secondary | ICD-10-CM | POA: Insufficient documentation

## 2012-04-25 NOTE — Progress Notes (Signed)
Consult Note: Gyn-Onc   [name] 78 y.o. female  Chief Complaint  Patient presents with  . Endometrial Cancer    Follow up    Assessment : Stage IIIc endometrial cancer 2007. Clinically free of disease.  Significant weight loss of uncertain etiology  Constipation  Plan: Patient given a prescription for Megace 10 cc every morning to stimulate appetite. She will return to see me in one year.  Interval History: The patient returns today as previously scheduled for followup. Since her last visit she's done well except as had considerable weight loss. She has no explanation for this weight loss. Her only symptom is that of constipation. She denies any other GI or GU symptoms. She denies any pelvic pain pressure vaginal bleeding or discharge.  HPI:In August of 2007 the patient underwent quarter laparotomy total abdominal hysterectomy and pelvic lymphadenectomy for stage IIIC endometrial papillary serous carcinoma. She had extensive involvement of lymph nodes. Postoperatively she was treated with sandwich therapy using carboplatin and Taxol as well as extended field radiation therapy. Remarkably she's remained free of disease for the past 7 years.   Review of Systems:10 point review of systems is negative except as noted in interval history.   Vitals: Blood pressure 110/82, pulse 64, temperature 97.9 F (36.6 C), resp. rate 18, height 5\' 7"  (1.702 m), weight 99 lb (44.906 kg).  Physical Exam: General : The patient is a healthy woman in no acute distress she is very thin.Marland Kitchen  HEENT: normocephalic, extraoccular movements normal; neck is supple without thyromegally  Lynphnodes: Supraclavicular and inguinal nodes not enlarged  Abdomen: Soft, non-tender, no ascites, no organomegally, no masses, no hernias  Pelvic:  EGBUS: Normal female , atrophic Vagina: Normal, no lesions , atrophic and foreshortened Urethra and Bladder: Normal, non-tender  Cervix: Surgically absent  Uterus:  Surgically absent  Bi-manual examination: Non-tender; no adenxal masses or nodularity  Rectal: normal sphincter tone, no masses, no blood  Lower extremities: No edema or varicosities. Normal range of motion      Allergies  Allergen Reactions  . Aspridrox (Aspirin Buf(Alhyd-Mghyd-Cacar))   . Codeine   . Darvocet (Propoxyphene-Acetaminophen)   . Esomeprazole Magnesium   . Famotidine   . Iodine   . Morphine And Related   . Sulfa Antibiotics   . Sulfate   . Tramadol   . Tylenol (Acetaminophen)     Past Medical History  Diagnosis Date  . Endometrial ca   . Pulmonary embolus   . DVT (deep venous thrombosis)   . Chronic diarrhea   . Insomnia   . History of D&C   . Depression   . History of sinus surgery   . Multiple thyroid nodules 12/07    b/l nodules thyroid   . Anorexia   . Radiation Jan.3-Feb.6,2008    External Beam to pelvis  . Radiation Feb.13,20 and 27/2008    Intracavitary brachytherapy    Past Surgical History  Procedure Laterality Date  . Cholecystectomy    . Rotator cuff repair      right  . Antroscopy      maxillary  . Ankle fracture repair      left ankle  . Ivc filter  2007    s/p DVT and P.E>  . Laparoscopic assisted radical vaginal hysterectomy w/ node biopsy  08/11/05  . Abdominal hysterectomy  08/11/05    tah,bso,pelvic lymphadenectomy  . Colonoscopy  2012    benign colonic polyps  . Esophagogastroduodenoscopy  2012    neg  Current Outpatient Prescriptions  Medication Sig Dispense Refill  . cholecalciferol (VITAMIN D) 1000 UNITS tablet Take 1,000 Units by mouth daily.        . famotidine (PEPCID) 20 MG tablet Take 20 mg by mouth daily.      Marland Kitchen LORazepam (ATIVAN) 1 MG tablet Take 1 mg by mouth as needed.        . mirtazapine (REMERON) 15 MG tablet Take 15 mg by mouth at bedtime.      Marland Kitchen OLANZapine (ZYPREXA) 2.5 MG tablet Take 5 mg by mouth at bedtime.       . Omeprazole (PRILOSEC PO) Take by mouth daily.      Marland Kitchen warfarin (COUMADIN) 5 MG  tablet Take 5 mg by mouth. 5mg  on Mon, Wed, and Fri.  7.5mg  the other days      . diphenoxylate-atropine (LOMOTIL) 2.5-0.025 MG per tablet Take 1 tablet by mouth 4 (four) times daily as needed. 1-2 TABS  AFTER EACH LOOSE STOOL NOT TO EXCEED 8/24HOURS       . loperamide (IMODIUM) 2 MG capsule Take 2 mg by mouth daily as needed.        . Vitamin D, Ergocalciferol, (DRISDOL) 50000 UNITS CAPS Take 50,000 Units by mouth every 7 (seven) days.         No current facility-administered medications for this visit.    History   Social History  . Marital Status: Married    Spouse Name: N/A    Number of Children: 5  . Years of Education: N/A   Occupational History  . RETIRED, Teacher Asst/Librarian    Social History Main Topics  . Smoking status: Never Smoker   . Smokeless tobacco: Not on file  . Alcohol Use: No  . Drug Use: No  . Sexually Active: Not on file   Other Topics Concern  . Not on file   Social History Narrative  . No narrative on file    Family History  Problem Relation Age of Onset  . Stomach cancer Sister   . Brain cancer Brother   . Lung cancer Brother   . Breast cancer Sister       Jeannette Corpus, MD 04/25/2012, 3:13 PM

## 2012-04-25 NOTE — Patient Instructions (Signed)
Your given a prescription for Megace to be taking 10 mL every morning to stimulate appetite.  Return to see me in one year.

## 2012-07-29 ENCOUNTER — Other Ambulatory Visit: Payer: Self-pay | Admitting: Internal Medicine

## 2012-07-29 DIAGNOSIS — R1013 Epigastric pain: Secondary | ICD-10-CM

## 2012-08-01 ENCOUNTER — Ambulatory Visit
Admission: RE | Admit: 2012-08-01 | Discharge: 2012-08-01 | Disposition: A | Discharge status: Home / Self Care | Payer: Medicare Other | Source: Ambulatory Visit | Attending: Internal Medicine | Admitting: Internal Medicine

## 2012-08-01 ENCOUNTER — Other Ambulatory Visit: Payer: Self-pay | Admitting: Internal Medicine

## 2012-08-01 DIAGNOSIS — R1013 Epigastric pain: Secondary | ICD-10-CM

## 2012-12-13 ENCOUNTER — Encounter (HOSPITAL_COMMUNITY): Payer: Self-pay | Admitting: Emergency Medicine

## 2012-12-13 ENCOUNTER — Emergency Department (HOSPITAL_COMMUNITY)
Admission: EM | Admit: 2012-12-13 | Discharge: 2012-12-13 | Disposition: A | Discharge status: Home / Self Care | Payer: Medicare Other | Attending: Emergency Medicine | Admitting: Emergency Medicine

## 2012-12-13 ENCOUNTER — Emergency Department (HOSPITAL_COMMUNITY): Payer: Medicare Other

## 2012-12-13 DIAGNOSIS — S51809A Unspecified open wound of unspecified forearm, initial encounter: Secondary | ICD-10-CM | POA: Insufficient documentation

## 2012-12-13 DIAGNOSIS — IMO0002 Reserved for concepts with insufficient information to code with codable children: Secondary | ICD-10-CM | POA: Insufficient documentation

## 2012-12-13 DIAGNOSIS — F329 Major depressive disorder, single episode, unspecified: Secondary | ICD-10-CM | POA: Insufficient documentation

## 2012-12-13 DIAGNOSIS — F3289 Other specified depressive episodes: Secondary | ICD-10-CM | POA: Insufficient documentation

## 2012-12-13 DIAGNOSIS — Y929 Unspecified place or not applicable: Secondary | ICD-10-CM | POA: Insufficient documentation

## 2012-12-13 DIAGNOSIS — Z86711 Personal history of pulmonary embolism: Secondary | ICD-10-CM | POA: Insufficient documentation

## 2012-12-13 DIAGNOSIS — Z8542 Personal history of malignant neoplasm of other parts of uterus: Secondary | ICD-10-CM | POA: Insufficient documentation

## 2012-12-13 DIAGNOSIS — W1809XA Striking against other object with subsequent fall, initial encounter: Secondary | ICD-10-CM | POA: Insufficient documentation

## 2012-12-13 DIAGNOSIS — Z86718 Personal history of other venous thrombosis and embolism: Secondary | ICD-10-CM | POA: Insufficient documentation

## 2012-12-13 DIAGNOSIS — S0990XA Unspecified injury of head, initial encounter: Secondary | ICD-10-CM | POA: Insufficient documentation

## 2012-12-13 DIAGNOSIS — N390 Urinary tract infection, site not specified: Secondary | ICD-10-CM | POA: Insufficient documentation

## 2012-12-13 DIAGNOSIS — Z8639 Personal history of other endocrine, nutritional and metabolic disease: Secondary | ICD-10-CM | POA: Insufficient documentation

## 2012-12-13 DIAGNOSIS — Z79899 Other long term (current) drug therapy: Secondary | ICD-10-CM | POA: Insufficient documentation

## 2012-12-13 DIAGNOSIS — Z862 Personal history of diseases of the blood and blood-forming organs and certain disorders involving the immune mechanism: Secondary | ICD-10-CM | POA: Insufficient documentation

## 2012-12-13 DIAGNOSIS — Z7901 Long term (current) use of anticoagulants: Secondary | ICD-10-CM | POA: Insufficient documentation

## 2012-12-13 DIAGNOSIS — G4700 Insomnia, unspecified: Secondary | ICD-10-CM | POA: Insufficient documentation

## 2012-12-13 DIAGNOSIS — Z923 Personal history of irradiation: Secondary | ICD-10-CM | POA: Insufficient documentation

## 2012-12-13 DIAGNOSIS — S0180XA Unspecified open wound of other part of head, initial encounter: Secondary | ICD-10-CM | POA: Insufficient documentation

## 2012-12-13 DIAGNOSIS — Z23 Encounter for immunization: Secondary | ICD-10-CM | POA: Insufficient documentation

## 2012-12-13 DIAGNOSIS — W19XXXA Unspecified fall, initial encounter: Secondary | ICD-10-CM

## 2012-12-13 DIAGNOSIS — Y9389 Activity, other specified: Secondary | ICD-10-CM | POA: Insufficient documentation

## 2012-12-13 LAB — URINALYSIS, ROUTINE W REFLEX MICROSCOPIC
Glucose, UA: NEGATIVE mg/dL
Ketones, ur: NEGATIVE mg/dL
Nitrite: NEGATIVE
Specific Gravity, Urine: 1.014 (ref 1.005–1.030)
Urobilinogen, UA: 0.2 mg/dL (ref 0.0–1.0)
pH: 7.5 (ref 5.0–8.0)

## 2012-12-13 LAB — BASIC METABOLIC PANEL
CO2: 27 mEq/L (ref 19–32)
Chloride: 106 mEq/L (ref 96–112)
Glucose, Bld: 79 mg/dL (ref 70–99)
Potassium: 3.4 mEq/L — ABNORMAL LOW (ref 3.5–5.1)
Sodium: 140 mEq/L (ref 135–145)

## 2012-12-13 LAB — URINE MICROSCOPIC-ADD ON

## 2012-12-13 LAB — CBC
Hemoglobin: 10.8 g/dL — ABNORMAL LOW (ref 12.0–15.0)
RBC: 3.48 MIL/uL — ABNORMAL LOW (ref 3.87–5.11)
WBC: 3.7 10*3/uL — ABNORMAL LOW (ref 4.0–10.5)

## 2012-12-13 LAB — PROTIME-INR: INR: 2.12 — ABNORMAL HIGH (ref 0.00–1.49)

## 2012-12-13 MED ORDER — POTASSIUM CHLORIDE 20 MEQ/15ML (10%) PO LIQD
40.0000 meq | Freq: Once | ORAL | Status: AC
  Administered 2012-12-13: 40 meq via ORAL
  Filled 2012-12-13: qty 30

## 2012-12-13 MED ORDER — TETANUS-DIPHTHERIA TOXOIDS TD 5-2 LFU IM INJ
0.5000 mL | INJECTION | Freq: Once | INTRAMUSCULAR | Status: AC
  Administered 2012-12-13: 0.5 mL via INTRAMUSCULAR
  Filled 2012-12-13: qty 0.5

## 2012-12-13 MED ORDER — NITROFURANTOIN MONOHYD MACRO 100 MG PO CAPS: 100.0000 mg | ORAL_CAPSULE | Freq: Two times a day (BID) | ORAL | Status: DC

## 2012-12-13 NOTE — ED Notes (Signed)
Pt family will return in 45 minutes to transport pt.

## 2012-12-13 NOTE — ED Notes (Signed)
Bed: WA20 Expected date: 12/13/12 Expected time: 2:51 AM Means of arrival: Ambulance Comments: 78 yo F  Fall

## 2012-12-13 NOTE — ED Notes (Addendum)
Pt got up to use the bathroom and tripped hit head on dresser no loc. Pt has bruising to left wrist, skin tears to left forearm and mid forehead. Pt is alert and oriented x3. Pt on coumadin.

## 2012-12-13 NOTE — ED Provider Notes (Signed)
TIME SEEN: 3:27 AM  CHIEF COMPLAINT: Fall  HPI: Patient is a 78 year-old female with a prior history of endometrial cancer, pulmonary embolus and DVT on Coumadin who presents the emergency department after a fall and head injury tonight. Patient reports that she was getting out of bed to the bathroom and felt very off balance and struck her head on the nightstand. There was no loss of consciousness. She states she she has been feeling "off balance" for the past month. She has not seen her primary care physician for this. She denies any chest pain, shortness of breath, palpitations, dizziness, numbness or focal weakness. No recent fever, cough, vomiting or diarrhea.  ROS: See HPI Constitutional: no fever  Eyes: no drainage  ENT: no runny nose   Cardiovascular:  no chest pain  Resp: no SOB  GI: no vomiting GU: no dysuria Integumentary: no rash  Allergy: no hives  Musculoskeletal: no leg swelling  Neurological: no slurred speech ROS otherwise negative  PAST MEDICAL HISTORY/PAST SURGICAL HISTORY:  Past Medical History  Diagnosis Date  . Endometrial ca   . Pulmonary embolus   . DVT (deep venous thrombosis)   . Chronic diarrhea   . Insomnia   . History of D&C   . Depression   . History of sinus surgery   . Multiple thyroid nodules 12/07    b/l nodules thyroid   . Anorexia   . Radiation Jan.3-Feb.6,2008    External Beam to pelvis  . Radiation Feb.13,20 and 27/2008    Intracavitary brachytherapy    MEDICATIONS:  Prior to Admission medications   Medication Sig Start Date End Date Taking? Authorizing Provider  cholecalciferol (VITAMIN D) 1000 UNITS tablet Take 1,000 Units by mouth daily.      Historical Provider, MD  diphenoxylate-atropine (LOMOTIL) 2.5-0.025 MG per tablet Take 1 tablet by mouth 4 (four) times daily as needed. 1-2 TABS  AFTER EACH LOOSE STOOL NOT TO EXCEED 8/24HOURS     Historical Provider, MD  famotidine (PEPCID) 20 MG tablet Take 20 mg by mouth daily.     Historical Provider, MD  loperamide (IMODIUM) 2 MG capsule Take 2 mg by mouth daily as needed.      Historical Provider, MD  LORazepam (ATIVAN) 1 MG tablet Take 1 mg by mouth as needed.      Historical Provider, MD  mirtazapine (REMERON) 15 MG tablet Take 15 mg by mouth at bedtime.    Historical Provider, MD  OLANZapine (ZYPREXA) 2.5 MG tablet Take 5 mg by mouth at bedtime.     Historical Provider, MD  Omeprazole (PRILOSEC PO) Take by mouth daily.    Historical Provider, MD  Vitamin D, Ergocalciferol, (DRISDOL) 50000 UNITS CAPS Take 50,000 Units by mouth every 7 (seven) days.      Historical Provider, MD  warfarin (COUMADIN) 5 MG tablet Take 5 mg by mouth. 5mg  on Mon, Wed, and Fri.  7.5mg  the other days    Historical Provider, MD    ALLERGIES:  Allergies  Allergen Reactions  . Aspridrox [Aspirin Buf(Alhyd-Mghyd-Cacar)]   . Codeine   . Darvocet [Propoxyphene-Acetaminophen]   . Esomeprazole Magnesium   . Famotidine   . Iodine   . Morphine And Related   . Sulfa Antibiotics   . Sulfate   . Tramadol   . Tylenol [Acetaminophen]     SOCIAL HISTORY:  History  Substance Use Topics  . Smoking status: Never Smoker   . Smokeless tobacco: Not on file  . Alcohol Use: No  FAMILY HISTORY: Family History  Problem Relation Age of Onset  . Stomach cancer Sister   . Brain cancer Brother   . Lung cancer Brother   . Breast cancer Sister     EXAM: BP 127/66  Pulse 61  Temp(Src) 97.4 F (36.3 C) (Oral)  Resp 18  SpO2 100% CONSTITUTIONAL: Alert and oriented x3 and responds appropriately to questions. Well-appearing; well-nourished; GCS 15, elderly and cachectic HEAD: Normocephalic; 2 superficial skin tears to her forehead that are hemostatic EYES: Conjunctivae clear, PERRL, EOMI ENT: normal nose; no rhinorrhea; moist mucous membranes; pharynx without lesions noted; no dental injury;  no septal hematoma NECK: Supple, no meningismus, no LAD; no midline spinal tenderness, step-off or  deformity CARD: RRR; S1 and S2 appreciated; no murmurs, no clicks, no rubs, no gallops RESP: Normal chest excursion without splinting or tachypnea; breath sounds clear and equal bilaterally; no wheezes, no rhonchi, no rales; chest wall stable, nontender to palpation ABD/GI: Normal bowel sounds; non-distended; soft, non-tender, no rebound, no guarding PELVIS:  stable, nontender to palpation BACK:  The back appears normal and is non-tender to palpation, there is no CVA tenderness; no midline spinal tenderness, step-off or deformity EXT: Normal ROM in all joints; non-tender to palpation; no edema; normal capillary refill; no cyanosis    SKIN: Normal color for age and race; warm; patient has a 3 cm and 1 cm skin tear to her forehead, 1 cm skin tear to her left dorsal forearm, small abrasions to her right third finger over the palmar aspect NEURO: Moves all extremities equally, cranial nerves II through XII intact, no pronator drift, sensation to light touch intact diffusely PSYCH: The patient's mood and manner are appropriate. Grooming and personal hygiene are appropriate.  MEDICAL DECISION MAKING: Patient here with fall. Patient has multiple skin tears but no need for sutures. Will obtain CT head and cervical spine. Will also obtain labs and urine to evaluate for possible causes for patient's feeling off balance for the past month. Will update patient's tetanus. Patient denies wanting any pain medication.  ED PROGRESS: Patient's labs are unremarkable. Her INR is 2.12. She does appear to have a urinary tract infection with large leukocytes and many bacteria. Urine culture is pending. Head and cervical spine CTs show no acute injury. We'll discharge home with head injury return precautions. Have discussed with patient and daughter risks of delayed head bleed and signs and symptoms to watch out for. We'll also discharge with prescription for antibiotics for UTI. Patient and daughter verbalize understanding  and are comfortable with plan.   Elnita Maxwell - daughter - [phone] [name] - daughter - [phone]  Layla Maw Ward, DO 12/13/12 [phone]

## 2012-12-15 LAB — URINE CULTURE: Colony Count: >=100000

## 2012-12-16 ENCOUNTER — Telehealth (HOSPITAL_COMMUNITY): Payer: Self-pay | Admitting: Emergency Medicine

## 2012-12-16 NOTE — ED Notes (Signed)
Post ED Visit - Positive Culture Follow-up  Culture report reviewed by antimicrobial stewardship pharmacist: []  Wes Dulaney, Pharm.D., BCPS [x]  Celedonio Miyamoto, Pharm.D., BCPS []  Georgina Pillion, Pharm.D., BCPS []  [place], [place].D., BCPS, AAHIVP []  Estella Husk, Pharm.D., BCPS, AAHIVP  Positive urine culture Treated with Macrobid, organism sensitive to the same and no further patient follow-up is required at this time.  Kylie A Holland 12/16/2012, 11:18 AM

## 2012-12-31 ENCOUNTER — Emergency Department (HOSPITAL_COMMUNITY): Payer: Medicare Other

## 2012-12-31 ENCOUNTER — Encounter (HOSPITAL_COMMUNITY): Payer: Self-pay | Admitting: Emergency Medicine

## 2012-12-31 ENCOUNTER — Emergency Department (HOSPITAL_COMMUNITY)
Admission: EM | Admit: 2012-12-31 | Discharge: 2012-12-31 | Disposition: A | Discharge status: Home / Self Care | Payer: Medicare Other | Attending: Emergency Medicine | Admitting: Emergency Medicine

## 2012-12-31 DIAGNOSIS — F3289 Other specified depressive episodes: Secondary | ICD-10-CM | POA: Insufficient documentation

## 2012-12-31 DIAGNOSIS — Y92009 Unspecified place in unspecified non-institutional (private) residence as the place of occurrence of the external cause: Secondary | ICD-10-CM | POA: Insufficient documentation

## 2012-12-31 DIAGNOSIS — T148XXA Other injury of unspecified body region, initial encounter: Secondary | ICD-10-CM

## 2012-12-31 DIAGNOSIS — Z885 Allergy status to narcotic agent status: Secondary | ICD-10-CM | POA: Insufficient documentation

## 2012-12-31 DIAGNOSIS — Z9889 Other specified postprocedural states: Secondary | ICD-10-CM | POA: Insufficient documentation

## 2012-12-31 DIAGNOSIS — Z882 Allergy status to sulfonamides status: Secondary | ICD-10-CM | POA: Insufficient documentation

## 2012-12-31 DIAGNOSIS — M6281 Muscle weakness (generalized): Secondary | ICD-10-CM | POA: Insufficient documentation

## 2012-12-31 DIAGNOSIS — W1811XA Fall from or off toilet without subsequent striking against object, initial encounter: Secondary | ICD-10-CM | POA: Insufficient documentation

## 2012-12-31 DIAGNOSIS — IMO0002 Reserved for concepts with insufficient information to code with codable children: Secondary | ICD-10-CM | POA: Insufficient documentation

## 2012-12-31 DIAGNOSIS — Z79899 Other long term (current) drug therapy: Secondary | ICD-10-CM | POA: Insufficient documentation

## 2012-12-31 DIAGNOSIS — Z888 Allergy status to other drugs, medicaments and biological substances status: Secondary | ICD-10-CM | POA: Insufficient documentation

## 2012-12-31 DIAGNOSIS — E042 Nontoxic multinodular goiter: Secondary | ICD-10-CM | POA: Insufficient documentation

## 2012-12-31 DIAGNOSIS — Z8544 Personal history of malignant neoplasm of other female genital organs: Secondary | ICD-10-CM | POA: Insufficient documentation

## 2012-12-31 DIAGNOSIS — Y9389 Activity, other specified: Secondary | ICD-10-CM | POA: Insufficient documentation

## 2012-12-31 DIAGNOSIS — R269 Unspecified abnormalities of gait and mobility: Secondary | ICD-10-CM | POA: Insufficient documentation

## 2012-12-31 DIAGNOSIS — G4700 Insomnia, unspecified: Secondary | ICD-10-CM | POA: Insufficient documentation

## 2012-12-31 DIAGNOSIS — Z86711 Personal history of pulmonary embolism: Secondary | ICD-10-CM | POA: Insufficient documentation

## 2012-12-31 DIAGNOSIS — Z86718 Personal history of other venous thrombosis and embolism: Secondary | ICD-10-CM | POA: Insufficient documentation

## 2012-12-31 DIAGNOSIS — F329 Major depressive disorder, single episode, unspecified: Secondary | ICD-10-CM | POA: Insufficient documentation

## 2012-12-31 DIAGNOSIS — S0003XA Contusion of scalp, initial encounter: Secondary | ICD-10-CM | POA: Insufficient documentation

## 2012-12-31 DIAGNOSIS — Z7901 Long term (current) use of anticoagulants: Secondary | ICD-10-CM | POA: Insufficient documentation

## 2012-12-31 NOTE — ED Notes (Signed)
Patient transported to CT 

## 2012-12-31 NOTE — ED Provider Notes (Signed)
CSN: [id number]     Arrival date & time 12/31/12  0155 History   First MD Initiated Contact with Patient 12/31/12 0542     Chief Complaint  Patient presents with  . Fall   (Consider location/radiation/quality/duration/timing/severity/associated sxs/prior Treatment) HPI This patient is a very pleasant 78 year old woman who lives with her son. She was using her bedside commode when she lost her balance, fell and struck her head. She does not know if she lost consciousness. Her son found her next to the bed alert, oriented and appropriate. The patient is anticoagulated with Coumadin for history of DVT despite the fact that she ambulates with a cane and has some baseline gait instability.  The patient denies any significant pain at this time. She denies experiencing chest pain or shortness of breath leading up to this event. Tetanus is up-to-date.  Past Medical History  Diagnosis Date  . Endometrial ca   . Pulmonary embolus   . DVT (deep venous thrombosis)   . Chronic diarrhea   . Insomnia   . History of D&C   . Depression   . History of sinus surgery   . Multiple thyroid nodules 12/07    b/l nodules thyroid   . Anorexia   . Radiation Jan.3-Feb.6,2008    External Beam to pelvis  . Radiation Feb.13,20 and 27/2008    Intracavitary brachytherapy   Past Surgical History  Procedure Laterality Date  . Cholecystectomy    . Rotator cuff repair      right  . Antroscopy      maxillary  . Ankle fracture repair      left ankle  . Ivc filter  2007    s/p DVT and P.E>  . Laparoscopic assisted radical vaginal hysterectomy w/ node biopsy  08/11/05  . Abdominal hysterectomy  08/11/05    tah,bso,pelvic lymphadenectomy  . Colonoscopy  2012    benign colonic polyps  . Esophagogastroduodenoscopy  2012    neg   Family History  Problem Relation Age of Onset  . Stomach cancer Sister   . Brain cancer Brother   . Lung cancer Brother   . Breast cancer Sister    History  Substance Use Topics   . Smoking status: Never Smoker   . Smokeless tobacco: Not on file  . Alcohol Use: No   OB History   Grav Para Term Preterm Abortions TAB SAB Ect Mult Living   5 5             Obstetric Comments   Menarche 15,1st pregnancy 25,     Review of Systems Ten point review of symptoms performed and is negative with the exception of symptoms noted above. Chronic anorexia, chronic and generalized weakness.  Allergies  Aspridrox; Codeine; Darvocet; Esomeprazole magnesium; Iodine; Morphine and related; Sulfa antibiotics; Sulfate; Tramadol; and Tylenol  Home Medications   Current Outpatient Rx  Name  Route  Sig  Dispense  Refill  . famotidine (PEPCID) 20 MG tablet   Oral   Take 20 mg by mouth daily.         Marland Kitchen loperamide (IMODIUM) 2 MG capsule   Oral   Take 2 mg by mouth as needed for diarrhea or loose stools.         Marland Kitchen LORazepam (ATIVAN) 1 MG tablet   Oral   Take 1 mg by mouth as needed.           . mirtazapine (REMERON) 15 MG tablet   Oral   Take 15  mg by mouth at bedtime.         Marland Kitchen OLANZapine (ZYPREXA) 5 MG tablet   Oral   Take 5 mg by mouth at bedtime.         Marland Kitchen warfarin (COUMADIN) 5 MG tablet   Oral   Take 2.5-5 mg by mouth daily. Take 1 tablet daily except for 1/2 tablet on Sunday         . Omeprazole (PRILOSEC PO)   Oral   Take by mouth daily.         . temazepam (RESTORIL) 30 MG capsule   Oral   Take 30 mg by mouth at bedtime as needed for sleep (insomnia).          BP 141/71  Pulse 67  Temp(Src) 97.7 F (36.5 C) (Oral)  Resp 21  Ht 5\' 7"  (1.702 m)  Wt 99 lb (44.906 kg)  BMI 15.50 kg/m2  SpO2 99% Physical Exam Gen: well developed and ANOREXIC appearing Head: 2CM X 3CM HEMATOMA RIGHT FOREHEAD WITH SUPERFICIAL ABRASION. Wasting of the temporal musculature is appreciated.  Eyes: PERL, EOMI Nose: no epistaixis or rhinorrhea Mouth/throat: mucosa is moist and pink Neck: supple, no stridor, no c spine ttp Lungs: CTA B, no wheezing, rhonchi or  rales CV: RRR, no murmur Abd: soft, notender, nondistended Back: kyphosis,  no ttp, no cva ttp Skin: warm and dry Ext: no deformities, no ttp, FROM without pain at all joints of all extremities Neuro: CN ii-xii grossly intact, no focal deficits Psyche; normal affect,  calm and cooperative.   ED Course  Procedures (including critical care time) Labs Review  Recent Results (from the past 2160 hour(s))  CBC     Status: Abnormal   Collection Time    12/13/12  4:29 AM      Result Value Range   WBC 3.7 (*) 4.0 - 10.5 K/uL   RBC 3.48 (*) 3.87 - 5.11 MIL/uL   Hemoglobin 10.8 (*) 12.0 - 15.0 g/dL   HCT 09.8 (*) 11.9 - 14.7 %   MCV 91.7  78.0 - 100.0 fL   MCH 31.0  26.0 - 34.0 pg   MCHC 33.9  30.0 - 36.0 g/dL   RDW 82.9  56.2 - 13.0 %   Platelets 202  150 - 400 K/uL  BASIC METABOLIC PANEL     Status: Abnormal   Collection Time    12/13/12  4:29 AM      Result Value Range   Sodium 140  135 - 145 mEq/L   Potassium 3.4 (*) 3.5 - 5.1 mEq/L   Chloride 106  96 - 112 mEq/L   CO2 27  19 - 32 mEq/L   Glucose, Bld 79  70 - 99 mg/dL   BUN 19  6 - 23 mg/dL   Creatinine, Ser 8.65  0.50 - 1.10 mg/dL   Calcium 9.7  8.4 - 78.4 mg/dL   GFR calc non Af Amer 55 (*) >90 mL/min   GFR calc Af Amer 63 (*) >90 mL/min   Comment: (NOTE)     The eGFR has been calculated using the CKD EPI equation.     This calculation has not been validated in all clinical situations.     eGFR's persistently <90 mL/min signify possible Chronic Kidney     Disease.  PROTIME-INR     Status: Abnormal   Collection Time    12/13/12  4:29 AM      Result Value Range   Prothrombin Time 23.1 (*)  11.6 - 15.2 seconds   INR 2.12 (*) 0.00 - 1.49  URINALYSIS, ROUTINE W REFLEX MICROSCOPIC     Status: Abnormal   Collection Time    12/13/12  4:39 AM      Result Value Range   Color, Urine YELLOW  YELLOW   APPearance CLOUDY (*) CLEAR   Specific Gravity, Urine 1.014  1.005 - 1.030   pH 7.5  5.0 - 8.0   Glucose, UA NEGATIVE   NEGATIVE mg/dL   Hgb urine dipstick NEGATIVE  NEGATIVE   Bilirubin Urine NEGATIVE  NEGATIVE   Ketones, ur NEGATIVE  NEGATIVE mg/dL   Protein, ur NEGATIVE  NEGATIVE mg/dL   Urobilinogen, UA 0.2  0.0 - 1.0 mg/dL   Nitrite NEGATIVE  NEGATIVE   Leukocytes, UA LARGE (*) NEGATIVE  URINE CULTURE     Status: None   Collection Time    12/13/12  4:39 AM      Result Value Range   Specimen Description URINE, CLEAN CATCH     Special Requests NONE     Culture  Setup Time       Value: 12/13/2012 10:04     Performed at [facility] Count       Value: >=100,000 COLONIES/ML     Performed at [facility]   Culture       Value: ESCHERICHIA COLI     Performed at [facility]   Report Status 12/15/2012 FINAL     Organism ID, Bacteria ESCHERICHIA COLI    URINE MICROSCOPIC-ADD ON     Status: Abnormal   Collection Time    12/13/12  4:39 AM      Result Value Range   Squamous Epithelial / LPF FEW (*) RARE   WBC, UA 7-10  <3 WBC/hpf   Bacteria, UA MANY (*) RARE   CT head: NAICP.    MDM  Patient is s/p mechanical fall with forehead hematoma, on coumadin. We will obtain CT brain to rule out ICH as well as CXR and pelvis films.   ED work up is non-diagnostic. Patient is stable for d/c.     Brandt Loosen, MD 01/12/13 [phone]

## 2012-12-31 NOTE — ED Notes (Signed)
Patient presents to ED via [facility]. Patient "rolled out of bed" hitting her head on a "nightstand." Patient denies any LOC. GCS of 15 and patient was up and sitting on bed when EMS arrived on scene. Denies a "headache" but c/o of "discomfort" to left side of forehead. Hematoma to left forehead, bruising to left eyelid, laceration to right eyelid. Patient takes coumadin daily. No bleeding noted at this time. A&Ox4 upon arrival to ED.

## 2013-04-03 ENCOUNTER — Encounter: Payer: Self-pay | Admitting: Gastroenterology

## 2013-04-13 ENCOUNTER — Other Ambulatory Visit: Payer: Self-pay | Admitting: Family Medicine

## 2013-04-13 DIAGNOSIS — M8580 Other specified disorders of bone density and structure, unspecified site: Secondary | ICD-10-CM

## 2013-04-19 ENCOUNTER — Ambulatory Visit
Admission: RE | Admit: 2013-04-19 | Discharge: 2013-04-19 | Disposition: A | Discharge status: Home / Self Care | Payer: Medicare (Managed Care) | Source: Ambulatory Visit | Attending: Family Medicine | Admitting: Family Medicine

## 2013-04-19 DIAGNOSIS — M8580 Other specified disorders of bone density and structure, unspecified site: Secondary | ICD-10-CM

## 2013-05-02 ENCOUNTER — Telehealth: Payer: Self-pay | Admitting: Gynecologic Oncology

## 2013-05-02 NOTE — Telephone Encounter (Signed)
Spoke with the patient about appointment.  She last saw Dr. Fermin Schwab in April 2014 and needed to follow up in one year.  She states she will have to contact [facility] to have a referral to see Dr. Fermin Schwab before an appt could be scheduled.  Instructed to call for any questions or concerns.

## 2013-05-24 ENCOUNTER — Ambulatory Visit (INDEPENDENT_AMBULATORY_CARE_PROVIDER_SITE_OTHER): Payer: Medicare (Managed Care) | Admitting: Gastroenterology

## 2013-05-24 ENCOUNTER — Encounter: Payer: Self-pay | Admitting: Gastroenterology

## 2013-05-24 VITALS — BP 94/58 | HR 80 | Ht 65.75 in | Wt 113.0 lb

## 2013-05-24 DIAGNOSIS — I82409 Acute embolism and thrombosis of unspecified deep veins of unspecified lower extremity: Secondary | ICD-10-CM

## 2013-05-24 DIAGNOSIS — Z86010 Personal history of colonic polyps: Secondary | ICD-10-CM | POA: Insufficient documentation

## 2013-05-24 DIAGNOSIS — R197 Diarrhea, unspecified: Secondary | ICD-10-CM

## 2013-05-24 DIAGNOSIS — K529 Noninfective gastroenteritis and colitis, unspecified: Secondary | ICD-10-CM

## 2013-05-24 NOTE — Progress Notes (Addendum)
_                                                                                                                History of Present Illnes This 79 year old Afro-American female with history of endometrial CA, status post chemotherapy and radiation therapy, DVT and pulmonary emboli, on Coumadin, seizure disorder, was referred for evaluation of diarrhea.This is been a chronic problem since her radiation and chemotherapy in 2008.  She will typically have multiple loose stools daily.  If she takes one or 2 Imodium she may go up to 2 days without a bowel movement and she becomes fearful of constipation.  UGI in July, 2014 demonstrated a nonspecific esophageal motility disorder, trace laryngeal penetration was otherwise normal.   Endoscopy in April, 2012 was normal.  Random esophageal biopsies were negative for eosinophilic esophagiti.  Barium swallow in 2012 demonstrated mild presbyesophagus.  Colonoscopy in 2011 was pertinent for several polyps that were removed.  Pathology demonstrated adenomas.  She suffers from protein calorie malnutrition and takes supplements.  Her weight actually has increased from 97 pounds in 2012-113 pounds currently.  On one occasion she saw minimal amounts of blood on the toilet tissue with straining.  Past Medical History  Diagnosis Date  . Endometrial ca   . Pulmonary embolus   . DVT (deep venous thrombosis)   . Chronic diarrhea   . Insomnia   . History of D&C   . Depression   . History of sinus surgery   . Multiple thyroid nodules 12/07    b/l nodules thyroid   . Anorexia   . Radiation Jan.3-Feb.6,2008    External Beam to pelvis  . Radiation UJW.11,91 and 27/2008    Intracavitary brachytherapy  . Anxiety   . Neuropathy   . Hypokalemia   . Hiatal hernia   . Seizure disorder    Past Surgical History  Procedure Laterality Date  . Rotator cuff repair Right   . Antroscopy      maxillary  . Ankle fracture repair Left   . Ivc filter  2007   s/p DVT and P.E>  . Laparoscopic assisted radical vaginal hysterectomy w/ node biopsy  08/11/05  . Abdominal hysterectomy  08/11/05    tah,bso,pelvic lymphadenectomy  . Colonoscopy  2012    benign colonic polyps  . Esophagogastroduodenoscopy  2012    neg  . Cholecystectomy    . Lymphadenectomy     family history includes Brain cancer in her brother; Breast cancer in her sister; Lung cancer in her brother; Stomach cancer in her sister. Current Outpatient Prescriptions  Medication Sig Dispense Refill  . acetaminophen (TYLENOL) 650 MG CR tablet Take 650 mg by mouth every 8 (eight) hours as needed for pain.      . calcium carbonate (TUMS EX) 750 MG chewable tablet Chew 1 tablet by mouth 2 (two) times daily.      . cholecalciferol (VITAMIN D) 1000 UNITS tablet Take 1,000 Units by mouth daily.      Marland Kitchen loperamide (  IMODIUM) 2 MG capsule Take 1 mg by mouth as needed for diarrhea or loose stools.       Marland Kitchen LORazepam (ATIVAN) 1 MG tablet Take 1 tablet by mouth in the morning, 1/2 tablet in the afternoon and 1/2  Tab at betime      . mirtazapine (REMERON) 15 MG tablet Take 15 mg by mouth at bedtime.      . Nutritional Supplements (BOOST BREEZE) LIQD Take 230 mLs by mouth daily.      . Nutritional Supplements (BOOST PUDDING PO) Take 1 Container by mouth daily.      . Omeprazole (PRILOSEC PO) Take by mouth daily.      . traZODone (DESYREL) 50 MG tablet Take 50 mg by mouth at bedtime as needed for sleep.      Marland Kitchen warfarin (COUMADIN) 5 MG tablet Take 2.5-5 mg by mouth as directed.        No current facility-administered medications for this visit.   Allergies as of 05/24/2013 - Review Complete 05/24/2013  Allergen Reaction Noted  . Aspridrox [aspirin buf(alhyd-mghyd-cacar)] Other (See Comments) 02/17/2010  . Codeine Other (See Comments) 02/17/2010  . Darvocet [propoxyphene n-acetaminophen] Other (See Comments) 02/17/2010  . Esomeprazole magnesium Other (See Comments) 02/17/2010  . Iodine Other (See  Comments) 12/12/2010  . Morphine and related Other (See Comments) 02/17/2010  . Sulfa antibiotics Other (See Comments) 02/17/2010  . Sulfate Other (See Comments) 02/17/2010  . Tramadol Other (See Comments) 12/12/2010    reports that she has never smoked. She has never used smokeless tobacco. She reports that she does not drink alcohol or use illicit drugs.     Review of Systems: She complains of lower extremity edema and painful feet when walking consistent with a neuropathy Pertinent positive and negative review of systems were noted in the above HPI section. All other review of systems were otherwise negative.  Vital signs were reviewed in today's medical record Physical Exam: General: Thin female in no acute distress Skin: anicteric Head: Normocephalic and atraumatic Eyes:  sclerae anicteric, EOMI Ears: Normal auditory acuity Mouth: No deformity or lesions Neck: Supple, no masses or thyromegaly Lungs: Clear throughout to auscultation Heart: Regular rate and rhythm; no murmurs, rubs or bruits Abdomen: Soft, non tender and non distended. No masses, hepatosplenomegaly or hernias noted. Normal Bowel sounds Rectal:deferred Musculoskeletal: Symmetrical with no gross deformities  Skin: No lesions on visible extremities Pulses:  Normal pulses noted Extremities: No clubbing, cyanosis,  or deformities noted.  There is trace pedal edema Neurological: Alert oriented x 4, grossly nonfocal Cervical Nodes:  No significant cervical adenopathy Inguinal Nodes: No significant inguinal adenopathy Psychological:  Alert and cooperative. Normal mood and affect  See Assessment and Plan under Problem List

## 2013-05-24 NOTE — Patient Instructions (Signed)
Go to the basement for labs today Follow up in 6 weeks

## 2013-05-24 NOTE — Assessment & Plan Note (Signed)
Followup colonoscopy 2016 

## 2013-05-24 NOTE — Assessment & Plan Note (Signed)
She remains on Coumadin

## 2013-05-24 NOTE — Assessment & Plan Note (Signed)
Chronic diarrhea may be do to malabsorption related to radiation therapy.  It is her chronic but actually well controlled with very low-dose Imodium.  I explained to the patient that it is acceptable to go a couple days without a bowel movement and that I would not deem this constipation.  I encouraged her to take 1-2 Imodium as needed for diarrhea and, if this controls her symptoms, not to pursue workup or therapy any further.  I have ordered stool for lactoferrin and [place] stain.  She probably ought to receive B12 injections as well.

## 2013-08-04 ENCOUNTER — Encounter: Payer: Self-pay | Admitting: Gynecology

## 2013-08-04 ENCOUNTER — Ambulatory Visit: Payer: Medicare (Managed Care) | Attending: Gynecology | Admitting: Gynecology

## 2013-08-04 ENCOUNTER — Other Ambulatory Visit (HOSPITAL_COMMUNITY)
Admission: RE | Admit: 2013-08-04 | Discharge: 2013-08-04 | Disposition: A | Discharge status: Home / Self Care | Payer: Medicare (Managed Care) | Source: Ambulatory Visit | Attending: Gynecology | Admitting: Gynecology

## 2013-08-04 VITALS — BP 133/67 | HR 72 | Temp 98.7°F | Resp 18 | Ht 65.75 in | Wt 124.4 lb

## 2013-08-04 DIAGNOSIS — Z8542 Personal history of malignant neoplasm of other parts of uterus: Secondary | ICD-10-CM

## 2013-08-04 DIAGNOSIS — Z7901 Long term (current) use of anticoagulants: Secondary | ICD-10-CM | POA: Insufficient documentation

## 2013-08-04 DIAGNOSIS — Z86711 Personal history of pulmonary embolism: Secondary | ICD-10-CM | POA: Diagnosis not present

## 2013-08-04 DIAGNOSIS — G40909 Epilepsy, unspecified, not intractable, without status epilepticus: Secondary | ICD-10-CM | POA: Diagnosis not present

## 2013-08-04 DIAGNOSIS — C549 Malignant neoplasm of corpus uteri, unspecified: Secondary | ICD-10-CM | POA: Insufficient documentation

## 2013-08-04 DIAGNOSIS — Z800 Family history of malignant neoplasm of digestive organs: Secondary | ICD-10-CM | POA: Insufficient documentation

## 2013-08-04 DIAGNOSIS — Z124 Encounter for screening for malignant neoplasm of cervix: Secondary | ICD-10-CM | POA: Diagnosis present

## 2013-08-04 DIAGNOSIS — Z801 Family history of malignant neoplasm of trachea, bronchus and lung: Secondary | ICD-10-CM | POA: Insufficient documentation

## 2013-08-04 DIAGNOSIS — C541 Malignant neoplasm of endometrium: Secondary | ICD-10-CM

## 2013-08-04 DIAGNOSIS — Z808 Family history of malignant neoplasm of other organs or systems: Secondary | ICD-10-CM | POA: Diagnosis not present

## 2013-08-04 DIAGNOSIS — Z923 Personal history of irradiation: Secondary | ICD-10-CM

## 2013-08-04 DIAGNOSIS — Z9221 Personal history of antineoplastic chemotherapy: Secondary | ICD-10-CM

## 2013-08-04 DIAGNOSIS — Z86718 Personal history of other venous thrombosis and embolism: Secondary | ICD-10-CM | POA: Diagnosis not present

## 2013-08-04 DIAGNOSIS — Z1151 Encounter for screening for human papillomavirus (HPV): Secondary | ICD-10-CM | POA: Diagnosis present

## 2013-08-04 DIAGNOSIS — Z7982 Long term (current) use of aspirin: Secondary | ICD-10-CM | POA: Insufficient documentation

## 2013-08-04 DIAGNOSIS — Z803 Family history of malignant neoplasm of breast: Secondary | ICD-10-CM | POA: Insufficient documentation

## 2013-08-04 NOTE — Progress Notes (Signed)
Consult Note: Gyn-Onc   [name] 79 y.o. female  Chief Complaint  Patient presents with  . Endometrial Cancer    Assessment : Stage IIIc endometrial cancer 2007. Clinically free of disease  Plan: The patient is congratulated for her weight gain and overall improved functional status. Pap smears obtained.Marland Kitchen She will return to see me in one year.  Interval History:  The patient returns today for annual examination. Since her last visit she's had remarkable improvement in her functional status and weight gaining 25 pounds. She's been engaged in the [facility] program providing meals and activities in the community setting for seen years and disabled persons.  From a gynecologic cancer point of view, she is doing well.. She denies any other GI or GU symptoms. She denies any pelvic pain pressure vaginal bleeding or discharge.  HPI:In August of 2007 the patient underwent quarter laparotomy total abdominal hysterectomy and pelvic lymphadenectomy for stage IIIC endometrial papillary serous carcinoma. She had extensive involvement of lymph nodes. Postoperatively she was treated with sandwich therapy using carboplatin and Taxol as well as extended field radiation therapy. Remarkably she's remained free of disease for the past 8 years.   Review of Systems:10 point review of systems is negative except as noted in interval history.   Vitals: Blood pressure 133/67, pulse 72, temperature 98.7 F (37.1 C), temperature source Oral, resp. rate 18, height 5' 5.75" (1.67 m), weight 124 lb 6.4 oz (56.427 kg).  Physical Exam: General : The patient is a healthy woman in no acute distress she is very thin.Marland Kitchen  HEENT: normocephalic, extraoccular movements normal; neck is supple without thyromegally  Lynphnodes: Supraclavicular and inguinal nodes not enlarged  Abdomen: Soft, non-tender, no ascites, no organomegally, no masses, no hernias  Pelvic:  EGBUS: Normal female , atrophic Vagina: Normal, no lesions ,  atrophic and foreshortened Urethra and Bladder: Normal, non-tender  Cervix: Surgically absent  Uterus: Surgically absent  Bi-manual examination: Non-tender; no adenxal masses or nodularity  Rectal: normal sphincter tone, no masses, no blood  Lower extremities: No edema or varicosities. Normal range of motion      Allergies  Allergen Reactions  . Aspridrox [Aspirin Buf(Alhyd-Mghyd-Cacar)] Other (See Comments)    unknown  . Codeine Other (See Comments)    Unknown, also Tylenol with Codeine  . Darvocet [Propoxyphene N-Acetaminophen] Other (See Comments)    unknown  . Esomeprazole Magnesium Other (See Comments)    unknown  . Iodine Other (See Comments)    unknown  . Morphine And Related Other (See Comments)    unknown  . Sulfa Antibiotics Other (See Comments)    unknown  . Sulfate Other (See Comments)    unknown  . Tramadol Other (See Comments)    unknown    Past Medical History  Diagnosis Date  . Endometrial ca   . Pulmonary embolus   . DVT (deep venous thrombosis)   . Chronic diarrhea   . Insomnia   . History of D&C   . Depression   . History of sinus surgery   . Multiple thyroid nodules 12/07    b/l nodules thyroid   . Anorexia   . Radiation Jan.3-Feb.6,2008    External Beam to pelvis  . Radiation SWF.09,32 and 27/2008    Intracavitary brachytherapy  . Anxiety   . Neuropathy   . Hypokalemia   . Hiatal hernia   . Seizure disorder     Past Surgical History  Procedure Laterality Date  . Rotator cuff repair Right   .  Antroscopy      maxillary  . Ankle fracture repair Left   . Ivc filter  2007    s/p DVT and P.E>  . Laparoscopic assisted radical vaginal hysterectomy w/ node biopsy  08/11/05  . Abdominal hysterectomy  08/11/05    tah,bso,pelvic lymphadenectomy  . Colonoscopy  2012    benign colonic polyps  . Esophagogastroduodenoscopy  2012    neg  . Cholecystectomy    . Lymphadenectomy      Current Outpatient Prescriptions  Medication Sig Dispense  Refill  . acetaminophen (TYLENOL) 650 MG CR tablet Take 650 mg by mouth every 8 (eight) hours as needed for pain.      . calcium carbonate (TUMS EX) 750 MG chewable tablet Chew 1 tablet by mouth 2 (two) times daily.      . cholecalciferol (VITAMIN D) 1000 UNITS tablet Take 1,000 Units by mouth daily.      Marland Kitchen loperamide (IMODIUM) 2 MG capsule Take 1 mg by mouth as needed for diarrhea or loose stools.       Marland Kitchen LORazepam (ATIVAN) 1 MG tablet Take 1 tablet by mouth in the morning, 1/2 tablet in the afternoon and 1/2  Tab at betime      . mirtazapine (REMERON) 15 MG tablet Take 15 mg by mouth at bedtime.      . Nutritional Supplements (BOOST BREEZE) LIQD Take 230 mLs by mouth daily.      . Nutritional Supplements (BOOST PUDDING PO) Take 1 Container by mouth daily.      . Omeprazole (PRILOSEC PO) Take by mouth daily.      Marland Kitchen warfarin (COUMADIN) 5 MG tablet Take 2.5-5 mg by mouth as directed.       . traZODone (DESYREL) 50 MG tablet Take 50 mg by mouth at bedtime as needed for sleep.       No current facility-administered medications for this visit.    History   Social History  . Marital Status: Married    Spouse Name: N/A    Number of Children: 81  . Years of Education: N/A   Occupational History  . RETIRED, Teacher Asst/Librarian    Social History Main Topics  . Smoking status: Never Smoker   . Smokeless tobacco: Never Used  . Alcohol Use: No  . Drug Use: No  . Sexual Activity: Not on file   Other Topics Concern  . Not on file   Social History Narrative  . No narrative on file    Family History  Problem Relation Age of Onset  . Stomach cancer Sister   . Brain cancer Brother   . Lung cancer Brother   . Breast cancer Sister       Alvino Chapel, MD 08/04/2013, 4:22 PM

## 2013-08-04 NOTE — Addendum Note (Signed)
Addended by: Lucile Crater on: 08/04/2013 04:57 PM   Modules accepted: Orders

## 2013-08-08 LAB — CYTOLOGY - PAP

## 2013-08-18 ENCOUNTER — Telehealth: Payer: Self-pay | Admitting: *Deleted

## 2013-08-18 NOTE — Telephone Encounter (Signed)
Called pt notified of pap smear results. Pt to f/u in 1 yr with repeat pap per MD

## 2013-11-06 ENCOUNTER — Encounter: Payer: Self-pay | Admitting: Gynecology

## 2014-08-29 ENCOUNTER — Encounter: Payer: Self-pay | Admitting: Gastroenterology

## 2014-08-29 ENCOUNTER — Ambulatory Visit (INDEPENDENT_AMBULATORY_CARE_PROVIDER_SITE_OTHER): Payer: Medicare (Managed Care) | Admitting: Gastroenterology

## 2014-08-29 VITALS — BP 120/72 | HR 60 | Ht 65.75 in | Wt 123.0 lb

## 2014-08-29 DIAGNOSIS — Z860100 Personal history of colon polyps, unspecified: Secondary | ICD-10-CM

## 2014-08-29 DIAGNOSIS — K529 Noninfective gastroenteritis and colitis, unspecified: Secondary | ICD-10-CM

## 2014-08-29 MED ORDER — NA SULFATE-K SULFATE-MG SULF 17.5-3.13-1.6 GM/177ML PO SOLN: 1.0000 | Freq: Once | ORAL | Status: DC

## 2014-08-29 NOTE — Patient Instructions (Signed)
You have been scheduled for a colonoscopy. Please follow written instructions given to you at your visit today.  Please pick up your prep supplies at the pharmacy within the next 1-3 days. If you use inhalers (even only as needed), please bring them with you on the day of your procedure. Your physician has requested that you go to www.startemmi.com and enter the access code given to you at your visit today. This web site gives a general overview about your procedure. However, you should still follow specific instructions given to you by our office regarding your preparation for the procedure.  Use Benefiber once daily

## 2014-08-29 NOTE — Progress Notes (Signed)
      History of Present Illness:  [name] has returned with basically the same complaints that she had at her previous visit.  She goes between diarrhea and constipation.  When she has diarrhea she will take 1-2 Imodium.  If she does not move her bowels the next day she'll take fruit juice which helps distantly her bowels and then may have a loose stool 1-2 days later.  Colonoscopy in 2011 was pertinent for 3 adenomatous polyps which were removed.  Random colon biopsies were negative.    Review of Systems: Pertinent positive and negative review of systems were noted in the above HPI section. All other review of systems were otherwise negative.    Current Medications, Allergies, Past Medical History, Past Surgical History, Family History and Social History were reviewed in [facility] record  Vital signs were reviewed in today's medical record. Physical Exam: General: Elderly thin female in no acute distress Skin: anicteric Head: Normocephalic and atraumatic Eyes:  sclerae anicteric, EOMI Ears: Normal auditory acuity Mouth: No deformity or lesions Lymph Nodes: no lymphadenopathy Lungs: Clear throughout to auscultation Heart: Regular rate and rhythm; no murmurs, rubs or brui: Gastroinestinal:  Soft, non tender and non distended. No masses, hepatosplenomegaly or hernias noted. Normal Bowel sounds Rectal:deferred Musculoskeletal: Symmetrical with no gross deformities  Pulses:  Normal pulses noted Extremities: No clubbing, cyanosis, edema or deformities noted Neurological: Alert oriented x 4, grossly nonfocal.  She has a resting tremor Psychological:  Alert and cooperative. Normal mood and affect  See Assessment and Plan under Problem List

## 2014-08-29 NOTE — Assessment & Plan Note (Signed)
Plan follow-up colonoscopy.  The risk of holding anticoagulation therapy or antiplatelet medications was discussed including the increased risk for thromboembolic disease that may include DVT, pulmonary emboli and stroke. The patient understands this risk and is willing to proceed with temporally holding the medication provided that this is approved by her PCP or cardiologist.

## 2014-08-29 NOTE — Assessment & Plan Note (Signed)
She is in a cycle of constipation and diarrhea.  She has the notion that she has to have a daily bowel movement.  I tried to distribution her about that notion.    Recommendations #1 daily fiber supplementation #2 Imodium 1-2 tabs as needed for diarrhea

## 2014-09-03 ENCOUNTER — Telehealth: Payer: Self-pay | Admitting: *Deleted

## 2014-09-03 NOTE — Telephone Encounter (Signed)
Need to know who her prescriber is of her coumadin

## 2014-09-11 NOTE — Telephone Encounter (Signed)
Patient states that Dr Barney Drain prescribes her warfarin. We have sent a note to Dr Bradd Burner regarding warfarin clearance and will await his response.

## 2014-09-14 NOTE — Telephone Encounter (Signed)
I have spoken to patient and have advised that per Dr Bradd Burner, she may hold her warfarin 5 days prior to her October procedure. Patient verbalizes understanding and voiced my instructions back to me. See Dr Etheleen Sia reponse under "media" tab in EPIC.

## 2014-10-19 ENCOUNTER — Telehealth: Payer: Self-pay | Admitting: Gastroenterology

## 2014-10-19 MED ORDER — NA SULFATE-K SULFATE-MG SULF 17.5-3.13-1.6 GM/177ML PO SOLN: 1.0000 | Freq: Once | ORAL | Status: DC

## 2014-10-19 NOTE — Telephone Encounter (Signed)
Resent Suprep to [facility] as requested faxed in to the number provided

## 2014-10-30 ENCOUNTER — Encounter: Payer: Medicare (Managed Care) | Admitting: Gastroenterology

## 2014-12-04 ENCOUNTER — Encounter: Payer: Self-pay | Admitting: Gastroenterology

## 2014-12-04 ENCOUNTER — Ambulatory Visit (AMBULATORY_SURGERY_CENTER): Payer: Medicare (Managed Care) | Admitting: Gastroenterology

## 2014-12-04 VITALS — BP 141/76 | HR 65 | Temp 97.1°F | Resp 35 | Ht 65.75 in | Wt 123.0 lb

## 2014-12-04 DIAGNOSIS — Z860100 Personal history of colon polyps, unspecified: Secondary | ICD-10-CM

## 2014-12-04 DIAGNOSIS — K529 Noninfective gastroenteritis and colitis, unspecified: Secondary | ICD-10-CM | POA: Diagnosis present

## 2014-12-04 DIAGNOSIS — D122 Benign neoplasm of ascending colon: Secondary | ICD-10-CM

## 2014-12-04 MED ORDER — SODIUM CHLORIDE 0.9 % IV SOLN: 500.0000 mL | INTRAVENOUS | Status: DC

## 2014-12-04 NOTE — Progress Notes (Signed)
Called to room to assist during endoscopic procedure.  Patient ID and intended procedure confirmed with present staff. Received instructions for my participation in the procedure from the performing physician.  

## 2014-12-04 NOTE — Op Note (Signed)
[facility]  [address] [place], [address]   COLONOSCOPY PROCEDURE REPORT  PATIENT: [name], [name]  MR#: [id number] BIRTHDATE: 1934/08/12 , 80  yrs. old GENDER: female ENDOSCOPIST: Harl Bowie, MD REFERRED [id number] Bradd Burner, M.D. PROCEDURE DATE:  12/04/2014 PROCEDURE:   Colonoscopy, screening and Colonoscopy with snare polypectomy First Screening Colonoscopy - Avg.  risk and is 50 yrs.  old or older - No.  Prior Negative Screening - Now for repeat screening. N/A  History of Adenoma - Now for follow-up colonoscopy & has been > or = to 3 yrs.  Yes hx of adenoma.  Has been 3 or more years since last colonoscopy.  Polyps removed today? Yes ASA CLASS:   Class II INDICATIONS:Screening for colonic neoplasia, Clinically significant diarrhea of unexplained origin, and PH Colon Adenoma. MEDICATIONS: Propofol 150 mg IV  DESCRIPTION OF PROCEDURE:   After the risks benefits and alternatives of the procedure were thoroughly explained, informed consent was obtained.  The digital rectal exam revealed no abnormalities of the rectum.   The LB [id number]  endoscope was introduced through the anus and advanced to the cecum, which was identified by both the appendix and ileocecal valve. No adverse events experienced.   The quality of the prep was good.  The instrument was then slowly withdrawn as the colon was fully examined. Estimated blood loss is zero unless otherwise noted in this procedure report.    COLON FINDINGS: Small internal hemorrhoids were found. Mild radiation proctitis and weak anal sphinter tone.  A sessile polyp ranging between 5-71mm in size was found in the ascending colon.  A polypectomy was performed with a cold snare.  The resection was complete, the polyp tissue was completely retrieved and sent to histology.  Retroflexed views revealed internal hemorrhoids. The time to cecum = 6.27 Withdrawal time =8.49      The scope was withdrawn and the  procedure completed. COMPLICATIONS: There were no immediate complications.  ENDOSCOPIC IMPRESSION: 1.  Poor anal sphincter tone and mild radiation proctitis. 2.  Sessile polyp ranging between 5-75mm in size was found in the ascending colon; polypectomy was performed with a cold snare  RECOMMENDATIONS: 1.  Given your age, you will not need another colonoscopy for colon cancer screening or polyp surveillance.  These types of tests usually stop around the age 25. 2. Hold Coumadin for 5 additional days before restarting  eSigned:  Harl Bowie, MD 12/04/2014 1:44 PM

## 2014-12-04 NOTE — Patient Instructions (Addendum)
YOU HAD AN ENDOSCOPIC PROCEDURE TODAY AT [facility]:   Refer to the procedure report that was given to you for any specific questions about what was found during the examination.  If the procedure report does not answer your questions, please call your gastroenterologist to clarify.  If you requested that your care partner not be given the details of your procedure findings, then the procedure report has been included in a sealed envelope for you to review at your convenience later.  YOU SHOULD EXPECT: Some feelings of bloating in the abdomen. Passage of more gas than usual.  Walking can help get rid of the air that was put into your GI tract during the procedure and reduce the bloating. If you had a lower endoscopy (such as a colonoscopy or flexible sigmoidoscopy) you may notice spotting of blood in your stool or on the toilet paper. If you underwent a bowel prep for your procedure, you may not have a normal bowel movement for a few days.  Please Note:  You might notice some irritation and congestion in your nose or some drainage.  This is from the oxygen used during your procedure.  There is no need for concern and it should clear up in a day or so.  SYMPTOMS TO REPORT IMMEDIATELY:   Following lower endoscopy (colonoscopy or flexible sigmoidoscopy):  Excessive amounts of blood in the stool  Significant tenderness or worsening of abdominal pains  Swelling of the abdomen that is new, acute  Fever of 100F or higher  Black, tarry-looking stools  For urgent or emergent issues, a gastroenterologist can be reached at any hour by calling [phone].   DIET: Your first meal following the procedure should be a small meal and then it is ok to progress to your normal diet. Heavy or fried foods are harder to digest and may make you feel nauseous or bloated.  Likewise, meals heavy in dairy and vegetables can increase bloating.  Drink plenty of fluids but you should avoid alcoholic  beverages for 24 hours.  ACTIVITY:  You should plan to take it easy for the rest of today and you should NOT DRIVE or use heavy machinery until tomorrow (because of the sedation medicines used during the test).    FOLLOW UP: Our staff will call the number listed on your records the next business day following your procedure to check on you and address any questions or concerns that you may have regarding the information given to you following your procedure. If we do not reach you, we will leave a message.  However, if you are feeling well and you are not experiencing any problems, there is no need to return our call.  We will assume that you have returned to your regular daily activities without incident.  If any biopsies were taken you will be contacted by phone or by letter within the next 1-3 weeks.  Please call us at [phone] if you have not heard about the biopsies in 3 weeks.    SIGNATURES/CONFIDENTIALITY: You and/or your care partner have signed paperwork which will be entered into your electronic medical record.  These signatures attest to the fact that that the information above on your After Visit Summary has been reviewed and is understood.  Full responsibility of the confidentiality of this discharge information lies with you and/or your care-partner.  Hold you coumadin for 5 more days per Dr. Delane Ginger.  You may restart on December 4,2016.

## 2014-12-04 NOTE — Progress Notes (Signed)
Stable to RR 

## 2014-12-05 ENCOUNTER — Telehealth: Payer: Self-pay | Admitting: Gastroenterology

## 2014-12-05 ENCOUNTER — Other Ambulatory Visit: Payer: Self-pay

## 2014-12-05 ENCOUNTER — Telehealth: Payer: Self-pay | Admitting: *Deleted

## 2014-12-05 DIAGNOSIS — R159 Full incontinence of feces: Secondary | ICD-10-CM

## 2014-12-05 NOTE — Telephone Encounter (Signed)
  Follow up Call-  Call back number 12/04/2014  Post procedure Call Back phone  # [phone]  Permission to leave phone message Yes     Patient questions:  Do you have a fever, pain , or abdominal swelling? No. Pain Score  0 *  Have you tolerated food without any problems? Yes.    Have you been able to return to your normal activities? Yes.    Do you have any questions about your discharge instructions: Diet   No. Medications  No. Follow up visit  No.  Do you have questions or concerns about your Care? No.  Actions: * If pain score is 4 or above: No action needed, pain <4.  Patient denies any stomach pain at this time. States she was bloated last night.

## 2014-12-05 NOTE — Telephone Encounter (Signed)
Patient reports she is still having liquid stools. Her colonoscopy was yesterday for diarrhea. She denies bloody stools, fever or pain. She wants to take an Imodium AD for diarrhea. Discussed diet and hydration. She will call back if she acutely worsens or fails to improve.

## 2014-12-18 ENCOUNTER — Encounter: Payer: Self-pay | Admitting: Gastroenterology

## 2015-01-08 ENCOUNTER — Ambulatory Visit: Payer: Medicare (Managed Care) | Attending: Physical Therapy | Admitting: Physical Therapy

## 2015-02-15 ENCOUNTER — Ambulatory Visit (HOSPITAL_COMMUNITY)
Admission: RE | Admit: 2015-02-15 | Discharge: 2015-02-15 | Disposition: A | Discharge status: Home / Self Care | Payer: Medicare (Managed Care) | Source: Ambulatory Visit | Attending: Cardiovascular Disease | Admitting: Cardiovascular Disease

## 2015-02-15 ENCOUNTER — Other Ambulatory Visit (HOSPITAL_COMMUNITY): Payer: Self-pay | Admitting: Family Medicine

## 2015-02-15 DIAGNOSIS — Z86718 Personal history of other venous thrombosis and embolism: Secondary | ICD-10-CM | POA: Diagnosis not present

## 2015-02-15 DIAGNOSIS — R52 Pain, unspecified: Secondary | ICD-10-CM

## 2015-02-15 DIAGNOSIS — M79605 Pain in left leg: Secondary | ICD-10-CM | POA: Diagnosis not present

## 2015-02-15 DIAGNOSIS — Z7901 Long term (current) use of anticoagulants: Secondary | ICD-10-CM | POA: Insufficient documentation

## 2016-03-11 ENCOUNTER — Ambulatory Visit (INDEPENDENT_AMBULATORY_CARE_PROVIDER_SITE_OTHER): Payer: Medicare (Managed Care) | Admitting: Gastroenterology

## 2016-03-11 ENCOUNTER — Encounter (INDEPENDENT_AMBULATORY_CARE_PROVIDER_SITE_OTHER): Payer: Self-pay

## 2016-03-11 ENCOUNTER — Encounter: Payer: Self-pay | Admitting: Gastroenterology

## 2016-03-11 VITALS — BP 90/60 | HR 100 | Ht 65.75 in | Wt 101.0 lb

## 2016-03-11 DIAGNOSIS — R1310 Dysphagia, unspecified: Secondary | ICD-10-CM | POA: Diagnosis not present

## 2016-03-11 DIAGNOSIS — K580 Irritable bowel syndrome with diarrhea: Secondary | ICD-10-CM

## 2016-03-11 MED ORDER — CHOLESTYRAMINE 4 G PO PACK: 4.0000 g | PACK | Freq: Every day | ORAL | 12 refills | Status: DC

## 2016-03-11 NOTE — Patient Instructions (Signed)
We have sent the following medications to your pharmacy for you to pick up at your convenience:  We will call [facility] to schedule a barium swallow.   Please keep your follow up with Dr.Nandigam.

## 2016-03-11 NOTE — Progress Notes (Signed)
03/11/2016 [name] [id number] 1934/05/31   HISTORY OF PRESENT ILLNESS:  This is a 82 year old female who is known to Dr. Silverio Decamp.  She had a colonoscopy in November 2016 at which time she was found have poor anal sphincter tone and mild radiation proctitis. 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 No repeat colonoscopy was recommended due to her age. She has a history of IBS alternating from constipation to diarrhea, but diarrhea predominant. She has issues with incontinence due to her poor sphincter tone.  Does not eat for fear of having diarrhea, especially when she has to ride the [employer] bus, etc.  Denies blood or mucus in her stools.  Also has intermittent abdominal pains in the morning when she wakes up, but they resolve as the day goes on.  Has a lot of anxiety.  Has lost 22 pounds since her visit here in 08/2014.    Also reports intermittent dysphagia where it sometimes feels like her food doesn't go down.  Had presbyesophagus on esophagram in 2012.    She is here today with her sister, [name].    Past Medical History:  Diagnosis Date  . Anorexia   . Anxiety   . Blood transfusion without reported diagnosis    1970's  . Chronic diarrhea   . Clotting disorder ([place])   . Depression   . DVT (deep venous thrombosis) ([place])   . Endometrial ca ([place])   . Hiatal hernia   . History of D&C   . History of sinus surgery   . Hypokalemia   . Insomnia   . Multiple thyroid nodules 12/07   b/l nodules thyroid   . Neuropathy ([place])   . Pulmonary embolus ([place])   . Radiation Jan.3-Feb.6,2008   External Beam to pelvis  . Radiation JME.26,83 and 27/2008   Intracavitary brachytherapy  . Seizure disorder [facility])    Past Surgical History:  Procedure Laterality Date  . ABDOMINAL HYSTERECTOMY  08/11/05   tah,bso,pelvic lymphadenectomy  . ankle fracture repair Left   . antroscopy     maxillary  . CHOLECYSTECTOMY    . COLONOSCOPY  2012    benign colonic polyps  . ESOPHAGOGASTRODUODENOSCOPY  2012   neg  . IVC Filter  2007   s/p DVT and P.E>  . LAPAROSCOPIC ASSISTED RADICAL VAGINAL HYSTERECTOMY W/ NODE BIOPSY  08/11/05  . LYMPHADENECTOMY    . rotator cuff repair Right     reports that she has never smoked. She has never used smokeless tobacco. She reports that she does not drink alcohol or use drugs. family history includes Brain cancer in her brother; Breast cancer in her sister; Lung cancer in her brother; Stomach cancer in her sister. Allergies  Allergen Reactions  . Aspridrox [Aspirin Buf(Alhyd-Mghyd-Cacar)] Other (See Comments)    unknown  . Codeine Other (See Comments)    Unknown, also Tylenol with Codeine  . Darvocet [Propoxyphene N-Acetaminophen] Other (See Comments)    unknown  . Esomeprazole Magnesium Other (See Comments)    unknown  . Iodine Other (See Comments)    unknown  . Morphine And Related Other (See Comments)    unknown  . Sulfa Antibiotics Other (See Comments)    unknown  . Sulfate Other (See Comments)    unknown  . Tramadol Other (See Comments)    unknown      Outpatient Encounter Prescriptions as of 03/11/2016  Medication Sig  . acetaminophen (TYLENOL) 650  MG CR tablet Take 650 mg by mouth every 8 (eight) hours as needed for pain.  . calcium carbonate (TUMS EX) 750 MG chewable tablet Chew 1 tablet by mouth 2 (two) times daily.  . cholecalciferol (VITAMIN D) 1000 UNITS tablet Take 1,000 Units by mouth daily.  . Cyanocobalamin (VITAMIN B 12 PO) Take 1 tablet by mouth daily.  Marland Kitchen loperamide (IMODIUM) 2 MG capsule Take 1 mg by mouth as needed for diarrhea or loose stools.   Marland Kitchen LORazepam (ATIVAN) 1 MG tablet Take 1 tablet by mouth in the morning, 1/2 tablet in the afternoon and 1/2  Tab at betime  . mirtazapine (REMERON) 15 MG tablet Take 15 mg by mouth at bedtime.  . Nutritional Supplements (BOOST BREEZE) LIQD Take 230 mLs by mouth daily.  . Nutritional Supplements (BOOST PUDDING PO) Take 1  Container by mouth daily.  . Omeprazole (PRILOSEC PO) Take by mouth daily.  Marland Kitchen warfarin (COUMADIN) 5 MG tablet Take 2.5-5 mg by mouth as directed.   . [DISCONTINUED] traZODone (DESYREL) 50 MG tablet Take 50 mg by mouth at bedtime as needed for sleep.   No facility-administered encounter medications on file as of 03/11/2016.      REVIEW OF SYSTEMS  : All other systems reviewed and negative except where noted in the History of Present Illness.   PHYSICAL EXAM: BP 90/60   Pulse 100   Ht 5' 5.75" (1.67 m)   Wt 101 lb (45.8 kg)   BMI 16.43 kg/m  General:  Thin and frail white female in no acute distress Head: Normocephalic and atraumatic Eyes:  Sclerae anicteric, conjunctiva pink. Ears: Normal auditory acuity Lungs: Clear throughout to auscultation Heart: Regular rate and rhythm Abdomen: Soft, non-distended.  Normal bowel sounds.  Non-tender. Musculoskeletal: Symmetrical with no gross deformities  Skin: No lesions on visible extremities Extremities: No edema  Neurological: Alert oriented x 4, grossly non-focal Psychological:  Alert and cooperative. Normal mood and affect  ASSESSMENT AND PLAN: -IBS with alternating diarrhea and constipation, but more predominant diarrhea.  Also has issues with urgency and incontinence.  Had poor sphincter tone on recent colonoscopy.  May also have some diarrhea related to bile salt s/p cholecystectomy.  Will try questran starting at one packet daily to see if this helps.  Could consider pelvic floor therapy. -Dysphagia:  Had mild presbyesophagus several years ago on esophagram.  This may just be worsening.  Will repeat barium esophagram. -Anorexia and weight loss:  Weight is down 22 pounds since 08/2014.  Does not like to eat for fear of diarrhea.  Is on Remeron 15 mg at bedtime and tolerating well.  Could consider increasing this also.  *Follow-up in 4-6 weeks for reassessment.   CC:  Janifer Adie, MD

## 2016-03-16 NOTE — Progress Notes (Signed)
Reviewed and agree with documentation and assessment and plan. K. Veena Oluwatomisin Deman , MD   

## 2016-03-20 ENCOUNTER — Other Ambulatory Visit: Payer: Self-pay | Admitting: Family Medicine

## 2016-03-20 DIAGNOSIS — R1310 Dysphagia, unspecified: Secondary | ICD-10-CM

## 2016-03-23 ENCOUNTER — Ambulatory Visit
Admission: RE | Admit: 2016-03-23 | Discharge: 2016-03-23 | Disposition: A | Discharge status: Home / Self Care | Payer: Medicare (Managed Care) | Source: Ambulatory Visit | Attending: Family Medicine | Admitting: Family Medicine

## 2016-03-23 DIAGNOSIS — R1310 Dysphagia, unspecified: Secondary | ICD-10-CM

## 2016-04-22 ENCOUNTER — Ambulatory Visit (INDEPENDENT_AMBULATORY_CARE_PROVIDER_SITE_OTHER): Payer: Medicare (Managed Care) | Admitting: Gastroenterology

## 2016-04-22 ENCOUNTER — Encounter: Payer: Self-pay | Admitting: Gastroenterology

## 2016-04-22 ENCOUNTER — Encounter (INDEPENDENT_AMBULATORY_CARE_PROVIDER_SITE_OTHER): Payer: Self-pay

## 2016-04-22 VITALS — BP 100/58 | HR 64 | Ht 67.5 in | Wt 104.1 lb

## 2016-04-22 DIAGNOSIS — E440 Moderate protein-calorie malnutrition: Secondary | ICD-10-CM

## 2016-04-22 DIAGNOSIS — K9089 Other intestinal malabsorption: Secondary | ICD-10-CM

## 2016-04-22 DIAGNOSIS — K589 Irritable bowel syndrome without diarrhea: Secondary | ICD-10-CM | POA: Diagnosis not present

## 2016-04-22 DIAGNOSIS — K219 Gastro-esophageal reflux disease without esophagitis: Secondary | ICD-10-CM | POA: Diagnosis not present

## 2016-04-22 NOTE — Patient Instructions (Signed)
Follow up as needed  Continue current medications

## 2016-04-22 NOTE — Progress Notes (Signed)
[name]    [id number]    1934-05-29  Primary Care Physician:KOEHLER,ROBERT Hart Carwin, MD  Referring Physician: Janifer Adie, MD [place], [place] [address]  Chief complaint: IBS, GERD HPI: 82 year old female currently controlled in pace program here for follow-up visit. She was last seen by Alonza Bogus on March 7,2018. Patient reports significant improvement of diarrhea with cholestyramine. She is currently having one to 2 bowel movements daily. Dysphagia has also improved with omeprazole daily. Barium esophagram did not show any significant abnormality, from passage of barium tablet through EG junction. She has significant left lower extremity swelling with diagnosis of DVT and is on Coumadin for anticoagulation. Patient has no specific GI symptoms. Denies any nausea, vomiting, abdominal pain, melena or bright red blood per rectum    Outpatient Encounter Prescriptions as of 04/22/2016  Medication Sig  . acetaminophen (TYLENOL) 650 MG CR tablet Take 650 mg by mouth every 8 (eight) hours as needed for pain.  . calcium carbonate (TUMS EX) 750 MG chewable tablet Chew 1 tablet by mouth 2 (two) times daily.  . cholecalciferol (VITAMIN D) 1000 UNITS tablet Take 1,000 Units by mouth daily.  . cholestyramine (QUESTRAN) 4 g packet Take 1 packet (4 g total) by mouth daily. Take 1.5 hours away from other medications.  . Cyanocobalamin (VITAMIN B 12 PO) Take 1 tablet by mouth daily.  Marland Kitchen loperamide (IMODIUM) 2 MG capsule Take 1 mg by mouth as needed for diarrhea or loose stools.   Marland Kitchen LORazepam (ATIVAN) 1 MG tablet Take 1 tablet by mouth in the morning, 1/2 tablet in the afternoon and 1/2  Tab at betime  . mirtazapine (REMERON) 15 MG tablet Take 15 mg by mouth at bedtime.  . Nutritional Supplements (BOOST BREEZE) LIQD Take 230 mLs by mouth daily.  . Nutritional Supplements (BOOST PUDDING PO) Take 1 Container by mouth daily.  . Omeprazole (PRILOSEC PO) Take by mouth  daily.  Marland Kitchen warfarin (COUMADIN) 5 MG tablet Take 2.5-5 mg by mouth as directed.    No facility-administered encounter medications on file as of 04/22/2016.     Allergies as of 04/22/2016 - Review Complete 04/22/2016  Allergen Reaction Noted  . Aspridrox [aspirin buf(alhyd-mghyd-cacar)] Other (See Comments) 02/17/2010  . Codeine Other (See Comments) 02/17/2010  . Darvocet [propoxyphene n-acetaminophen] Other (See Comments) 02/17/2010  . Esomeprazole magnesium Other (See Comments) 02/17/2010  . Iodine Other (See Comments) 12/12/2010  . Morphine and related Other (See Comments) 02/17/2010  . Sulfa antibiotics Other (See Comments) 02/17/2010  . Sulfate Other (See Comments) 02/17/2010  . Tramadol Other (See Comments) 12/12/2010    Past Medical History:  Diagnosis Date  . Anorexia   . Anxiety   . Blood transfusion without reported diagnosis    1970's  . Chronic diarrhea   . Clotting disorder ([place])   . Depression   . DVT (deep venous thrombosis) ([place])   . Endometrial ca ([place])   . Hiatal hernia   . History of D&C   . History of sinus surgery   . Hypokalemia   . Insomnia   . Multiple thyroid nodules 12/07   b/l nodules thyroid   . Neuropathy   . Pulmonary embolus ([place])   . Radiation Jan.3-Feb.6,2008   External Beam to pelvis  . Radiation YQM.57,84 and 27/2008   Intracavitary brachytherapy  . Seizure disorder [facility])     Past Surgical History:  Procedure Laterality Date  . ABDOMINAL HYSTERECTOMY  08/11/05   tah,bso,pelvic lymphadenectomy  . ankle fracture repair Left   . antroscopy     maxillary  . CHOLECYSTECTOMY    . COLONOSCOPY  2012   benign colonic polyps  . ESOPHAGOGASTRODUODENOSCOPY  2012   neg  . IVC Filter  2007   s/p DVT and P.E>  . LAPAROSCOPIC ASSISTED RADICAL VAGINAL HYSTERECTOMY W/ NODE BIOPSY  08/11/05  . LYMPHADENECTOMY    . rotator cuff repair Right     Family History  Problem Relation Age of Onset  . Stomach cancer Sister   . Brain cancer Brother     . Lung cancer Brother   . Breast cancer Sister   . Colon cancer Neg Hx     Social History   Social History  . Marital status: Married    Spouse name: N/A  . Number of children: 5  . Years of education: N/A   Occupational History  . RETIRED, Teacher Asst/Librarian Retired   Social History Main Topics  . Smoking status: Never Smoker  . Smokeless tobacco: Never Used  . Alcohol use No  . Drug use: No  . Sexual activity: Not on file   Other Topics Concern  . Not on file   Social History Narrative  . No narrative on file      Review of systems: Review of Systems  Constitutional: Negative for fever and chills.  HENT: Negative.   Eyes: Negative for blurred vision.  Respiratory: Negative for cough, shortness of breath and wheezing.   Cardiovascular: Negative for chest pain and palpitations.  Gastrointestinal: as per HPI Genitourinary: Negative for dysuria, urgency, frequency and hematuria.  Musculoskeletal: Positive for myalgias, back pain and joint pain.  Skin: Negative for itching and rash.  Neurological: Positive for tremors, negative for focal weakness, seizures and loss of consciousness.  Endo/Heme/Allergies: Positive for seasonal allergies.  Psychiatric/Behavioral: Positive for anxiety and depression; negative for suicidal ideas and hallucinations.  All other systems reviewed and are negative.   Physical Exam: Vitals:   04/22/16 1329  BP: (!) 100/58  Pulse: 64   Body mass index is 16.07 kg/m. Gen:      No acute distress, cachectic appearing HEENT:  EOMI, sclera anicteric Neck:     No masses; no thyromegaly Lungs:    Clear to auscultation bilaterally; normal respiratory effort CV:         Regular rate and rhythm; no murmurs Abd:      + bowel sounds; soft, non-tender; no palpable masses, no distension Ext:    Left edema++; adequate peripheral perfusion Skin:      Warm and dry; no rash Neuro: alert and oriented x 3 Psych: normal mood and affect  Data  Reviewed:  Reviewed labs, radiology imaging, old records and pertinent past GI work up   Assessment and Plan/Recommendations:  82 year old female multiple co morbidities, anorexia, chronic GERD and irritable bowel syndrome here for follow-up Patient is doing better from GI perspective She has gained 2 pounds since last visit Advised her to continue high protein high calorie diet  IBS with diarrhea: Continue cholestyramine daily  GERD: Continue PPI daily and antireflux measures  Dysphagia has resolved  Return as needed  Damaris Hippo , MD [phone] Mon-Fri 8a-5p [phone] after 5p, weekends, holidays  CC: Janifer Adie, MD

## 2016-06-03 ENCOUNTER — Other Ambulatory Visit: Payer: Self-pay | Admitting: *Deleted

## 2016-06-03 DIAGNOSIS — M7989 Other specified soft tissue disorders: Secondary | ICD-10-CM

## 2016-07-23 ENCOUNTER — Ambulatory Visit (INDEPENDENT_AMBULATORY_CARE_PROVIDER_SITE_OTHER): Payer: Medicare (Managed Care) | Admitting: Vascular Surgery

## 2016-07-23 ENCOUNTER — Ambulatory Visit (HOSPITAL_COMMUNITY)
Admission: RE | Admit: 2016-07-23 | Discharge: 2016-07-23 | Disposition: A | Discharge status: Home / Self Care | Payer: Medicare (Managed Care) | Source: Ambulatory Visit | Attending: Vascular Surgery | Admitting: Vascular Surgery

## 2016-07-23 ENCOUNTER — Encounter: Payer: Self-pay | Admitting: Vascular Surgery

## 2016-07-23 VITALS — BP 131/64 | HR 63 | Temp 96.8°F | Resp 16 | Ht 67.5 in | Wt 102.9 lb

## 2016-07-23 DIAGNOSIS — Z01818 Encounter for other preprocedural examination: Secondary | ICD-10-CM

## 2016-07-23 DIAGNOSIS — M7989 Other specified soft tissue disorders: Secondary | ICD-10-CM

## 2016-07-23 DIAGNOSIS — I890 Lymphedema, not elsewhere classified: Secondary | ICD-10-CM

## 2016-07-23 NOTE — Progress Notes (Signed)
Referring Physician: Dr Barney Drain  Patient name: [name] MRN: [id number] DOB: [birth year] Sex: female  REASON FOR CONSULT: Leg swelling  HPI: [name] is a 82 y.o. female with a several year history of swelling in her ankles. She had a prior DVT in her left leg an IVC filter placed in 2007. At that time she also had a hysterectomy for cancer and lymphadenectomy. She states that the left leg has been swollen ever since then. It may be slightly worse over the last 2 years. 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 She still has some weakness on this side. She is on chronic Coumadin for the prior DVT. She has not 1 compression stockings in the past. She also states is sometimes the leg feels cool and it swells.  Past Medical History:  Diagnosis Date  . Anorexia   . Anxiety   . Blood transfusion without reported diagnosis    1970's  . Chronic diarrhea   . Clotting disorder ([place])   . Depression   . DVT (deep venous thrombosis) ([place])   . Endometrial ca ([place])   . Hiatal hernia   . History of D&C   . History of sinus surgery   . Hypokalemia   . Insomnia   . Multiple thyroid nodules 12/07   b/l nodules thyroid   . Neuropathy   . Pulmonary embolus ([place])   . Radiation Jan.3-Feb.6,2008   External Beam to pelvis  . Radiation QXI.50,38 and 27/2008   Intracavitary brachytherapy  . Seizure disorder [facility])    Past Surgical History:  Procedure Laterality Date  . ABDOMINAL HYSTERECTOMY  08/11/05   tah,bso,pelvic lymphadenectomy  . ankle fracture repair Left   . antroscopy     maxillary  . CHOLECYSTECTOMY    . COLONOSCOPY  2012   benign colonic polyps  . ESOPHAGOGASTRODUODENOSCOPY  2012   neg  . IVC Filter  2007   s/p DVT and P.E>  . LAPAROSCOPIC ASSISTED RADICAL VAGINAL HYSTERECTOMY W/ NODE BIOPSY  08/11/05  . LYMPHADENECTOMY    . rotator cuff repair Right     Family History  Problem Relation Age of Onset  . Stomach cancer  Sister   . Brain cancer Brother   . Lung cancer Brother   . Breast cancer Sister   . Colon cancer Neg Hx     SOCIAL HISTORY: Social History   Social History  . Marital status: Married    Spouse name: N/A  . Number of children: 5  . Years of education: N/A   Occupational History  . RETIRED, Teacher Asst/Librarian Retired   Social History Main Topics  . Smoking status: Never Smoker  . Smokeless tobacco: Never Used  . Alcohol use No  . Drug use: No  . Sexual activity: Not on file   Other Topics Concern  . Not on file   Social History Narrative  . No narrative on file    Allergies  Allergen Reactions  . Aspridrox [Aspirin Buf(Alhyd-Mghyd-Cacar)] Other (See Comments)    unknown  . Codeine Other (See Comments)    Unknown, also Tylenol with Codeine  . Darvocet [Propoxyphene N-Acetaminophen] Other (See Comments)    unknown  . Esomeprazole Magnesium Other (See Comments)    unknown  . Iodine Other (See Comments)    unknown  . Morphine And Related Other (See Comments)    unknown  . Sulfa Antibiotics Other (See Comments)  unknown  . Sulfate Other (See Comments)    unknown  . Tramadol Other (See Comments)    unknown    Current Outpatient Prescriptions  Medication Sig Dispense Refill  . acetaminophen (TYLENOL) 650 MG CR tablet Take 650 mg by mouth every 8 (eight) hours as needed for pain.    . calcium carbonate (TUMS EX) 750 MG chewable tablet Chew 1 tablet by mouth 2 (two) times daily.    . cholecalciferol (VITAMIN D) 1000 UNITS tablet Take 1,000 Units by mouth daily.    . cholestyramine (QUESTRAN) 4 g packet Take 1 packet (4 g total) by mouth daily. Take 1.5 hours away from other medications. 60 each 12  . Cyanocobalamin (VITAMIN B 12 PO) Take 1 tablet by mouth daily.    Marland Kitchen loperamide (IMODIUM) 2 MG capsule Take 1 mg by mouth as needed for diarrhea or loose stools.     Marland Kitchen LORazepam (ATIVAN) 1 MG tablet Take 1 tablet by mouth in the morning, 1/2 tablet in the  afternoon and 1/2  Tab at betime    . mirtazapine (REMERON) 15 MG tablet Take 15 mg by mouth at bedtime.    . Nutritional Supplements (BOOST BREEZE) LIQD Take 230 mLs by mouth daily.    . Nutritional Supplements (BOOST PUDDING PO) Take 1 Container by mouth daily.    . Omeprazole (PRILOSEC PO) Take by mouth daily.    Marland Kitchen warfarin (COUMADIN) 5 MG tablet Take 2.5-5 mg by mouth as directed.      No current facility-administered medications for this visit.     ROS:   General:  No weight loss, Fever, chills  HEENT: No recent headaches, no nasal bleeding, no visual changes, no sore throat  Neurologic: No dizziness, blackouts, seizures. No recent symptoms of stroke or mini- stroke. No recent episodes of slurred speech, or temporary blindness.  Cardiac: No recent episodes of chest pain/pressure, no shortness of breath at rest.  + shortness of breath with exertion.  Denies history of atrial fibrillation or irregular heartbeat  Vascular: No history of rest pain in feet.  No history of claudication.  No history of non-healing ulcer, + history of DVT   Pulmonary: No home oxygen, no productive cough, no hemoptysis,  No asthma or wheezing  Musculoskeletal:  [X]  Arthritis, [ ]  Low back pain,  [X]  Joint pain  Hematologic:No history of hypercoagulable state.  No history of easy bleeding.  No history of anemia  Gastrointestinal: No hematochezia or melena,  No gastroesophageal reflux, no trouble swallowing  Urinary: [ ]  chronic Kidney disease, [ ]  on HD - [ ]  MWF or [ ]  TTHS, [ ]  Burning with urination, [ ]  Frequent urination, [ ]  Difficulty urinating;   Skin: No rashes  Psychological: No history of anxiety,  No history of depression   Physical Examination  Vitals:   07/23/16 1119  BP: 131/64  Pulse: 63  Resp: 16  Temp: (!) 96.8 F (36 C)  TempSrc: Oral  SpO2: 98%  Weight: 102 lb 14.4 oz (46.7 kg)  Height: 5' 7.5" (1.715 m)    Body mass index is 15.88 kg/m.  General:  Alert and  oriented, no acute distress HEENT: Normal Neck: No bruit or JVD Pulmonary: Clear to auscultation bilaterally Cardiac: Regular Rate and Rhythm without murmur Abdomen: Soft, non-tender, non-distended, no mass Skin: No rash.No ulcer Extremity Pulses:  2+ radial, brachial, femoral, dorsalis pedis, posterior tibial pulses bilaterally Musculoskeletal: No deformity left lower extremity edema approximately 25% larger than the right  Neurologic: Upper and lower extremity motor 5/5 and symmetric  DATA:  Vision had a venous duplex of her left lower extremity today which showed no evidence of DVT. She did have reflux in the left common femoral and popliteal vein as well as the left greater saphenous vein. Left greater saphenous vein diameter was 3 mm.  ASSESSMENT:  Patient with chronic leg swelling left leg. This is most likely multifactorial with prior lymphadenectomy as well as some element of reflux in the left lower extremity.   PLAN: I discussed with the patient today that she should be wearing a left lower extremity compression garment if she can get this on. This will give her some symptomatic relief. We will obtain a CT scan of the abdomen and pelvis to make sure that she has no evidence of lymphatic obstruction from recurrent malignancy. Otherwise mainstay of therapy for her is going to be long-term compression therapy. I will see the patient back in follow-up after her CT scan.  Ruta Hinds, MD [facility] Office: [phone] Pager: [phone]

## 2016-07-24 NOTE — Addendum Note (Signed)
Addended by: Lianne Cure A on: 07/24/2016 03:13 PM   Modules accepted: Orders

## 2016-07-27 ENCOUNTER — Telehealth: Payer: Self-pay | Admitting: Vascular Surgery

## 2016-07-27 NOTE — Telephone Encounter (Signed)
Per instructions from Dr.Fields on 07/23/16 I scheduled the patient for a CTA abd/pelvis for 08/27/16 at 10:30am at the [facility]. She is to arrive at 10am with no solid foods 4 hours prior. Liquids and medications are fine. She is to see CEF on the same day at 12:30pm here at our office. I spoke to the patient and she is aware of the above information and I also mailed her the paperwork as well. I also spoke w/ Davy Pique at [facility] [phone] to arrange transportation and authorization for the CTA. awt

## 2016-08-18 ENCOUNTER — Encounter: Payer: Self-pay | Admitting: Vascular Surgery

## 2016-08-26 ENCOUNTER — Telehealth: Payer: Self-pay | Admitting: *Deleted

## 2016-08-26 NOTE — Telephone Encounter (Signed)
Per Romie Minus at [facility] patient needs protocol prescription called in for contrast drug allergy prior to CT.  Called to [facility]

## 2016-08-26 NOTE — Telephone Encounter (Signed)
Romie Minus from [facility] called stating that patient states that she was very sick at the time of her last CT and does not know if she has an allergy to contrast dye.  She is going to call emergency #'s and inquire with son and sister. Romie Minus states that it is likely that CT will be done at the hospital and that [place] appointment will probably be cancelled because getting one scheduled at the hospital would take more notice than today.  Romie Minus just called back stating that sister is uncertain so Romie Minus will schedule CT to be sone at hospital with a premed.  I called patient to inform her.

## 2016-08-26 NOTE — Addendum Note (Signed)
Addended by: Lianne Cure A on: 08/26/2016 04:39 PM   Modules accepted: Orders

## 2016-08-27 ENCOUNTER — Telehealth: Payer: Self-pay | Admitting: Vascular Surgery

## 2016-08-27 ENCOUNTER — Inpatient Hospital Stay: Admission: RE | Admit: 2016-08-27 | Payer: Medicare (Managed Care) | Source: Ambulatory Visit

## 2016-08-27 ENCOUNTER — Ambulatory Visit: Payer: Medicare (Managed Care) | Admitting: Vascular Surgery

## 2016-08-27 NOTE — Telephone Encounter (Signed)
-----   Message from Elam Dutch, MD sent at 08/26/2016  3:16 PM EDT ----- Regarding: RE: Question about CTA She needs CT scan of abdomen and pelvis with oral and IV contrast.  Not CTA.  She had a CT scan with contrast in 2012 and no mention of contrast reaction.  If she is nervous about this have Arbie Cookey or Zigmund Daniel give her premeds and then do CT.  Other option is pt can  Cancel CT if she does not want to pursue it but really only way to get info we need would be with contrast.  Agree no need for office visit if no CT  Ruta Hinds ----- Message ----- From: Lujean Amel Sent: 08/26/2016   2:47 PM To: Elam Dutch, MD Subject: Question about CTA                             Dr.Fields, Your patient was scheduled for a CTA abd/pelvis today at [place]. And to see you for follow up tomorrow. However, when she arrived at [facility] she stated that she has had a possible allergic reaction to the contrast they use for the CTA's so they cancelled her appointment there. And I cancelled the appointment with you for tomorrow as well. What do want to do at this point? Would you prefer her to have a CT without contrast?  Please advise and I will reschedule her appointments. Anne Ng

## 2016-08-27 NOTE — Addendum Note (Signed)
Addended by: Lianne Cure A on: 08/27/2016 12:00 PM   Modules accepted: Orders

## 2016-08-28 ENCOUNTER — Telehealth: Payer: Self-pay | Admitting: Vascular Surgery

## 2016-08-28 NOTE — Telephone Encounter (Signed)
I spoke w/ Davy Pique at [facility] regarding rescheduling this patient's appointment for CT per Dr.Fields instructions.  I also faxed the information, instructions and prescription to [facility] at [phone]. The CT is scheduled for 09/09/16 at 11:30am. The patient is to arrive at 11:15am at [facility]. [phone] NPO 4 hours prior with oral contrast at 9:30am and again at 10:30am. The patient needs to pick up oral contrast at [place] [facility] a few days prior and also will need to take pre-med Rx as instructed prior to CT scan. She is scheduled to see Dr.Fields on 10/08/16 at 11:30am. I was unable to speak to the patient regarding these changes in her appt but Davy Pique assured me that [name] would have a nurse go to her home and review these instructions prior to her appointment. awt

## 2016-08-28 NOTE — Telephone Encounter (Signed)
-----   Message from Elam Dutch, MD sent at 08/26/2016  3:16 PM EDT ----- Regarding: RE: Question about CTA She needs CT scan of abdomen and pelvis with oral and IV contrast.  Not CTA.  She had a CT scan with contrast in 2012 and no mention of contrast reaction.  If she is nervous about this have Arbie Cookey or Zigmund Daniel give her premeds and then do CT.  Other option is pt can  Cancel CT if she does not want to pursue it but really only way to get info we need would be with contrast.  Agree no need for office visit if no CT  Ruta Hinds ----- Message ----- From: Lujean Amel Sent: 08/26/2016   2:47 PM To: Elam Dutch, MD Subject: Question about CTA                             Dr.Fields, Your patient was scheduled for a CTA abd/pelvis today at [facility]. And to see you for follow up tomorrow. However, when she arrived at [facility] she stated that she has had a possible allergic reaction to the contrast they use for the CTA's so they cancelled her appointment there. And I cancelled the appointment with you for tomorrow as well. What do want to do at this point? Would you prefer her to have a CT without contrast?  Please advise and I will reschedule her appointments. Anne Ng

## 2016-09-01 NOTE — Addendum Note (Signed)
Addended by: Lianne Cure A on: 09/01/2016 03:54 PM   Modules accepted: Orders

## 2016-09-09 ENCOUNTER — Ambulatory Visit (HOSPITAL_COMMUNITY)
Admission: RE | Admit: 2016-09-09 | Discharge: 2016-09-09 | Disposition: A | Discharge status: Home / Self Care | Payer: Medicare (Managed Care) | Source: Ambulatory Visit | Attending: Vascular Surgery | Admitting: Vascular Surgery

## 2016-09-09 DIAGNOSIS — I700 Atherosclerosis of aorta: Secondary | ICD-10-CM | POA: Diagnosis not present

## 2016-09-09 DIAGNOSIS — M7989 Other specified soft tissue disorders: Secondary | ICD-10-CM | POA: Diagnosis present

## 2016-09-09 DIAGNOSIS — Z90710 Acquired absence of both cervix and uterus: Secondary | ICD-10-CM | POA: Insufficient documentation

## 2016-09-09 DIAGNOSIS — D71 Functional disorders of polymorphonuclear neutrophils: Secondary | ICD-10-CM | POA: Insufficient documentation

## 2016-09-09 DIAGNOSIS — Z9049 Acquired absence of other specified parts of digestive tract: Secondary | ICD-10-CM | POA: Diagnosis not present

## 2016-09-09 LAB — POCT I-STAT CREATININE: CREATININE: 1.2 mg/dL — AB (ref 0.44–1.00)

## 2016-09-09 MED ORDER — IOPAMIDOL (ISOVUE-300) INJECTION 61%
100.0000 mL | Freq: Once | INTRAVENOUS | Status: AC | PRN
  Administered 2016-09-09: 80 mL via INTRAVENOUS

## 2016-10-08 ENCOUNTER — Encounter: Payer: Self-pay | Admitting: Vascular Surgery

## 2016-10-08 ENCOUNTER — Ambulatory Visit (INDEPENDENT_AMBULATORY_CARE_PROVIDER_SITE_OTHER): Payer: Medicare (Managed Care) | Admitting: Vascular Surgery

## 2016-10-08 VITALS — BP 92/57 | HR 79 | Resp 16 | Ht 64.5 in | Wt 105.7 lb

## 2016-10-08 DIAGNOSIS — M7989 Other specified soft tissue disorders: Secondary | ICD-10-CM

## 2016-10-08 NOTE — Progress Notes (Signed)
Patient is an 82 year old female who was previously seen in July of this year for chronic left leg swelling. She has a several year history of swelling primarily in the ankles but extending up to the thigh. She does have a prior history of a DVT in her left leg and had an inferior vena cava filter placed in 2007. She also presented a hysterectomy and lymphadenectomy for cancer in the past. She returns today for further evaluation after recent CT scan to look for a source of her leg swelling. She is on chronic Coumadin for prior DVT. She states that her legs have improved somewhat since wearing compression stockings since her last visit.  Review of systems: She denies any chest pain or shortness of breath.  Physical exam:  Vitals:   10/08/16 1124  BP: (!) 92/57  Pulse: 79  Resp: 16  SpO2: 97%  Weight: 105 lb 11.2 oz (47.9 kg)  Height: 5' 4.5" (1.638 m)    Musculoskeletal: Left lower extremity is similar to that in July 2018. It is approximately 25% larger than the right leg. It is nonpitting.  Data: Patient had a CT scan of the abdomen and pelvis to tumor fifth 2018. I reviewed these images today. There is no mass effect on the iliac or visualized veins. There is an IVC filter in place. This is patent. There is no evidence of recurrent malignancy.  Assessment: Chronically swollen left leg. Probably multifactorial with previous lymphadenectomy prior DVT prior stroke which affected her left side.  Plan: Long-term plan will be to wear compression stockings for symptomatic relief. No intervention planned at this point. The patient will follow-up on as-needed basis.  Ruta Hinds, MD [facility] Office: [phone] Pager: [phone]

## 2017-06-09 ENCOUNTER — Ambulatory Visit
Admission: RE | Admit: 2017-06-09 | Discharge: 2017-06-09 | Disposition: A | Discharge status: Home / Self Care | Payer: Medicare (Managed Care) | Source: Ambulatory Visit | Attending: Nurse Practitioner | Admitting: Nurse Practitioner

## 2017-06-09 ENCOUNTER — Other Ambulatory Visit: Payer: Self-pay | Admitting: Nurse Practitioner

## 2017-06-09 DIAGNOSIS — S99922A Unspecified injury of left foot, initial encounter: Secondary | ICD-10-CM

## 2017-12-25 ENCOUNTER — Encounter: Payer: Self-pay | Admitting: Gastroenterology

## 2018-03-14 ENCOUNTER — Ambulatory Visit
Admission: RE | Admit: 2018-03-14 | Discharge: 2018-03-14 | Disposition: A | Discharge status: Home / Self Care | Payer: No Typology Code available for payment source | Source: Ambulatory Visit | Attending: Nurse Practitioner | Admitting: Nurse Practitioner

## 2018-03-14 ENCOUNTER — Other Ambulatory Visit: Payer: Self-pay | Admitting: Nurse Practitioner

## 2018-03-14 DIAGNOSIS — R109 Unspecified abdominal pain: Secondary | ICD-10-CM

## 2018-03-14 DIAGNOSIS — W19XXXA Unspecified fall, initial encounter: Secondary | ICD-10-CM

## 2018-04-06 ENCOUNTER — Other Ambulatory Visit: Payer: Self-pay

## 2018-04-06 ENCOUNTER — Emergency Department (HOSPITAL_COMMUNITY): Payer: Medicare (Managed Care)

## 2018-04-06 ENCOUNTER — Inpatient Hospital Stay (HOSPITAL_COMMUNITY): Payer: Medicare (Managed Care)

## 2018-04-06 ENCOUNTER — Encounter (HOSPITAL_COMMUNITY): Payer: Self-pay | Admitting: Emergency Medicine

## 2018-04-06 ENCOUNTER — Inpatient Hospital Stay (HOSPITAL_COMMUNITY)
Admission: EM | Admit: 2018-04-06 | Discharge: 2018-04-07 | DRG: 057 | Disposition: A | Discharge status: Home Health | Payer: Medicare (Managed Care) | Attending: Internal Medicine | Admitting: Internal Medicine

## 2018-04-06 DIAGNOSIS — Z8542 Personal history of malignant neoplasm of other parts of uterus: Secondary | ICD-10-CM

## 2018-04-06 DIAGNOSIS — R296 Repeated falls: Secondary | ICD-10-CM | POA: Diagnosis present

## 2018-04-06 DIAGNOSIS — Z923 Personal history of irradiation: Secondary | ICD-10-CM | POA: Diagnosis not present

## 2018-04-06 DIAGNOSIS — I69354 Hemiplegia and hemiparesis following cerebral infarction affecting left non-dominant side: Secondary | ICD-10-CM | POA: Diagnosis not present

## 2018-04-06 DIAGNOSIS — Z86711 Personal history of pulmonary embolism: Secondary | ICD-10-CM | POA: Diagnosis not present

## 2018-04-06 DIAGNOSIS — Z7901 Long term (current) use of anticoagulants: Secondary | ICD-10-CM

## 2018-04-06 DIAGNOSIS — M6250 Muscle wasting and atrophy, not elsewhere classified, unspecified site: Secondary | ICD-10-CM | POA: Diagnosis present

## 2018-04-06 DIAGNOSIS — K580 Irritable bowel syndrome with diarrhea: Secondary | ICD-10-CM | POA: Diagnosis present

## 2018-04-06 DIAGNOSIS — G20 Parkinson's disease: Secondary | ICD-10-CM | POA: Diagnosis present

## 2018-04-06 DIAGNOSIS — K219 Gastro-esophageal reflux disease without esophagitis: Secondary | ICD-10-CM | POA: Diagnosis present

## 2018-04-06 DIAGNOSIS — D689 Coagulation defect, unspecified: Secondary | ICD-10-CM | POA: Diagnosis present

## 2018-04-06 DIAGNOSIS — F329 Major depressive disorder, single episode, unspecified: Secondary | ICD-10-CM | POA: Diagnosis present

## 2018-04-06 DIAGNOSIS — Z91048 Other nonmedicinal substance allergy status: Secondary | ICD-10-CM

## 2018-04-06 DIAGNOSIS — Z79899 Other long term (current) drug therapy: Secondary | ICD-10-CM | POA: Diagnosis not present

## 2018-04-06 DIAGNOSIS — G4700 Insomnia, unspecified: Secondary | ICD-10-CM | POA: Diagnosis present

## 2018-04-06 DIAGNOSIS — I361 Nonrheumatic tricuspid (valve) insufficiency: Secondary | ICD-10-CM | POA: Diagnosis not present

## 2018-04-06 DIAGNOSIS — F419 Anxiety disorder, unspecified: Secondary | ICD-10-CM | POA: Diagnosis present

## 2018-04-06 DIAGNOSIS — G40909 Epilepsy, unspecified, not intractable, without status epilepticus: Secondary | ICD-10-CM | POA: Diagnosis present

## 2018-04-06 DIAGNOSIS — Z86010 Personal history of colonic polyps: Secondary | ICD-10-CM

## 2018-04-06 DIAGNOSIS — Z882 Allergy status to sulfonamides status: Secondary | ICD-10-CM

## 2018-04-06 DIAGNOSIS — Z86718 Personal history of other venous thrombosis and embolism: Secondary | ICD-10-CM

## 2018-04-06 DIAGNOSIS — R471 Dysarthria and anarthria: Secondary | ICD-10-CM | POA: Diagnosis present

## 2018-04-06 DIAGNOSIS — F0280 Dementia in other diseases classified elsewhere without behavioral disturbance: Secondary | ICD-10-CM | POA: Diagnosis present

## 2018-04-06 DIAGNOSIS — Z888 Allergy status to other drugs, medicaments and biological substances status: Secondary | ICD-10-CM

## 2018-04-06 DIAGNOSIS — M7989 Other specified soft tissue disorders: Secondary | ICD-10-CM | POA: Diagnosis not present

## 2018-04-06 DIAGNOSIS — R410 Disorientation, unspecified: Secondary | ICD-10-CM | POA: Diagnosis present

## 2018-04-06 DIAGNOSIS — I340 Nonrheumatic mitral (valve) insufficiency: Secondary | ICD-10-CM | POA: Diagnosis not present

## 2018-04-06 DIAGNOSIS — Z95828 Presence of other vascular implants and grafts: Secondary | ICD-10-CM | POA: Diagnosis not present

## 2018-04-06 DIAGNOSIS — R4701 Aphasia: Secondary | ICD-10-CM | POA: Diagnosis present

## 2018-04-06 DIAGNOSIS — Z9181 History of falling: Secondary | ICD-10-CM | POA: Diagnosis not present

## 2018-04-06 DIAGNOSIS — Z90710 Acquired absence of both cervix and uterus: Secondary | ICD-10-CM | POA: Diagnosis not present

## 2018-04-06 DIAGNOSIS — Z885 Allergy status to narcotic agent status: Secondary | ICD-10-CM

## 2018-04-06 DIAGNOSIS — G252 Other specified forms of tremor: Secondary | ICD-10-CM | POA: Diagnosis not present

## 2018-04-06 DIAGNOSIS — Z9089 Acquired absence of other organs: Secondary | ICD-10-CM | POA: Diagnosis not present

## 2018-04-06 HISTORY — DX: Parkinson's disease without dyskinesia, without mention of fluctuations: G20.A1

## 2018-04-06 HISTORY — DX: Parkinson's disease: G20

## 2018-04-06 LAB — COMPREHENSIVE METABOLIC PANEL
ALT: 15 U/L (ref 0–44)
AST: 25 U/L (ref 15–41)
Albumin: 3.2 g/dL — ABNORMAL LOW (ref 3.5–5.0)
Alkaline Phosphatase: 67 U/L (ref 38–126)
Anion gap: 9 (ref 5–15)
BUN: 13 mg/dL (ref 8–23)
CO2: 24 mmol/L (ref 22–32)
Calcium: 9.5 mg/dL (ref 8.9–10.3)
Chloride: 106 mmol/L (ref 98–111)
Creatinine, Ser: 1.05 mg/dL — ABNORMAL HIGH (ref 0.44–1.00)
GFR calc Af Amer: 58 mL/min — ABNORMAL LOW (ref >60)
GFR calc non Af Amer: 50 mL/min — ABNORMAL LOW (ref >60)
Glucose, Bld: 88 mg/dL (ref 70–99)
Potassium: 3.9 mmol/L (ref 3.5–5.1)
Sodium: 139 mmol/L (ref 135–145)
Total Bilirubin: 0.9 mg/dL (ref 0.3–1.2)
Total Protein: 6 g/dL — ABNORMAL LOW (ref 6.5–8.1)

## 2018-04-06 LAB — URINALYSIS, ROUTINE W REFLEX MICROSCOPIC
Bilirubin Urine: NEGATIVE
Glucose, UA: NEGATIVE mg/dL
Hgb urine dipstick: NEGATIVE
Ketones, ur: NEGATIVE mg/dL
Leukocytes,Ua: NEGATIVE
Nitrite: NEGATIVE
Protein, ur: NEGATIVE mg/dL
Specific Gravity, Urine: 1.013 (ref 1.005–1.030)
pH: 5 (ref 5.0–8.0)

## 2018-04-06 LAB — CBC WITH DIFFERENTIAL/PLATELET
Abs Immature Granulocytes: 0.02 10*3/uL (ref 0.00–0.07)
Basophils Absolute: 0 10*3/uL (ref 0.0–0.1)
Basophils Relative: 0 %
Eosinophils Absolute: 0 10*3/uL (ref 0.0–0.5)
Eosinophils Relative: 0 %
HCT: 34.5 % — ABNORMAL LOW (ref 36.0–46.0)
Hemoglobin: 10.3 g/dL — ABNORMAL LOW (ref 12.0–15.0)
Immature Granulocytes: 0 %
Lymphocytes Relative: 27 %
Lymphs Abs: 1.3 10*3/uL (ref 0.7–4.0)
MCH: 28.9 pg (ref 26.0–34.0)
MCHC: 29.9 g/dL — ABNORMAL LOW (ref 30.0–36.0)
MCV: 96.9 fL (ref 80.0–100.0)
Monocytes Absolute: 0.4 10*3/uL (ref 0.1–1.0)
Monocytes Relative: 8 %
Neutro Abs: 3.2 10*3/uL (ref 1.7–7.7)
Neutrophils Relative %: 65 %
Platelets: 200 10*3/uL (ref 150–400)
RBC: 3.56 MIL/uL — ABNORMAL LOW (ref 3.87–5.11)
RDW: 12.2 % (ref 11.5–15.5)
WBC: 5 10*3/uL (ref 4.0–10.5)
nRBC: 0 % (ref 0.0–0.2)

## 2018-04-06 LAB — CBG MONITORING, ED: Glucose-Capillary: 70 mg/dL (ref 70–99)

## 2018-04-06 LAB — PROTIME-INR
INR: 2.8 — ABNORMAL HIGH (ref 0.8–1.2)
Prothrombin Time: 28.9 seconds — ABNORMAL HIGH (ref 11.4–15.2)

## 2018-04-06 MED ORDER — LORAZEPAM 0.5 MG PO TABS: 0.5000 mg | ORAL_TABLET | Freq: Two times a day (BID) | ORAL | Status: DC | PRN

## 2018-04-06 MED ORDER — LOPERAMIDE HCL 2 MG PO CAPS: 2.0000 mg | ORAL_CAPSULE | ORAL | Status: DC | PRN

## 2018-04-06 MED ORDER — PANTOPRAZOLE SODIUM 40 MG PO TBEC
40.0000 mg | DELAYED_RELEASE_TABLET | Freq: Every day | ORAL | Status: DC
  Administered 2018-04-07: 40 mg via ORAL
  Filled 2018-04-06: qty 1

## 2018-04-06 MED ORDER — VITAMIN D 25 MCG (1000 UNIT) PO TABS
1000.0000 [IU] | ORAL_TABLET | Freq: Every day | ORAL | Status: DC
  Administered 2018-04-07: 12:00:00 1000 [IU] via ORAL
  Filled 2018-04-06: qty 1

## 2018-04-06 MED ORDER — MIRTAZAPINE 15 MG PO TABS: 15.0000 mg | ORAL_TABLET | Freq: Every evening | ORAL | Status: DC | PRN

## 2018-04-06 NOTE — ED Notes (Signed)
Pt removed her iv. New IV started. Pt going to MRI.

## 2018-04-06 NOTE — ED Triage Notes (Signed)
[facility]- [facility]. Pt here for confusion, dysphagia and fever. Pt has not been around anyone who has been sick with cough, fever, or SOB. Pt has no cough or upper respiratory symptoms.   102.2 temp 038 palpated systolic 882 CBG Pt complains of no pain.

## 2018-04-06 NOTE — ED Notes (Signed)
Patient transported to CT 

## 2018-04-06 NOTE — ED Notes (Signed)
ED TO INPATIENT HANDOFF REPORT  ED Nurse Name and Phone #: Abel Hageman [phone]  S Name/Age/Gender [name] 84 y.o. female Room/Bed: 024C/024C  Code Status   Code Status: Not on file  Home/SNF/Other Home Patient oriented to: self and place Is this baseline? No   Triage Complete: Triage complete  Chief Complaint Confusion fever  Triage Note [facility]- [facility]. Pt here for confusion, dysphagia and fever. Pt has not been around anyone who has been sick with cough, fever, or SOB. Pt has no cough or upper respiratory symptoms.   102.2 temp 299 palpated systolic 371 CBG Pt complains of no pain.   Allergies Allergies  Allergen Reactions  . Aspridrox [Aspirin Buf(Alhyd-Mghyd-Cacar)] Other (See Comments)    unknown  . Codeine Other (See Comments)    Unknown, also Tylenol with Codeine  . Darvocet [Propoxyphene N-Acetaminophen] Other (See Comments)    unknown  . Esomeprazole Magnesium Other (See Comments)    unknown  . Iodine Other (See Comments)    unknown  . Morphine And Related Other (See Comments)    unknown  . Sulfa Antibiotics Other (See Comments)    unknown  . Sulfate Other (See Comments)    unknown  . Tramadol Other (See Comments)    unknown    Level of Care/Admitting Diagnosis ED Disposition    ED Disposition Condition [facility]: [facility] [[id number]]  Level of Care: Telemetry Medical [104]  Diagnosis: AMS (altered mental status) [[id number]]  Admitting Physician: Aldine Contes [phone]  Attending Physician: Aldine Contes [phone]  Estimated length of stay: past midnight tomorrow  Certification:: I certify this patient will need inpatient services for at least 2 midnights  PT Class (Do Not Modify): Inpatient [101]  PT Acc Code (Do Not Modify): Private [1]       B Medical/Surgery History Past Medical History:  Diagnosis Date  . Anorexia   . Anxiety   . Blood transfusion without reported  diagnosis    1970's  . Chronic diarrhea   . Clotting disorder ([place])   . Depression   . DVT (deep venous thrombosis) ([place])   . Endometrial ca ([place])   . Hiatal hernia   . History of D&C   . History of sinus surgery   . Hypokalemia   . Insomnia   . Multiple thyroid nodules 12/07   b/l nodules thyroid   . Neuropathy   . Pulmonary embolus ([place])   . Radiation Jan.3-Feb.6,2008   External Beam to pelvis  . Radiation POE.42,35 and 27/2008   Intracavitary brachytherapy  . Seizure disorder [facility])    Past Surgical History:  Procedure Laterality Date  . ABDOMINAL HYSTERECTOMY  08/11/05   tah,bso,pelvic lymphadenectomy  . ankle fracture repair Left   . antroscopy     maxillary  . CHOLECYSTECTOMY    . COLONOSCOPY  2012   benign colonic polyps  . ESOPHAGOGASTRODUODENOSCOPY  2012   neg  . IVC Filter  2007   s/p DVT and P.E>  . LAPAROSCOPIC ASSISTED RADICAL VAGINAL HYSTERECTOMY W/ NODE BIOPSY  08/11/05  . LYMPHADENECTOMY    . rotator cuff repair Right      A IV Location/Drains/Wounds Patient Lines/Drains/Airways Status   Active Line/Drains/Airways    Name:   Placement date:   Placement time:   Site:   Days:   Peripheral IV 04/06/18 Left Forearm   04/06/18    1635    Forearm   less than  1          Intake/Output Last 24 hours No intake or output data in the 24 hours ending 04/06/18 2218  Labs/Imaging Results for orders placed or performed during the hospital encounter of 04/06/18 (from the past 48 hour(s))  CBG monitoring, ED     Status: None   Collection Time: 04/06/18  4:55 PM  Result Value Ref Range   Glucose-Capillary 70 70 - 99 mg/dL   Comment 1 Notify RN    Comment 2 Document in Chart   Urinalysis, Routine w reflex microscopic     Status: None   Collection Time: 04/06/18  5:12 PM  Result Value Ref Range   Color, Urine YELLOW YELLOW   APPearance CLEAR CLEAR   Specific Gravity, Urine 1.013 1.005 - 1.030   pH 5.0 5.0 - 8.0   Glucose, UA NEGATIVE NEGATIVE mg/dL   Hgb  urine dipstick NEGATIVE NEGATIVE   Bilirubin Urine NEGATIVE NEGATIVE   Ketones, ur NEGATIVE NEGATIVE mg/dL   Protein, ur NEGATIVE NEGATIVE mg/dL   Nitrite NEGATIVE NEGATIVE   Leukocytes,Ua NEGATIVE NEGATIVE    Comment: Performed at [facility], [address] [place], [place] [address]  CBC with Differential/Platelet     Status: Abnormal   Collection Time: 04/06/18  5:16 PM  Result Value Ref Range   WBC 5.0 4.0 - 10.5 K/uL   RBC 3.56 (L) 3.87 - 5.11 MIL/uL   Hemoglobin 10.3 (L) 12.0 - 15.0 g/dL   HCT 34.5 (L) 36.0 - 46.0 %   MCV 96.9 80.0 - 100.0 fL   MCH 28.9 26.0 - 34.0 pg   MCHC 29.9 (L) 30.0 - 36.0 g/dL   RDW 12.2 11.5 - 15.5 %   Platelets 200 150 - 400 K/uL   nRBC 0.0 0.0 - 0.2 %   Neutrophils Relative % 65 %   Neutro Abs 3.2 1.7 - 7.7 K/uL   Lymphocytes Relative 27 %   Lymphs Abs 1.3 0.7 - 4.0 K/uL   Monocytes Relative 8 %   Monocytes Absolute 0.4 0.1 - 1.0 K/uL   Eosinophils Relative 0 %   Eosinophils Absolute 0.0 0.0 - 0.5 K/uL   Basophils Relative 0 %   Basophils Absolute 0.0 0.0 - 0.1 K/uL   Immature Granulocytes 0 %   Abs Immature Granulocytes 0.02 0.00 - 0.07 K/uL    Comment: Performed at [facility], [address], [place], [place] [address]  Comprehensive metabolic panel     Status: Abnormal   Collection Time: 04/06/18  5:16 PM  Result Value Ref Range   Sodium 139 135 - 145 mmol/L   Potassium 3.9 3.5 - 5.1 mmol/L   Chloride 106 98 - 111 mmol/L   CO2 24 22 - 32 mmol/L   Glucose, Bld 88 70 - 99 mg/dL   BUN 13 8 - 23 mg/dL   Creatinine, Ser 1.05 (H) 0.44 - 1.00 mg/dL   Calcium 9.5 8.9 - 10.3 mg/dL   Total Protein 6.0 (L) 6.5 - 8.1 g/dL   Albumin 3.2 (L) 3.5 - 5.0 g/dL   AST 25 15 - 41 U/L   ALT 15 0 - 44 U/L   Alkaline Phosphatase 67 38 - 126 U/L   Total Bilirubin 0.9 0.3 - 1.2 mg/dL   GFR calc non Af Amer 50 (L) >60 mL/min   GFR calc Af Amer 58 (L) >60 mL/min   Anion gap 9 5 - 15    Comment: Performed at [facility], [place]  [address] [place], [place] [address]  Protime-INR     Status: Abnormal   Collection Time: 04/06/18  5:16 PM  Result Value Ref Range   Prothrombin Time 28.9 (H) 11.4 - 15.2 seconds   INR 2.8 (H) 0.8 - 1.2    Comment: (NOTE) INR goal varies based on device and disease states. Performed at [facility], [place] [address], [place], [place] [address]    Ct Head Wo Contrast  Result Date: 04/06/2018 CLINICAL DATA:  Confusion, dysphagia and fever. EXAM: CT HEAD WITHOUT CONTRAST TECHNIQUE: Contiguous axial images were obtained from the base of the skull through the vertex without intravenous contrast. COMPARISON:  12/31/2012. FINDINGS: Brain: No evidence for acute infarction, hemorrhage, mass lesion, hydrocephalus, or extra-axial fluid. Generalized atrophy. Chronic microvascular ischemic change. Remote chronic RIGHT cerebellar infarct. Vascular: Calcification of the cavernous internal carotid arteries consistent with cerebrovascular atherosclerotic disease. No signs of intracranial large vessel occlusion. Skull: Calvarium intact. Sinuses/Orbits: No acute finding. Other: None. IMPRESSION: Atrophy and small vessel disease. No acute intracranial findings. Electronically Signed   By: Staci Righter M.D.   On: 04/06/2018 19:23   Dg Chest Portable 1 View  Result Date: 04/06/2018 CLINICAL DATA:  Fever. EXAM: PORTABLE CHEST 1 VIEW COMPARISON:  Radiographs of March 14, 2018. FINDINGS: The heart size and mediastinal contours are within normal limits. Both lungs are clear. The visualized skeletal structures are unremarkable. IMPRESSION: No active disease. Electronically Signed   By: Marijo Conception, M.D.   On: 04/06/2018 17:35    Pending Labs Unresulted Labs (From admission, onward)    Start     Ordered   04/06/18 1808  Urine rapid drug screen (hosp performed)  ONCE - STAT,   STAT     04/06/18 1807   04/06/18 1712  Urine culture  ONCE - STAT,   STAT     04/06/18 1712   Signed and Held  Basic metabolic panel   Tomorrow morning,   R     Signed and Held   Signed and Held  CBC  Tomorrow morning,   R     Signed and Held   Signed and Held  Protime-INR  Tomorrow morning,   R     Signed and Held          Vitals/Pain Today's Vitals   04/06/18 2000 04/06/18 2015 04/06/18 2045 04/06/18 2213  BP: (!) 150/94 (!) 151/21  (!) 149/92  Pulse: 77 79 72 77  Resp: 19 17 (!) 21 18  Temp:    99.3 F (37.4 C)  TempSrc:      SpO2: 100% 100% 100% 100%  Weight:      Height:      PainSc:    0-No pain    Isolation Precautions No active isolations  Medications Medications - No data to display  Mobility walks with person assist High fall risk   Focused Assessments Neuro Assessment Handoff:  Swallow screen pass? Yes    NIH Stroke Scale ( + Modified Stroke Scale Criteria)  Interval: Initial Level of Consciousness (1a.)   : Alert, keenly responsive LOC Questions (1b. )   +: Answers one question correctly LOC Commands (1c. )   + : Performs both tasks correctly Best Gaze (2. )  +: Normal Visual (3. )  +: No visual loss Facial Palsy (4. )    : Normal symmetrical movements Motor Arm, Left (5a. )   +: No drift Motor Arm, Right (  5b. )   +: No drift Motor Leg, Left (6a. )   +: Some effort against gravity Motor Leg, Right (6b. )   +: Drift Limb Ataxia (7. ): Absent Sensory (8. )   +: Normal, no sensory loss Best Language (9. )   +: No aphasia Dysarthria (10. ): Normal Extinction/Inattention (11.)   +: No Abnormality Modified SS Total  +: 4 Complete NIHSS TOTAL: 4     Neuro Assessment: Exceptions to WDL Neuro Checks:   Initial (04/06/18 2008)  Last Documented NIHSS Modified Score: 4 (04/06/18 2008) Has TPA been given? No If patient is a Neuro Trauma and patient is going to OR before floor call report to [facility] nurse: [phone] or [phone]     R Recommendations: See Admitting Provider Note  Report given to:   Additional Notes:

## 2018-04-06 NOTE — ED Provider Notes (Signed)
[facility] EMERGENCY DEPARTMENT Provider Note   CSN: [id number] Arrival date & time: 04/06/18  1638    History   Chief Complaint Chief complaint - fever, confusion Level 5 caveat due to altered mental status HPI [name] is a 84 y.o. female.     The history is provided by the patient and a caregiver. The history is limited by the condition of the patient.  [facility] Reported she had fever and confusion and difficulty speaking. She Is unable to provide any details of this time   I spoke to her son Richardson Landry via phone.  He reports this morning he may have heard her cough but had otherwise been at her baseline.  He spoke to her later in the day via phone, and she did appear confused and he was concerned for stroke.  He was unaware of the presence of a fever.  Past Medical History:  Diagnosis Date   Anorexia    Anxiety    Blood transfusion without reported diagnosis    1970's   Chronic diarrhea    Clotting disorder ([facility])    Depression    DVT (deep venous thrombosis) ([facility])    Endometrial ca ([place])    Hiatal hernia    History of D&C    History of sinus surgery    Hypokalemia    Insomnia    Multiple thyroid nodules 12/07   b/l nodules thyroid    Neuropathy    Pulmonary embolus ([place])    Radiation Jan.3-Feb.6,2008   External Beam to pelvis   Radiation Feb.13,20 and 27/2008   Intracavitary brachytherapy   Seizure disorder [facility])     Patient Active Problem List   Diagnosis Date Noted   Dysphagia 03/11/2016   Irritable bowel syndrome with diarrhea 03/11/2016   History of colonic polyps 05/24/2013   Malignant neoplasm of corpus uteri, except isthmus ([place]) 12/22/2010   Pulmonary embolus ([facility])    DVT (deep venous thrombosis) (H[facility])    Chronic diarrhea    Insomnia    Multiple thyroid nodules    Anorexia     Past Surgical History:  Procedure Laterality Date   ABDOMINAL HYSTERECTOMY  08/11/05    tah,bso,pelvic lymphadenectomy   ankle fracture repair Left    antroscopy     maxillary   CHOLECYSTECTOMY     COLONOSCOPY  2012   benign colonic polyps   ESOPHAGOGASTRODUODENOSCOPY  2012   neg   IVC Filter  2007   s/p DVT and P.E>   LAPAROSCOPIC ASSISTED RADICAL VAGINAL HYSTERECTOMY W/ NODE BIOPSY  08/11/05   LYMPHADENECTOMY     rotator cuff repair Right      OB History    Gravida  5   Para  5   Term      Preterm      AB      Living        SAB      TAB      Ectopic      Multiple      Live Births           Obstetric Comments  Menarche 15,1st pregnancy 25,         Home Medications    Prior to Admission medications   Medication Sig Start Date End Date Taking? Authorizing Provider  acetaminophen (TYLENOL) 650 MG CR tablet Take 650 mg by mouth every 8 (eight) hours as needed for pain.    [provider]  calcium carbonate (TUMS EX) 750 MG chewable tablet Chew 1 tablet by mouth 2 (two) times daily.    [provider]  cholecalciferol (VITAMIN D) 1000 UNITS tablet Take 1,000 Units by mouth daily.    [provider]  cholestyramine (QUESTRAN) 4 g packet Take 1 packet (4 g total) by mouth daily. Take 1.5 hours away from other medications. 03/11/16   Zehr, Laban Emperor, PA-C  Cyanocobalamin (VITAMIN B 12 PO) Take 1 tablet by mouth daily.    [provider]  ENSURE PLUS (ENSURE PLUS) LIQD Take 237 mLs by mouth.    [provider]  feeding supplement, ENSURE, (ENSURE) PUDG Take 1 Container by mouth 3 (three) times daily between meals.    [provider]  loperamide (IMODIUM) 2 MG capsule Take 1 mg by mouth as needed for diarrhea or loose stools.     [provider]  LORazepam (ATIVAN) 1 MG tablet Take 1 tablet by mouth in the morning, 1/2 tablet in the afternoon and 1/2  Tab at betime    [provider]  Menthol, Topical Analgesic, (BIOFREEZE) 4 % GEL Apply 3.5 % topically 2 (two) times  daily.    [provider]  mirtazapine (REMERON) 15 MG tablet Take 15 mg by mouth at bedtime.    [provider]  Omeprazole (PRILOSEC PO) Take by mouth daily.    [provider]  warfarin (COUMADIN) 5 MG tablet Take 2.5-5 mg by mouth as directed.     [provider]    Family History Family History  Problem Relation Age of Onset   Stomach cancer Sister    Brain cancer Brother    Lung cancer Brother    Breast cancer Sister    Colon cancer Neg Hx     Social History Social History   Tobacco Use   Smoking status: Never Smoker   Smokeless tobacco: Never Used  Substance Use Topics   Alcohol use: No   Drug use: No     Allergies   Aspridrox [aspirin buf(alhyd-mghyd-cacar)]; Codeine; Darvocet [propoxyphene n-acetaminophen]; Esomeprazole magnesium; Iodine; Morphine and related; Sulfa antibiotics; Sulfate; and Tramadol   Review of Systems Review of Systems  Unable to perform ROS: Mental status change     Physical Exam Updated Vital Signs BP (!) 155/101 (BP Location: Right Arm)    Pulse 71    Temp 98.7 F (37.1 C) (Oral)    Resp (!) 21    Ht 1.702 m (5\' 7" )    Wt 50.3 kg    SpO2 100%    BMI 17.39 kg/m   Physical Exam CONSTITUTIONAL: Elderly, frail, no acute distress HEAD: Normocephalic/atraumatic EYES: EOMI ENMT: Mucous membranes moist NECK: supple no meningeal signs SPINE/BACK:entire spine nontender CV: S1/S2 noted, no murmurs/rubs/gallops noted LUNGS: Lungs are clear to auscultation bilaterally, no apparent distress ABDOMEN: soft, nontender, no rebound or guarding, bowel sounds noted throughout abdomen GU:no cva tenderness NEURO: Pt is awake/alert, appears slow to respond.  She does follow commands and moves all extremities x4 EXTREMITIES: pulses normal/equal, full ROM chronic left lower extremity edema without erythema.  No signs of trauma to extremities. SKIN: warm, color normal, small amount of skin breakdown to sacrum,  no abscess noted. No wounds noted on body.  Chaperone present for exam PSYCH: unable to assess  ED Treatments / Results  Labs (all labs ordered are listed, but only abnormal results are displayed) Labs Reviewed  CBC WITH DIFFERENTIAL/PLATELET - Abnormal; Notable for the following components:  Result Value   RBC 3.56 (*)    Hemoglobin 10.3 (*)    HCT 34.5 (*)    MCHC 29.9 (*)    All other components within normal limits  COMPREHENSIVE METABOLIC PANEL - Abnormal; Notable for the following components:   Creatinine, Ser 1.05 (*)    Total Protein 6.0 (*)    Albumin 3.2 (*)    GFR calc non Af Amer 50 (*)    GFR calc Af Amer 58 (*)    All other components within normal limits  PROTIME-INR - Abnormal; Notable for the following components:   Prothrombin Time 28.9 (*)    INR 2.8 (*)    All other components within normal limits  URINE CULTURE  URINALYSIS, ROUTINE W REFLEX MICROSCOPIC  RAPID URINE DRUG SCREEN, HOSP PERFORMED  CBG MONITORING, ED    EKG EKG Interpretation  Date/Time:  Wednesday April 06 2018 16:56:17 EDT Ventricular Rate:  73 PR Interval:    QRS Duration: 100 QT Interval:  393 QTC Calculation: 433 R Axis:   62 Text Interpretation:  Sinus rhythm Probable left ventricular hypertrophy Artifact in lead(s) I II III aVR aVL aVF Interpretation limited secondary to artifact Confirmed by Ripley Fraise [phone]) on 04/06/2018 5:09:10 PM   Radiology Ct Head Wo Contrast  Result Date: 04/06/2018 CLINICAL DATA:  Confusion, dysphagia and fever. EXAM: CT HEAD WITHOUT CONTRAST TECHNIQUE: Contiguous axial images were obtained from the base of the skull through the vertex without intravenous contrast. COMPARISON:  12/31/2012. FINDINGS: Brain: No evidence for acute infarction, hemorrhage, mass lesion, hydrocephalus, or extra-axial fluid. Generalized atrophy. Chronic microvascular ischemic change. Remote chronic RIGHT cerebellar infarct. Vascular: Calcification of the cavernous  internal carotid arteries consistent with cerebrovascular atherosclerotic disease. No signs of intracranial large vessel occlusion. Skull: Calvarium intact. Sinuses/Orbits: No acute finding. Other: None. IMPRESSION: Atrophy and small vessel disease. No acute intracranial findings. Electronically Signed   By: Staci Righter M.D.   On: 04/06/2018 19:23   Dg Chest Portable 1 View  Result Date: 04/06/2018 CLINICAL DATA:  Fever. EXAM: PORTABLE CHEST 1 VIEW COMPARISON:  Radiographs of March 14, 2018. FINDINGS: The heart size and mediastinal contours are within normal limits. Both lungs are clear. The visualized skeletal structures are unremarkable. IMPRESSION: No active disease. Electronically Signed   By: Marijo Conception, M.D.   On: 04/06/2018 17:35    Procedures Procedures    Medications Ordered in ED Medications - No data to display   Initial Impression / Assessment and Plan / ED Course  I have reviewed the triage vital signs and the nursing notes.  Pertinent labs & imaging results that were available during my care of the patient were reviewed by me and considered in my medical decision making (see chart for details).        5:32 PM Patient seen on arrival for EMS.  They report that [facility] and she had a fever and difficulty speaking and confusion. She currently has no fever, unclear if she got antipyretics. Patient is awake and alert but does appear mildly confused but no focal weakness. Son reports she did appear confused via phone but he was unaware of fever. At this point is unclear cause of her altered mental status She is not coughing Start with x-ray labs and urinalysis. This Was endorsed to son, at this point we are not testing for coronavirus and was admitted. Asked me to call his sister Darrick Penna later [phone] 6:08 PM Pt without  fever/urinalysis is negative Unclear cause of AMS Will obtain CT head tPA in stroke considered but not given due  to: Unclear time of onset, pt on coumadin  6:14 PM Discussed With daughter Darrick Penna.  She is medical power of attorney.  She reports that patient is normally ambulatory, and has clear speech.  She is actually concerned about a stroke as well.  We will proceed with CT head 9:08 PM CT head is negative. Patient still appears confused, but no gross motor deficits Unclear cause of altered mental status.  Discussed with family, and is clear she is not at her baseline.  Will admit patient for further work-up, potentially TIA/CVA evaluation No known history of seizures, could be having subclinical seizures with AMS Currently afebrile, neck is supple, low suspicion for meningoencephalitis Cough is noted, low suspicion for COVID-19 Discussed the case with internal medicine service for admission   ALEXIUS ELLINGTON was evaluated in Emergency Department on 04/06/2018 for the symptoms described in the history of present illness. She was evaluated in the context of the global COVID-19 pandemic, which necessitated consideration that the patient might be at risk for infection with the SARS-CoV-2 virus that causes COVID-19. Institutional protocols and algorithms that pertain to the evaluation of patients at risk for COVID-19 are in a state of rapid change based on information released by regulatory bodies including the CDC and federal and state organizations. These policies and algorithms were followed during the patient's care in the ED.  Final Clinical Impressions(s) / ED Diagnoses   Final diagnoses:  Delirium    ED Discharge Orders    None       Ripley Fraise, MD 04/06/18 2109

## 2018-04-06 NOTE — Progress Notes (Signed)
Pt admitted from ED with difficulty speaking, alert and oriented with slight forgetfulness but follows command, settled in bed with call light within pt's reach, tele monitor put and verified on pt, safety concern addressed,admit Doc paged to clarify diagnosis, not stroke nor TIA, will however continue to monitor, pt reassured. Obasogie-Asidi, Merland Holness Efe

## 2018-04-06 NOTE — H&P (Signed)
Date: 04/06/2018               Patient Name:  [name] MRN: [id number]  DOB: January 12, 1934 Age / Sex: 84 y.o., female   PCP: Janifer Adie, MD         Medical Service: Internal Medicine Teaching Service         Attending Physician: Dr. Aldine Contes, MD    First Contact: Dr. Donne Hazel Pager: [phone]   Dr. 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 The aphasia came on suddenly. She otherwise feels well and is not confused. Despite the aphasia, she is able to provide a good history and is alert and oriented x3. She denies headache, new vision changes, dizziness, chest pain, dyspnea, or weakness. She denies fevers or cough.     I spoke with her daughter [name], who is also healthcare power of attorney. She states that over the past 2-3 weeks she has noticed that her mother has had word finding difficulties and unintelligible speech intermittently. Yesterday, [name] sounded completely normal to [place] when they were talking on the phone. However, earlier today, she could not understand her speech over the phone. [name] states that at baseline, [name] is independent with all ADLs and is oriented enough to converse about current events. [name] does not believe her mother has experienced fevers or coughs at home. She has not left her house (where she lives with her son) in two weeks. No known sick contacts.   EMS reported fever of 102.2 during transport. It  was not documented whether or not she received antipyretics. Upon arrival to the ED, patient was afebrile, hypertensive in the [id number] systolic, and saturating well on room air. Labs significant for no leukocytosis or leukopenia, Hb 10, INR 2.8. UA unremarkable. EKG is NSR with LVH. CXR without edema or opacities. Head CT without acute findings. Brain MRI negative for acute infarct.  Meds: No current facility-administered medications on file prior to encounter.    Current Outpatient Medications on File Prior to Encounter  Medication Sig Dispense Refill  . acetaminophen (TYLENOL) 650 MG CR tablet Take 650 mg by mouth every 8 (eight) hours as needed for pain.    . calcium carbonate (TUMS EX) 750 MG chewable tablet Chew 1 tablet by mouth 2 (two) times daily.    . cholecalciferol (VITAMIN D) 1000 UNITS tablet Take 1,000 Units by mouth daily.    . cholestyramine (QUESTRAN) 4 g packet Take 1 packet (4 g total) by mouth daily. Take 1.5 hours away from other medications. 60 each 12  . Cyanocobalamin (VITAMIN B 12 PO) Take 1 tablet by mouth daily.    Marland Kitchen ENSURE PLUS (ENSURE PLUS) LIQD Take 237 mLs by mouth.    . feeding supplement, ENSURE, (ENSURE) PUDG Take 1 Container by mouth 3 (three) times daily between meals.    Marland Kitchen loperamide (IMODIUM) 2 MG capsule Take 1 mg by mouth as needed for diarrhea  or loose stools.     Marland Kitchen LORazepam (ATIVAN) 1 MG tablet Take 1 tablet by mouth in the morning, 1/2 tablet in the afternoon and 1/2  Tab at betime    . Menthol, Topical Analgesic, (BIOFREEZE) 4 % GEL Apply 3.5 % topically 2 (two) times daily.    . mirtazapine (REMERON) 15 MG tablet Take 15 mg by mouth at bedtime.    . Omeprazole (PRILOSEC PO) Take by mouth daily.    Marland Kitchen warfarin (COUMADIN) 5 MG tablet Take 2.5-5 mg by mouth as directed.      Allergies: Allergies as of 04/06/2018 - Review Complete 04/06/2018  Allergen Reaction Noted  . Aspridrox [aspirin buf(alhyd-mghyd-cacar)] Other (See Comments) 02/17/2010  .  Codeine Other (See Comments) 02/17/2010  . Darvocet [propoxyphene n-acetaminophen] Other (See Comments) 02/17/2010  . Esomeprazole magnesium Other (See Comments) 02/17/2010  . Iodine Other (See Comments) 12/12/2010  . Morphine and related Other (See Comments) 02/17/2010  . Sulfa antibiotics Other (See Comments) 02/17/2010  . Sulfate Other (See Comments) 02/17/2010  . Tramadol Other (See Comments) 12/12/2010   Past Medical History:  Diagnosis Date  . Anorexia   . Anxiety   . Blood transfusion without reported diagnosis    1970's  . Chronic diarrhea   . Clotting disorder ([place])   . Depression   . DVT (deep venous thrombosis) ([place])   . Endometrial ca ([place])   . Hiatal hernia   . History of D&C   . History of sinus surgery   . Hypokalemia   . Insomnia   . Multiple thyroid nodules 12/07   b/l nodules thyroid   . Neuropathy   . Pulmonary embolus ([place])   . Radiation Jan.3-Feb.6,2008   External Beam to pelvis  . Radiation CHY.85,02 and 27/2008   Intracavitary brachytherapy  . Seizure disorder [facility])    Past Surgical History:  Procedure Laterality Date  . ABDOMINAL HYSTERECTOMY  08/11/05   tah,bso,pelvic lymphadenectomy  . ankle fracture repair Left   . antroscopy     maxillary  . CHOLECYSTECTOMY    . COLONOSCOPY  2012   benign colonic polyps  . ESOPHAGOGASTRODUODENOSCOPY  2012   neg  . IVC Filter  2007   s/p DVT and P.E>  . LAPAROSCOPIC ASSISTED RADICAL VAGINAL HYSTERECTOMY W/ NODE BIOPSY  08/11/05  . LYMPHADENECTOMY    . rotator cuff repair Right    Family History:  Family History  Problem Relation Age of Onset  . Stomach cancer Sister   . Brain cancer Brother   . Lung cancer Brother   . Breast cancer Sister   . Colon cancer Neg Hx    Social History: Lives with her son and his family. Independent in all ADLs. Walks with a cane. Never smoker. Denies etoh and illicit drug use.   Review of Systems: A complete ROS was negative except as per HPI.   Physical Exam:  Blood pressure (!) 149/92, pulse 77, temperature 99.3 F (37.4 C), resp. rate 18, height 5\' 7"  (1.702 m), weight 50.3 kg, SpO2 100 %.  Constitutional: Frail, elderly female. No distress. Neuro: Alert and oriented x3. Difficulty with word finding. Intermittent dysarthria. Naming intact. Pill-rolling tremor at rest. Cranial nerves II-XII intact. Strength 5/5 throughout. Normal sensation throughout.  Cardiovascular: Normal rate and regular rhythm. No murmurs, rubs, or gallops. Pulmonary/Chest: Effort normal. Clear to auscultation bilaterally. No wheezes, rales, or rhonchi. Abdominal: Bowel sounds present. Soft, non-distended, non-tender. Ext: Legs are asymmetric with 1+ pitting edema of the left and no  edema of the right. TTP on the LLE, no edema or increased warmth. Skin: Warm and dry. No rashes or wounds.  EKG: personally reviewed my interpretation is NSR with LVH.   CXR: personally reviewed my interpretation is no edema or opacities.  Brain MRI 1. No acute intracranial process. 2. Old small RIGHT frontal lobe infarct. Chronic partially thrombosed superior sagittal sinus.  Assessment & Plan by Problem: Principal Problem:   Dysarthria  Ms. 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 The dysarthria has improved since onset, but is still present. She has no other neurological deficits.   Aphasia - Brain MRI negative for acute infarct. Possible TIA as symptoms are improving and started just hours prior to arrival. Patient's daughter has also noted similar episodes of aphasia intermittently for the past 2-3 weeks, which may also represent TIAs.  - No metabolic abnormalities. Normal mentation. No history  of dementia. Onset of symptoms seems too acute for dementia.  - Passed swallow eval, diet given Plan - Tele - Lipid panel, A1c - PT/OT - Consider further imaging for TIA workup if symptoms have resolved tomorrow  Fever - One documented fever to 102 with EMS. She has been afebrile since arriving to the hospital. Denies fevers at home. Denies other infectious symptoms including cough and dysuria. CXR and UA are unremarkable. She is not altered. Low suspicion for infection, including covid.  Plan - F/u urine culture - Monitor fever   DVT/PE on Warfarin - INR 2.8. Warfarin per pharmacy. - Trend INR  Chronic LLE edema - Present for decades. Likely 2/2 to prior DVT and lymphadenectomy during treatment for uterine cancer. She has seen vascular for this problem in the past and compression stockings were recommended. She sometimes wears these at home. No suspicion for acute DVT at this time.  Anxiety - Patient attributes her tremor to anxiety. States has been worked up for [employer], but does not have it. Takes ativan twice a day at home. - Continue home ativan 0.5 mg BID prn and mirtazepine 15mg  qhs  IBS-D - Patient has intermittent diarrhea chronically. - Continue home imodium  GERD: continue home prilosec  FEN: no IVF fluids, regular diet, replace electrolytes as needed  DVT ppx: continue home warfarin Code status: FULL code  Dispo: Admit patient to Inpatient with expected length of stay greater than 2 midnights.  Signed: Corinne Ports, MD 04/06/2018, 10:27 PM  Pager: [phone]

## 2018-04-07 ENCOUNTER — Encounter (HOSPITAL_COMMUNITY): Payer: Self-pay | Admitting: Neurology

## 2018-04-07 ENCOUNTER — Inpatient Hospital Stay (HOSPITAL_COMMUNITY): Payer: Medicare (Managed Care)

## 2018-04-07 DIAGNOSIS — Z885 Allergy status to narcotic agent status: Secondary | ICD-10-CM

## 2018-04-07 DIAGNOSIS — Z888 Allergy status to other drugs, medicaments and biological substances status: Secondary | ICD-10-CM

## 2018-04-07 DIAGNOSIS — G20 Parkinson's disease: Principal | ICD-10-CM

## 2018-04-07 DIAGNOSIS — Z86711 Personal history of pulmonary embolism: Secondary | ICD-10-CM

## 2018-04-07 DIAGNOSIS — I361 Nonrheumatic tricuspid (valve) insufficiency: Secondary | ICD-10-CM

## 2018-04-07 DIAGNOSIS — K219 Gastro-esophageal reflux disease without esophagitis: Secondary | ICD-10-CM

## 2018-04-07 DIAGNOSIS — I69354 Hemiplegia and hemiparesis following cerebral infarction affecting left non-dominant side: Secondary | ICD-10-CM

## 2018-04-07 DIAGNOSIS — F0280 Dementia in other diseases classified elsewhere without behavioral disturbance: Secondary | ICD-10-CM

## 2018-04-07 DIAGNOSIS — Z90710 Acquired absence of both cervix and uterus: Secondary | ICD-10-CM

## 2018-04-07 DIAGNOSIS — Z7901 Long term (current) use of anticoagulants: Secondary | ICD-10-CM

## 2018-04-07 DIAGNOSIS — M7989 Other specified soft tissue disorders: Secondary | ICD-10-CM

## 2018-04-07 DIAGNOSIS — Z881 Allergy status to other antibiotic agents status: Secondary | ICD-10-CM

## 2018-04-07 DIAGNOSIS — K580 Irritable bowel syndrome with diarrhea: Secondary | ICD-10-CM

## 2018-04-07 DIAGNOSIS — G252 Other specified forms of tremor: Secondary | ICD-10-CM

## 2018-04-07 DIAGNOSIS — Z9089 Acquired absence of other organs: Secondary | ICD-10-CM

## 2018-04-07 DIAGNOSIS — I340 Nonrheumatic mitral (valve) insufficiency: Secondary | ICD-10-CM

## 2018-04-07 DIAGNOSIS — Z8544 Personal history of malignant neoplasm of other female genital organs: Secondary | ICD-10-CM

## 2018-04-07 DIAGNOSIS — Z886 Allergy status to analgesic agent status: Secondary | ICD-10-CM

## 2018-04-07 DIAGNOSIS — Z9181 History of falling: Secondary | ICD-10-CM

## 2018-04-07 DIAGNOSIS — Z95828 Presence of other vascular implants and grafts: Secondary | ICD-10-CM

## 2018-04-07 DIAGNOSIS — Z86718 Personal history of other venous thrombosis and embolism: Secondary | ICD-10-CM

## 2018-04-07 DIAGNOSIS — F419 Anxiety disorder, unspecified: Secondary | ICD-10-CM

## 2018-04-07 DIAGNOSIS — Z79899 Other long term (current) drug therapy: Secondary | ICD-10-CM

## 2018-04-07 DIAGNOSIS — R4701 Aphasia: Secondary | ICD-10-CM

## 2018-04-07 DIAGNOSIS — Z91048 Other nonmedicinal substance allergy status: Secondary | ICD-10-CM

## 2018-04-07 LAB — PROTIME-INR
INR: 2.2 — ABNORMAL HIGH (ref 0.8–1.2)
Prothrombin Time: 24.2 seconds — ABNORMAL HIGH (ref 11.4–15.2)

## 2018-04-07 LAB — BASIC METABOLIC PANEL
Anion gap: 3 — ABNORMAL LOW (ref 5–15)
BUN: 10 mg/dL (ref 8–23)
CO2: 30 mmol/L (ref 22–32)
Calcium: 9.3 mg/dL (ref 8.9–10.3)
Chloride: 108 mmol/L (ref 98–111)
Creatinine, Ser: 1 mg/dL (ref 0.44–1.00)
GFR calc Af Amer: >60 mL/min (ref >60)
GFR calc non Af Amer: 53 mL/min — ABNORMAL LOW (ref >60)
Glucose, Bld: 91 mg/dL (ref 70–99)
Potassium: 3.4 mmol/L — ABNORMAL LOW (ref 3.5–5.1)
Sodium: 141 mmol/L (ref 135–145)

## 2018-04-07 LAB — ECHOCARDIOGRAM LIMITED
Height: 67 in
Weight: 1897.72 oz

## 2018-04-07 LAB — CBC
HCT: 34.6 % — ABNORMAL LOW (ref 36.0–46.0)
Hemoglobin: 10.9 g/dL — ABNORMAL LOW (ref 12.0–15.0)
MCH: 29.2 pg (ref 26.0–34.0)
MCHC: 31.5 g/dL (ref 30.0–36.0)
MCV: 92.8 fL (ref 80.0–100.0)
Platelets: 226 10*3/uL (ref 150–400)
RBC: 3.73 MIL/uL — ABNORMAL LOW (ref 3.87–5.11)
RDW: 12.1 % (ref 11.5–15.5)
WBC: 5.7 10*3/uL (ref 4.0–10.5)
nRBC: 0 % (ref 0.0–0.2)

## 2018-04-07 LAB — HEMOGLOBIN A1C
Hgb A1c MFr Bld: 5.2 % (ref 4.8–5.6)
Mean Plasma Glucose: 102.54 mg/dL

## 2018-04-07 LAB — RAPID URINE DRUG SCREEN, HOSP PERFORMED
Amphetamines: NOT DETECTED
Barbiturates: NOT DETECTED
Benzodiazepines: NOT DETECTED
Cocaine: NOT DETECTED
Opiates: NOT DETECTED
Tetrahydrocannabinol: NOT DETECTED

## 2018-04-07 LAB — LIPID PANEL
Cholesterol: 145 mg/dL (ref 0–200)
HDL: 83 mg/dL (ref >40)
LDL Cholesterol: 57 mg/dL (ref 0–99)
Total CHOL/HDL Ratio: 1.7 RATIO
Triglycerides: 27 mg/dL (ref <150)
VLDL: 5 mg/dL (ref 0–40)

## 2018-04-07 MED ORDER — WARFARIN SODIUM 6 MG PO TABS: 6.0000 mg | ORAL_TABLET | ORAL | Status: DC

## 2018-04-07 MED ORDER — WARFARIN - PHYSICIAN DOSING INPATIENT: Freq: Every day | Status: DC

## 2018-04-07 MED ORDER — WARFARIN SODIUM 6 MG PO TABS: 6.0000 mg | ORAL_TABLET | Freq: Every day | ORAL | Status: DC

## 2018-04-07 MED ORDER — LORAZEPAM 1 MG PO TABS: ORAL_TABLET | ORAL | 0 refills | Status: AC

## 2018-04-07 MED ORDER — WARFARIN SODIUM 4 MG PO TABS
4.0000 mg | ORAL_TABLET | ORAL | Status: DC
  Filled 2018-04-07: qty 1

## 2018-04-07 MED ORDER — CHOLESTYRAMINE 4 G PO PACK: 4.0000 g | PACK | Freq: Every day | ORAL | Status: DC

## 2018-04-07 MED ORDER — LOPERAMIDE HCL 2 MG PO CAPS: 2.0000 mg | ORAL_CAPSULE | Freq: Two times a day (BID) | ORAL | Status: DC | PRN

## 2018-04-07 MED ORDER — ESCITALOPRAM OXALATE 10 MG PO TABS
10.0000 mg | ORAL_TABLET | Freq: Every day | ORAL | Status: DC
  Administered 2018-04-07: 12:00:00 10 mg via ORAL
  Filled 2018-04-07: qty 1

## 2018-04-07 MED ORDER — MIRTAZAPINE 7.5 MG PO TABS: 7.5000 mg | ORAL_TABLET | Freq: Every evening | ORAL | 0 refills | Status: DC | PRN

## 2018-04-07 MED ORDER — MIRTAZAPINE 15 MG PO TABS: 7.5000 mg | ORAL_TABLET | Freq: Every evening | ORAL | Status: DC | PRN

## 2018-04-07 MED ORDER — BUSPIRONE HCL 5 MG PO TABS: 5.0000 mg | ORAL_TABLET | Freq: Two times a day (BID) | ORAL | 0 refills | Status: DC

## 2018-04-07 MED ORDER — ESCITALOPRAM OXALATE 10 MG PO TABS: 5.0000 mg | ORAL_TABLET | Freq: Every day | ORAL | Status: DC

## 2018-04-07 MED ORDER — LORAZEPAM 1 MG PO TABS: ORAL_TABLET | ORAL | 0 refills | Status: DC

## 2018-04-07 MED ORDER — ESCITALOPRAM OXALATE 5 MG PO TABS: 5.0000 mg | ORAL_TABLET | Freq: Every day | ORAL | 0 refills | Status: AC

## 2018-04-07 MED ORDER — BUSPIRONE HCL 5 MG PO TABS: 5.0000 mg | ORAL_TABLET | Freq: Two times a day (BID) | ORAL | Status: DC

## 2018-04-07 MED ORDER — WARFARIN - PHARMACIST DOSING INPATIENT: Freq: Every day | Status: DC

## 2018-04-07 MED ORDER — WARFARIN SODIUM 4 MG PO TABS: 4.0000 mg | ORAL_TABLET | Freq: Once | ORAL | Status: DC

## 2018-04-07 MED ORDER — WARFARIN SODIUM 4 MG PO TABS: 4.0000 mg | ORAL_TABLET | Freq: Every day | ORAL | Status: DC

## 2018-04-07 NOTE — Discharge Summary (Signed)
Name: [name] MRN: [id number] DOB: [birth year] 84 y.o. PCP: Janifer Adie, MD  Date of Admission: 04/06/2018  4:38 PM Date of Discharge: 04/07/18 Attending Physician: No att. providers found  Discharge Diagnosis: 1. Expressive aphasia 2/2 benzodiazepine use and underlying dementia  2. Parkinson's disease with possible dementia  3. Anxiety   Discharge Medications: Allergies as of 04/07/2018      Reactions   Aspridrox [aspirin Buf(alhyd-mghyd-cacar)] Other (See Comments)   unknown   Codeine Other (See Comments)   Unknown   Darvocet [propoxyphene N-acetaminophen] Other (See Comments)   unknown   Esomeprazole Magnesium Other (See Comments)   unknown   Iodine Other (See Comments)   unknown   Morphine And Related Other (See Comments)   unknown   Sulfa Antibiotics Other (See Comments)   unknown   Sulfate Other (See Comments)   unknown   Tramadol Other (See Comments)   unknown      Medication List    TAKE these medications   acetaminophen 650 MG CR tablet Commonly known as:  TYLENOL Take 650 mg by mouth every 8 (eight) hours as needed for pain.   Biofreeze 4 % Gel Generic drug:  Menthol (Topical Analgesic) Apply 3.5 % topically 2 (two) times daily.   busPIRone 5 MG tablet Commonly known as:  BUSPAR Take 1 tablet (5 mg total) by mouth 2 (two) times daily.   calcium carbonate 750 MG chewable tablet Commonly known as:  TUMS EX Chew 1 tablet by mouth 2 (two) times daily.   cholecalciferol 1000 units tablet Commonly known as:  VITAMIN D Take 1,000 Units by mouth daily.   cholestyramine 4 g packet Commonly known as:  Questran Take 1 packet (4 g total) by mouth daily. Take 1.5 hours away from other medications.   Ensure Plus Liqd Take 237 mLs by mouth.   feeding supplement (ENSURE) Pudg Take 1 Container by mouth 2 (two) times daily between meals.   escitalopram 5 MG tablet Commonly known as:  LEXAPRO Take 1 tablet (5 mg total) by mouth daily. Start  taking on:  April 08, 2018 What changed:    medication strength  how much to take   loperamide 2 MG capsule Commonly known as:  IMODIUM Take 2 mg by mouth 2 (two) times daily as needed for diarrhea or loose stools.   LORazepam 1 MG tablet Commonly known as:  ATIVAN Take 0.5 tablets (0.5 mg total) by mouth 2 (two) times daily for 3 days, THEN 0.5 tablets (0.5 mg total) every morning for 3 days. Start taking on:  April 07, 2018 What changed:    See the new instructions.  Another medication with the same name was removed. Continue taking this medication, and follow the directions you see here.   mirtazapine 7.5 MG tablet Commonly known as:  REMERON Take 1 tablet (7.5 mg total) by mouth at bedtime as needed (sleep). What changed:    medication strength  how much to take  when to take this  reasons to take this   omeprazole 20 MG capsule Commonly known as:  PRILOSEC Take 20 mg by mouth daily.   PRESERVISION AREDS 2 PO Take 1 capsule by mouth daily.   Vitamin B 12 100 MCG Lozg Take 100 mcg by mouth daily.   warfarin 4 MG tablet Commonly known as:  COUMADIN Take 4-6 mg by mouth See admin instructions. 4mg  on Mon and Thurs and 6mg  all other days       Disposition  and follow-up:   DeborahDeborah [name] was discharged from [facility] in Stable condition.  At the hospital follow up visit please address:  1. Expressive aphasia 2/2 benzodiazepine use and underlying dementia  - taper off Ativan over the next week: 0.5mg  bid for 3 days followed by 0.5mg  qd for 3 days - started buspirone 5mg  bid - decreased escitalopram to 5mg  and mirtazapine to 7.5mg  in order to reduce risk of serotonin syndrome - monitor for and educate family on symptoms of serotonin syndrome   2. Parkinson's disease with possible dementia  - defer sinimet trial since she is not endorsing difficulty walking - recommend outpt neuro f/u for cognitive evaluation given Parkinson's disease -  recommend sleep study for possible REM sleep disorder which can be associated with parkinson's disease and lewy body dementia  3. Anxiety: per problem #1   2.  Labs / imaging needed at time of follow-up: none  3.  Pending labs/ test needing follow-up: Urine culture  Follow-up Appointments: Follow-up Information    Janifer Adie, MD. Schedule an appointment as soon as possible for a visit in 1 week(s).   Specialty:  Family Medicine Contact information: [place] [address] [phone]        [place]. Schedule an appointment as soon as possible for a visit in 2 week(s).   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 The dysarthria has improved since onset, but is still present. She has no other neurological deficits.  1. Expressive aphasia 2/2 benzodiazepine use and underlying dementia: Brain MRI negative for acute infarct and showed mild chronic small vessel ischemia, less than expected for age. No metabolic derangements. A1c and lipid panel were WNLs. Echo showed LVEF 50-55%, incoordinate septal motion, mild LVH, normal biatrial size, myxomatous mitral valve with mild late systolic prolapse of the AMVL and mild, posteriorly directed mitral regurgitation, mild TR, RSVP 28 mmHg, normal IVC, trivial posterior pericardial effusion. Neurology was consulted and felt that her symptoms were best attributed to side effects of chronic benzo use. Recommend tapering  off Ativan, starting buspar because it is a highly effective anxiolytic and a much safer choice than Ativan in this elderly patient. Decrease home escitalopram and mirtazapine to reduce risk of serotonin syndrome. Patient and family were educated on symptoms of serotonin syndrome.  Of note, PT OT recommended SNF or home health with 24-hour supervision.  This was discussed with [facility] and patient's family and it was decided to discharge patient home.  2. Parkinson's disease with possible dementia: Diagnosed with Parkinson's disease per [facility] but patient and family have deferred treatment since symptoms are not interfering with her quality of life. If she begins having gait difficulty, will consider starting Sinemet at that time.  As of now, she would benefit from outpatient neurology follow-up for a cognitive evaluation as well as a sleep study.  She describes movements in her sleep that are concerning for REM sleep disorder which  can be associated with Parkinson's disease as well as Lewy body dementia.  3. Anxiety: Chronic.  Has been on regular Ativan for quite some time.  Also takes escitalopram and mirtazapine.  Please see medication changes under problem #1.  4. Fever: EMS documented one temperature of 102 F.  Unclear if antipyretic was administered.  Since arrival to the hospital she remained afebrile.  Infectious work-up was unremarkable, including chest x-ray and urinalysis.  Urine culture was still in process at time of discharge.  She did not have any infectious symptoms.  Remains afebrile since admission.  No antibiotics were given.  Low suspicion for infection.  DVT/PE on Warfarin: Continue home warfarin dose  IBS-D:  Stable.  Continued home cholestyramine, loperamide    Discharge Vitals:   BP (!) 165/70 (BP Location: Right Arm)    Pulse 65    Temp 97.8 F (36.6 C) (Oral)    Resp 18    Ht 5\' 7"  (1.702 m)    Wt 53.8 kg    SpO2 95%    BMI 18.58 kg/m   Pertinent Labs, Studies, and  Procedures:  MRI HEAD WITHOUT CONTRAST  TECHNIQUE: Multiplanar, multiecho pulse sequences of the brain and surrounding structures were obtained without intravenous contrast.  COMPARISON:  CT HEAD April 06, 2018 and MRI head August 08, 2008 and MRV head August 09, 2008.  FINDINGS: INTRACRANIAL CONTENTS: No reduced diffusion to suggest acute ischemia. No susceptibility artifact to suggest hemorrhage. No parenchymal brain volume loss for age. No hydrocephalus. Small area RIGHT frontal convexity encephalomalacia associated with susceptibility artifact compatible with mineralization a few scattered subcentimeter supratentorial white matter FLAIR T2 hyperintensities compatible mild chronic small vessel ischemic changes, less than expected for age. No suspicious parenchymal signal, masses, mass effect. No abnormal extra-axial fluid collections. No extra-axial masses.  VASCULAR: Normal major intracranial vascular flow voids present at skull base. Segmental attenuation of superior sagittal sinus flow void.  SKULL AND UPPER CERVICAL SPINE: No abnormal sellar expansion. No suspicious calvarial bone marrow signal. Craniocervical junction maintained.  SINUSES/ORBITS: The mastoid air-cells and included paranasal sinuses are well-aerated.The included ocular globes and orbital contents are non-suspicious. Status post bilateral ocular lens implants.  OTHER: None.  IMPRESSION: 1. No acute intracranial process. 2. Old small RIGHT frontal lobe infarct. Chronic partially thrombosed superior sagittal sinus.  CT HEAD WITHOUT CONTRAST  TECHNIQUE: Contiguous axial images were obtained from the base of the skull through the vertex without intravenous contrast.  COMPARISON:  12/31/2012.  FINDINGS: Brain: No evidence for acute infarction, hemorrhage, mass lesion, hydrocephalus, or extra-axial fluid. Generalized atrophy. Chronic microvascular ischemic change. Remote chronic RIGHT  cerebellar infarct.  Vascular: Calcification of the cavernous internal carotid arteries consistent with cerebrovascular atherosclerotic disease. No signs of intracranial large vessel occlusion.  Skull: Calvarium intact.  Sinuses/Orbits: No acute finding.  Other: None.  IMPRESSION: Atrophy and small vessel disease. No acute intracranial findings.  EXAM: PORTABLE CHEST 1 VIEW  COMPARISON:  Radiographs of March 14, 2018.  FINDINGS: The heart size and mediastinal contours are within normal limits. Both lungs are clear. The visualized skeletal structures are unremarkable.  IMPRESSION: No active disease.  Echo:  1. The left ventricle has low normal systolic function, with an ejection fraction of 50-55%. The cavity size was normal. There is moderately increased left ventricular wall thickness. Left ventricular diastolic Doppler parameters are indeterminate.  There is incoordinate septal motion.  2. The right ventricle has normal systolic function. The cavity was normal. There is no increase in right  ventricular wall thickness.  3. The pericardial effusion is posterior to the left ventricle.  4. Trivial pericardial effusion is present.  5. Moderate mitral valve prolapse.  6. The mitral valve is myxomatous. Moderate thickening of the mitral valve leaflet. The MR jet is posteriorly-directed.  7. The tricuspid valve is myxomatous.  8. The aortic valve is tricuspid. Mild sclerosis of the aortic valve.  9. The interatrial septum was not assessed.   Discharge Instructions: Discharge Instructions    Discharge instructions   Complete by:  As directed    [name], [name]  You were admitted to the hospital for difficulty speaking. Thankfully we did not find any evidence of a stroke. The neurologist (brain doctors) evaluated you and think that your symptoms are a side effect of the Ativan you've been taking. As people age, Ativan (and other benzodiazepines) have a higher risk of  side effects. We are recommending that you taper off of this medicine over the next week. Please follow the instructions found in this discharge paperwork.   You will take Ativan 0.5 mg twice daily for the next 3 days followed by Ativan 0.5 mg every morning for 3 days and then stop taking Ativan after that.   In order to help treat your anxiety, we are starting you on a new medicine called BuSpar (buspirone). This will be a safer medicine than Ativan. The main side effect to be aware of is dizziness. You will take this medicine once in the morning and again in the evening.   Since we are adding BuSpar, please decrease the dose of your other anxiety medicines (Lexapro and Remeron). The new doses are reflected on your medication list in this paperwork. Another side effect to watch out for is serotonin syndrome. Symptoms of this would be sweating, agitation, fever. If you experience any of these symptoms please come to the hospital.   I have talked with [facility] and they will arrange some home physical therapy for you. If you notice worsening confusion, difficulty speaking, weakness, numbness please come back to the hospital.  Stay well! It was a pleasure taking care of you.   -Dr. Donne Hazel      Signed: Isabelle Course, MD 04/07/2018, 4:49 PM   Pager: [phone]

## 2018-04-07 NOTE — Progress Notes (Signed)
  Echocardiogram 2D Echocardiogram limited has been performed due to patient contact precautions.  [name] 04/07/2018, 10:53 AM

## 2018-04-07 NOTE — TOC Initial Note (Signed)
Transition of Care [facility]) - Initial/Assessment Note    Patient Details  Name: [name] MRN: [id number] Date of Birth: Jan 04, 1935  Transition of Care [facility]) CM/SW Contact:    Pollie Friar, RN Phone Number: 04/07/2018, 1:23 PM  Clinical Narrative:                 CM spoke with [place], SW for [facility]. They are aware of PT/OT recommendations. They will discuss these with the group and updated CM/CSW on plans at d/c.   Expected Discharge Plan: Skilled Nursing Facility Barriers to Discharge: Continued Medical Work up   Patient Goals and CMS Choice        Expected Discharge Plan and Services Expected Discharge Plan: [place]                                  Prior Living Arrangements/Services   Lives with:: Adult Children(son) Patient language and need for interpreter reviewed:: Yes(no needs)        Need for Family Participation in Patient Care: Yes (Comment) Care giver support system in place?: Yes (comment)(lives with son and has [facility]) Current home services: DME, Home PT, Other (comment)(cane// Pt is active [facility]) Criminal Activity/Legal Involvement Pertinent to Current Situation/Hospitalization: No - Comment as needed  Activities of Daily Living      Permission Sought/Granted                  Emotional Assessment     Affect (typically observed): Pleasant Orientation: : Oriented to Self, Oriented to Place, Oriented to  Time, Oriented to Situation   Psych Involvement: No (comment)  Admission diagnosis:  Delirium [R41.0] Patient Active Problem List   Diagnosis Date Noted  . Aphasia 04/06/2018  . Dysphagia 03/11/2016  . Irritable bowel syndrome with diarrhea 03/11/2016  . History of colonic polyps 05/24/2013  . Malignant neoplasm of corpus uteri, except isthmus ([place]) 12/22/2010  . Pulmonary embolus ([place])   . DVT (deep venous thrombosis) ([place])   . Chronic diarrhea   . Insomnia   . Multiple thyroid nodules   .  Anorexia    PCP:  Janifer Adie, MD Pharmacy:  No Pharmacies Listed    Social Determinants of Health (SDOH) Interventions  [facility] and son provide transportation. [facility] over sees her meds.   Readmission Risk Interventions No flowsheet data found.

## 2018-04-07 NOTE — Progress Notes (Addendum)
OT Evaluation  PTA, pt lived at home with her son, who works during the day. Per pt, she has a PCA who assists with ADL tasks. Note pt is a [facility] participant.and unsure if pt goes to [facility] as well as having PCA. Pt unable to give this information at this time.  If pt DC home, she needs direct S with all mobility and ADL @ RW level due to her fall risk. Recommend 24/7 S  with HHOT at this time. Pt demonstrates difficulty with word finding. States she has a fear of falling but feels she is "getting back to normal" other than her speech. Will follow acutely to facilitate safe DC home with 24/7 S.     04/07/18 1200  OT Visit Information  Last OT Received On 04/07/18  Assistance Needed +1 (+2 for ambulation progression)  History of Present Illness Pt is an 84 y/o female admitted secondary to difficulty word finding. MRI of the brain was negative for any acute findings. 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  Precautions  Precautions Fall  Restrictions  Weight Bearing Restrictions No  Home Living  Family/patient expects to be discharged to: Private residence  Living Arrangements Children  Available Help at Discharge Family;Personal care attendant;Available PRN/intermittently (has an aide from 8:00-11:30am daily)  Type of [place] Access Level entry  [place] One level  Bathroom Shower/Tub Tub/shower unit  Corporate treasurer  (unsure)  Home Equipment Shower seat;Cane - single point;Walker - 2 wheels  Additional Comments Per chart pt came in [facility] [place] [facility]  Prior Function  Level of Independence Needs assistance  Gait / Transfers Assistance Needed ambulates with use of a cane  ADL's / [place]  requires assistance from either her aide or her son for dressing/bathing  Communication  Communication Expressive difficulties  Pain Assessment  Pain Assessment No/denies pain  Cognition  Arousal/Alertness Awake/alert  Behavior During Therapy Flat affect  Overall Cognitive Status No family/caregiver present to determine baseline cognitive functioning  Area of Impairment Memory;Following commands;Safety/judgement;Awareness;Problem solving;Orientation  Orientation Level Place;Time  Memory Decreased short-term memory  Following Commands Follows one step commands inconsistently;Follows one step commands with increased time  Safety/Judgement Decreased awareness of deficits;Decreased awareness of safety  Awareness Emergent  Problem Solving Slow processing;Decreased initiation;Difficulty sequencing;Requires verbal cues;Requires tactile cues  Upper Extremity Assessment  Upper Extremity Assessment Generalized weakness  Lower Extremity Assessment  Lower Extremity Assessment Defer to PT evaluation  Cervical / Trunk Assessment  Cervical / Trunk Assessment Kyphotic  ADL  Overall ADL's  Needs assistance/impaired  Eating/Feeding Set up  Grooming Set up;Supervision/safety;Sitting  Upper Body Bathing Set up;Supervision/ safety;Sitting  Lower Body Bathing Sit to/from stand;Minimal assistance  Upper Body Dressing  Minimal assistance;Sitting  Lower Body Dressing Moderate assistance;Sit to/from Retail buyer Minimal assistance;Stand-pivot;Cueing for safety;Cueing for sequencing  Toileting- Clothing Manipulation and Hygiene Minimal assistance;Sit to/from stand  Functional mobility during ADLs Minimal assistance;Rolling walker;Cueing for safety;Cueing for sequencing; at baseline states she is modified independent with toilet transfers and hygiene  Vision- History  Baseline Vision/History Wears glasses  Vision- Assessment  Additional Comments per pt wears glasses at all times; doe snot have them  on eval which may be afftecting performance  Perception  Comments poor  spatial awareness; may be due to not having galsses  Praxis  Praxis-Other Comments will further assess  Bed Mobility  Overal bed mobility Needs Assistance  Bed Mobility Supine to Sit;Sit to Supine  Supine to sit Supervision  Transfers  Overall transfer level Needs assistance  Equipment used Rolling walker (2 wheeled)  Transfers Sit to/from Stand  Sit to Stand From elevated surface;Min assist  Balance  Overall balance assessment Needs assistance  Sitting-balance support Feet supported  Sitting balance-Leahy Scale Fair  Standing balance support Bilateral upper extremity supported  Standing balance-Leahy Scale Poor  OT - End of Session  Equipment Utilized During Treatment Gait belt;Rolling walker  Activity Tolerance Patient tolerated treatment well  Patient left in chair;with call bell/phone within reach;with chair alarm set  Nurse Communication Mobility status  OT Assessment  OT Recommendation/Assessment Patient needs continued OT Services  OT Visit Diagnosis Unsteadiness on feet (R26.81);Muscle weakness (generalized) (M62.81)  OT Problem List Decreased strength;Decreased activity tolerance;Impaired balance (sitting and/or standing);Impaired vision/perception;Decreased safety awareness;Decreased cognition  OT Plan  OT Frequency (ACUTE ONLY) Min 2X/week  OT Treatment/Interventions (ACUTE ONLY) Self-care/ADL training;Therapeutic exercise;DME and/or AE instruction;Therapeutic activities;Cognitive remediation/compensation;Visual/perceptual remediation/compensation;Patient/family education;Balance training  AM-PAC OT "6 Clicks" Daily Activity Outcome Measure (Version 2)  Help from another person eating meals? 4  Help from another person taking care of personal grooming? 3  Help from another person toileting, which includes using toliet, bedpan, or urinal? 3  Help from another person bathing (including washing,  rinsing, drying)? 3  Help from another person to put on and taking off regular upper body clothing? 3  Help from another person to put on and taking off regular lower body clothing? 3  6 Click Score 19  OT Recommendation  Recommendations for Other Services Speech consult  Follow Up Recommendations Home health OT;Supervision/Assistance - 24 hour;Other (comment) (only if 24/7 S)  OT Equipment 3 in 1 bedside commode  Individuals Consulted  Consulted and Agree with Results and Recommendations Patient  Acute Rehab OT Goals  Patient Stated Goal for her speech to return to normal  OT Goal Formulation With patient  Time For Goal Achievement 04/21/18  Potential to Achieve Goals Good  OT Time Calculation  OT Start Time (ACUTE ONLY) 1116  OT Stop Time (ACUTE ONLY) 1143  OT Time Calculation (min) 27 min  OT General Charges  $OT Visit 1 Visit  OT Evaluation  $OT Eval Moderate Complexity 1 Mod  OT Treatments  $Self Care/Home Management  8-22 mins  Written Expression  Dominant Hand Right  Maurie Boettcher, OT/L   Acute OT Clinical Specialist [place] Pager [phone] Office [phone]

## 2018-04-07 NOTE — Consult Note (Signed)
Neurology Consultation  Reason for Consult: Word finding difficulty.  Referring Physician: Dr. Dareen Piano  CC: Speech difficulty  HPI: [name] is a 84 y.o. female with PMHx significant for CVA in 2012 with residual mild left-sided weakness, DVT and PE on warfarin and with IVC in place, IBS-D, endometrial cancer in the 1970s s/p hysterectomy and lymphadenectomy, chronic swelling of the LLE, anxiety, and tremor who presents to the ED with difficulty finding certain words.  Stroke work-up is negative and there was some concern about Parkinson's so neurology was consulted. Parkinson's is a known diagnosis at [facility] but is not on the list of her diagnoses in Epic (has been added at the time of this consult).   Per chart review patient's daughter noted intermittent difficulty with finding words and understanding her speech over the past couple of weeks.  Per patient she is having problem finding words since last Friday.  Had a fall 2 weeks ago in the shower with no significant injuries.  Of note, the patient endorses having been started on Ativan about one month ago, which she takes regularly BID.   Per patient she is also having tremors for the past 3 years and she was told that she has "weaker nerves".   She states that part of her speech problem consists of a hushed (hypophonic) voice and that it becomes tiring for her to talk; another component of the speech deficit is difficulty with word finding. She denies difficulty with understanding speech.  She also denies any difficulty swallowing.  She denies any headaches or change in her vision.  She often gets excited. She has had symptom of "fighting in her sleep" as though acting out dreams - her son normally wakes her up during those events. She denies visual hallucinations. She walks with the help of a cane or walker and denies any additional new ambulatory difficulty.  She denies any problem keeping up with her daily living and do help with cooking  and cleaning at home.  She denies any recent illnesses or sick contacts.  ROS: A 14 point ROS was performed and is negative except as noted in the HPI.  Past Medical History:  Diagnosis Date  . Anorexia   . Anxiety   . Blood transfusion without reported diagnosis    1970's  . Chronic diarrhea   . Clotting disorder ([place])   . Depression   . DVT (deep venous thrombosis) ([place])   . Endometrial ca ([place])   . Hiatal hernia   . History of D&C   . History of sinus surgery   . Hypokalemia   . Insomnia   . Multiple thyroid nodules 12/07   b/l nodules thyroid   . Neuropathy   . Pulmonary embolus ([place])   . Radiation Jan.3-Feb.6,2008   External Beam to pelvis  . Radiation GUR.42,70 and 27/2008   Intracavitary brachytherapy  . Seizure disorder ([place])     Family History  Problem Relation Age of Onset  . Stomach cancer Sister   . Brain cancer Brother   . Lung cancer Brother   . Breast cancer Sister   . Colon cancer Neg Hx     Social History:   reports that she has never smoked. She has never used smokeless tobacco. She reports that she does not drink alcohol or use drugs. Lives with her son and independent in ADLs.  Walks with help of a cane or walker  Medications  Current Facility-Administered Medications:  .  cholecalciferol (VITAMIN D3) tablet  1,000 Units, 1,000 Units, Oral, Daily, Velna Ochs, MD .  cholestyramine Lucrezia Starch) packet 4 g, 4 g, Oral, Daily, Neva Seat, MD .  escitalopram (LEXAPRO) tablet 10 mg, 10 mg, Oral, Daily, Neva Seat, MD .  loperamide (IMODIUM) capsule 2 mg, 2 mg, Oral, BID PRN, Neva Seat, MD .  LORazepam (ATIVAN) tablet 0.5 mg, 0.5 mg, Oral, BID PRN, Velna Ochs, MD .  mirtazapine (REMERON) tablet 15 mg, 15 mg, Oral, QHS PRN, Velna Ochs, MD .  pantoprazole (PROTONIX) EC tablet 40 mg, 40 mg, Oral, Daily, Velna Ochs, MD .  warfarin (COUMADIN) tablet 4 mg, 4 mg, Oral, Once per day on Mon Thu, Vogel, Marie S,  MD .  Derrill Memo ON 04/08/2018] warfarin (COUMADIN) tablet 6 mg, 6 mg, Oral, Once per day on Sun Tue Wed Fri Sat, Donne Hazel, Levester Fresh, MD .  Warfarin - Physician Dosing Inpatient, , Does not apply, q1800, Isabelle Course, MD   Exam: Current vital signs: BP (!) 156/67 (BP Location: Right Arm)   Pulse 68   Temp 98 F (36.7 C) (Oral)   Resp 18   Ht 5\' 7"  (1.702 m)   Wt 53.8 kg   SpO2 100%   BMI 18.58 kg/m  Vital signs in last 24 hours: Temp:  [98 F (36.7 C)-99.5 F (37.5 C)] 98 F (36.7 C) (04/02 0753) Pulse Rate:  [56-79] 68 (04/02 0753) Resp:  [14-21] 18 (04/02 0753) BP: (149-189)/(21-101) 156/67 (04/02 0753) SpO2:  [90 %-100 %] 100 % (04/02 0753) Weight:  [50.3 kg-53.8 kg] 53.8 kg (04/01 2342)  Physical Exam  Constitutional: Appears well-developed and well-nourished.  Psych: Affect appropriate to situation Eyes: No scleral injection HENT: No OP obstrucion Head: Normocephalic.  Cardiovascular: Normal rate and regular rhythm.  Respiratory: Effort normal, non-labored breathing GI: Soft.  No distension. There is no tenderness.  Extremities.  Left lower extremity edema.  Neuro: Mental Status: Patient is awake, alert, oriented to person, place, month, and situation.  Difficulty remembering the year. No signs of aphasia or neglect-some word finding difficulty (able to name 4/6 objects correctly), slow speech but fluent without errors of grammar or syntax. No phonemic or semantic paraphasias noted. No difficulty repeating a phrase. Able to partially correctly interpret a proverb. Cranial Nerves: II: Visual Fields are full. PERRL   III,IV, VI: EOMI without ptosis or diplopia.  V: Facial sensation is symmetric to temperature VII: Facial movement is symmetric.  VIII: hearing is intact to voice X: Uvula elevates symmetrically XI: Shoulder shrug is symmetric. XII: tongue is midline without atrophy or fasciculations.  Motor: Cogwheel rigidity with muscular atrophy more pronounced in  bilateral upper extremities . 5/5 strength was present in all four extremities.  Sensory: Sensation is symmetric to light touch and temperature in the arms and legs, except for mildly altered temperature sensations on left lower extremity in the context of chronic edema. Deep Tendon Reflexes: 2+ and symmetric in the biceps and patellae.  Plantars: Toes are downgoing bilaterally.  Cerebellar: FNF and HKS are intact bilaterally.  Labs I have reviewed labs in epic and the results pertinent to this consultation.   CBC    Component Value Date/Time   WBC 5.7 04/07/2018 0538   RBC 3.73 (L) 04/07/2018 0538   HGB 10.9 (L) 04/07/2018 0538   HGB 11.0 (L) 07/06/2006 1010   HCT 34.6 (L) 04/07/2018 0538   HCT 30.4 (L) 07/06/2006 1010   PLT 226 04/07/2018 0538   PLT 161 07/06/2006 1010   MCV 92.8 04/07/2018  0538   MCV 94.3 07/06/2006 1010   MCH 29.2 04/07/2018 0538   MCHC 31.5 04/07/2018 0538   RDW 12.1 04/07/2018 0538   RDW 12.3 07/06/2006 1010   LYMPHSABS 1.3 04/06/2018 1716   LYMPHSABS 0.5 (L) 07/06/2006 1010   MONOABS 0.4 04/06/2018 1716   MONOABS 0.3 07/06/2006 1010   EOSABS 0.0 04/06/2018 1716   EOSABS 0.1 07/06/2006 1010   BASOSABS 0.0 04/06/2018 1716   BASOSABS 0.0 07/06/2006 1010    CMP     Component Value Date/Time   NA 139 04/06/2018 1716   K 3.9 04/06/2018 1716   CL 106 04/06/2018 1716   CO2 24 04/06/2018 1716   GLUCOSE 88 04/06/2018 1716   BUN 13 04/06/2018 1716   CREATININE 1.05 (H) 04/06/2018 1716   CALCIUM 9.5 04/06/2018 1716   PROT 6.0 (L) 04/06/2018 1716   ALBUMIN 3.2 (L) 04/06/2018 1716   AST 25 04/06/2018 1716   ALT 15 04/06/2018 1716   ALKPHOS 67 04/06/2018 1716   BILITOT 0.9 04/06/2018 1716   GFRNONAA 50 (L) 04/06/2018 1716   GFRAA 58 (L) 04/06/2018 1716    Lipid Panel     Component Value Date/Time   CHOL 145 04/07/2018 0538   TRIG 27 04/07/2018 0538   HDL 83 04/07/2018 0538   CHOLHDL 1.7 04/07/2018 0538   VLDL 5 04/07/2018 0538   LDLCALC  57 04/07/2018 0538    Imaging I have reviewed the images obtained:  CT-scan of the brain without any acute abnormality.  MRI examination of the brain shows cerebral atrophy with no acute intracranial process.  Assessment: 84 y.o. female with PMHx significant for CVA in 2012 with residual mild left-sided weakness, DVT and PE on warfarin and with IVC in place, IBS-D, endometrial cancer in the 1970s s/p hysterectomy and lymphadenectomy, chronic swelling of the LLE, anxiety, and Parkiinson's disease who presents to the ED with difficulty finding certain words. Stroke work-up has been negative. 1. Speech deficit most likely multifactorial: Hypophonic due to Parkinson's disease, mild word finding difficulty most likely due to an underlying mild dementia (most likely Parkinson's disease with dementia), new worsening due to recent initiation of Ativan for anxiety.    2. Possible REM sleep behavior disorder (symptom of thrashing in sleep as though acting out dreams). This is not uncommon in Parkinson's with dementia, as well as in patients with Lewy body dementia - both of these conditions are degenerative synucleinopathies. 3. Anxiety.  Recommendations: 1.  Slowly taper Ativan-decrease dose to 0.5 mg twice daily for 3 days, then 0.5 mg daily preferably in the morning for another 3 days before stopping it. 2.  Start her on low-dose Buspar-5 mg twice daily. 3.  Decreasing her mirtazapine and Lexapro doses by 1/2 to decrease the risk of developing serotonin syndrome now that a third serotonergic medication has been started.  4. We will avoid Sinemet trial at this time as there was no complaint about difficulty with walking or bradykinesia (subjectively asymptomatic).  If she develops difficulty with walking or bradykinesia, Sinemet can be considered at that time.  Sinemet can be associated with the following side effects: orthostatic hypotension, hypertension, psychosis, hallucination and dyskinesia.      Lorella Nimrod PGY3 Pager [phone]   I have seen and examined the patient. I have formulated the assessment and plan.  Electronically signed: Dr. Kerney Elbe

## 2018-04-07 NOTE — Evaluation (Signed)
Physical Therapy Evaluation Patient Details Name: [name] MRN: [id number] DOB: 1934/07/30 Today's Date: 04/07/2018   History of Present Illness  Pt is an 84 y/o female admitted secondary to difficulty word finding. MRI of the brain was negative for any acute findings. 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    Clinical Impression  Pt presented supine in bed with HOB elevated, awake and willing to participate in therapy session. Prior to admission, pt reported that she ambulates with use of a cane and requires assistance from her aide or family for ADLs. Pt lives with her son in a single level home with a level entry. At the time of evaluation, pt required min guard for bed mobility, min A for transfers and min-mod A to ambulate a very short distance (~5') with use of RW. Pt very limited secondary to weakness and fear of falling. Pt would continue to benefit from skilled physical therapy services at this time while admitted and after d/c to address the below listed limitations in order to improve overall safety and independence with functional mobility.     Follow Up Recommendations SNF    Equipment Recommendations  None recommended by PT    Recommendations for Other Services       Precautions / Restrictions Precautions Precautions: Fall Restrictions Weight Bearing Restrictions: No      Mobility  Bed Mobility Overal bed mobility: Needs Assistance Bed Mobility: Supine to Sit;Sit to Supine     Supine to sit: Min guard Sit to supine: Min guard   General bed mobility comments: increased time and effort, HOB elevated, min guard for safety  Transfers Overall transfer level: Needs assistance Equipment used: Rolling walker (2 wheeled) Transfers: Sit to/from  Stand Sit to Stand: From elevated surface;Min assist         General transfer comment: increased time, cueing for safe hand placement, min A to power into standing and for stability; pt able to scoot anteriorly at EOB with min guard for safety  Ambulation/Gait Ambulation/Gait assistance: Min assist;Mod assist Gait Distance (Feet): 5 Feet Assistive device: Rolling walker (2 wheeled) Gait Pattern/deviations: Step-to pattern;Decreased step length - right;Decreased step length - left;Decreased stride length;Shuffle Gait velocity: decreased   General Gait Details: pt with consistent R lateral lean and L side inattention; pt required constant min A with occasional mod A due to LOB and for RW management; pt with very slow, cautious and guarded gait; expressed fear of falling throughout  Stairs            Wheelchair Mobility    Modified Rankin (Stroke Patients Only) Modified Rankin (Stroke Patients Only) Pre-Morbid Rankin Score: Moderately severe disability Modified Rankin: Moderately severe disability     Balance Overall balance assessment: Needs assistance Sitting-balance support: Feet supported Sitting balance-Leahy Scale: Fair     Standing balance support: Bilateral upper extremity supported Standing balance-Leahy Scale: Poor                               Pertinent Vitals/Pain Pain Assessment: No/denies pain    Home Living Family/patient expects to be discharged to:: Private residence Living Arrangements: Children Available Help at Discharge: Family;Personal care attendant;Available PRN/intermittently(has an aide from 8:00-11:30am daily) Type of Home: House Home Access:  Level entry     Home Layout: One level Home Equipment: Shower seat;Cane - single point;Walker - 2 wheels      Prior Function Level of Independence: Needs assistance   Gait / Transfers Assistance Needed: ambulates with use of a cane  ADL's / Homemaking Assistance Needed: requires  assistance from either her aide or her son for dressing/bathing        Hand Dominance        Extremity/Trunk Assessment   Upper Extremity Assessment Upper Extremity Assessment: Defer to OT evaluation    Lower Extremity Assessment Lower Extremity Assessment: Generalized weakness    Cervical / Trunk Assessment Cervical / Trunk Assessment: Kyphotic  Communication   Communication: Expressive difficulties  Cognition Arousal/Alertness: Awake/alert Behavior During Therapy: Flat affect Overall Cognitive Status: No family/caregiver present to determine baseline cognitive functioning Area of Impairment: Memory;Following commands;Safety/judgement;Awareness;Problem solving                     Memory: Decreased short-term memory Following Commands: Follows one step commands inconsistently;Follows one step commands with increased time Safety/Judgement: Decreased awareness of deficits;Decreased awareness of safety Awareness: Intellectual Problem Solving: Slow processing;Decreased initiation;Difficulty sequencing;Requires verbal cues;Requires tactile cues        General Comments      Exercises     Assessment/Plan    PT Assessment Patient needs continued PT services  PT Problem List Decreased strength;Decreased balance;Decreased mobility;Decreased coordination;Decreased cognition;Decreased knowledge of use of DME;Decreased safety awareness       PT Treatment Interventions DME instruction;Gait training;Stair training;Functional mobility training;Therapeutic activities;Therapeutic exercise;Balance training;Neuromuscular re-education;Cognitive remediation;Patient/family education    PT Goals (Current goals can be found in the Care Plan section)  Acute Rehab PT Goals Patient Stated Goal: for her speech to return to normal PT Goal Formulation: With patient Time For Goal Achievement: 04/21/18 Potential to Achieve Goals: Fair    Frequency Min 3X/week   Barriers to  discharge        Co-evaluation               AM-PAC PT "6 Clicks" Mobility  Outcome Measure Help needed turning from your back to your side while in a flat bed without using bedrails?: A Lot Help needed moving from lying on your back to sitting on the side of a flat bed without using bedrails?: A Lot Help needed moving to and from a bed to a chair (including a wheelchair)?: A Lot Help needed standing up from a chair using your arms (e.g., wheelchair or bedside chair)?: A Lot Help needed to walk in hospital room?: A Lot Help needed climbing 3-5 steps with a railing? : Total 6 Click Score: 11    End of Session Equipment Utilized During Treatment: Gait belt Activity Tolerance: Patient tolerated treatment well Patient left: in bed;with call bell/phone within reach;with bed alarm set Nurse Communication: Mobility status PT Visit Diagnosis: Other abnormalities of gait and mobility (R26.89);Other symptoms and signs involving the nervous system (R29.898)    Time: 1638-4536 PT Time Calculation (min) (ACUTE ONLY): 31 min   Charges:   PT Evaluation $PT Eval Moderate Complexity: 1 Mod PT Treatments $Therapeutic Activity: 8-22 mins        Sherie Don, PT, DPT  Acute Rehabilitation Services Pager [phone] Office [place] 04/07/2018, 9:46 AM

## 2018-04-07 NOTE — Progress Notes (Signed)
[facility] for Coumadin Indication: h/o VTE  Allergies  Allergen Reactions  . Aspridrox [Aspirin Buf(Alhyd-Mghyd-Cacar)] Other (See Comments)    unknown  . Codeine Other (See Comments)    Unknown, also Tylenol with Codeine  . Darvocet [Propoxyphene N-Acetaminophen] Other (See Comments)    unknown  . Esomeprazole Magnesium Other (See Comments)    unknown  . Iodine Other (See Comments)    unknown  . Morphine And Related Other (See Comments)    unknown  . Sulfa Antibiotics Other (See Comments)    unknown  . Sulfate Other (See Comments)    unknown  . Tramadol Other (See Comments)    unknown    Patient Measurements: Height: 5\' 7"  (170.2 cm) Weight: 118 lb 9.7 oz (53.8 kg) IBW/kg (Calculated) : 61.6  Vital Signs: Temp: (P) 98 F (36.7 C) (04/02 0753) Temp Source: (P) Oral (04/02 0753) BP: (P) 156/67 (04/02 0753) Pulse Rate: (P) 68 (04/02 0753)  Labs: Recent Labs    04/06/18 1716 04/07/18 0538  HGB 10.3* 10.9*  HCT 34.5* 34.6*  PLT 200 226  LABPROT 28.9* 24.2*  INR 2.8* 2.2*  CREATININE 1.05*  --     Estimated Creatinine Clearance: 35.7 mL/min (A) (by C-G formula based on SCr of 1.05 mg/dL (H)).   Medical History: Past Medical History:  Diagnosis Date  . Anorexia   . Anxiety   . Blood transfusion without reported diagnosis    1970's  . Chronic diarrhea   . Clotting disorder ([place])   . Depression   . DVT (deep venous thrombosis) ([place])   . Endometrial ca ([place])   . Hiatal hernia   . History of D&C   . History of sinus surgery   . Hypokalemia   . Insomnia   . Multiple thyroid nodules 12/07   b/l nodules thyroid   . Neuropathy   . Pulmonary embolus ([place])   . Radiation Jan.3-Feb.6,2008   External Beam to pelvis  . Radiation [id number],62 and 27/2008   Intracavitary brachytherapy  . Seizure disorder [facility])     Assessment: 84yo female presents w/ confusion, dysphagia, and fever; to continue Coumadin for h/o VTE;  current INR at goal 2.2 on admit. Hg low but stable, plt wnl. No active bleed issues documented. PTA regimen verified with [facility] - 6mg  daily except 4mg  on Mon/Thurs. Last dose PTA unknown with medication reconciliation still pending.  Goal of Therapy:  INR 2-3   Plan:  Warfarin 4mg  PO x 1 dose tonight Daily INR Monitor CBC, s/sx bleeding  Elicia Lamp, PharmD, BCPS Please check AMION for all [place] contact numbers Clinical Pharmacist 04/07/2018 9:32 AM

## 2018-04-07 NOTE — Progress Notes (Signed)
ANTICOAGULATION CONSULT NOTE - Initial Consult  Pharmacy Consult for Coumadin Indication: h/o VTE  Allergies  Allergen Reactions  . Aspridrox [Aspirin Buf(Alhyd-Mghyd-Cacar)] Other (See Comments)    unknown  . Codeine Other (See Comments)    Unknown, also Tylenol with Codeine  . Darvocet [Propoxyphene N-Acetaminophen] Other (See Comments)    unknown  . Esomeprazole Magnesium Other (See Comments)    unknown  . Iodine Other (See Comments)    unknown  . Morphine And Related Other (See Comments)    unknown  . Sulfa Antibiotics Other (See Comments)    unknown  . Sulfate Other (See Comments)    unknown  . Tramadol Other (See Comments)    unknown    Patient Measurements: Height: 5\' 7"  (170.2 cm) Weight: 118 lb 9.7 oz (53.8 kg) IBW/kg (Calculated) : 61.6  Vital Signs: Temp: 99.5 F (37.5 C) (04/01 2342) Temp Source: Oral (04/01 2342) BP: 158/67 (04/01 2342) Pulse Rate: 77 (04/01 2213)  Labs: Recent Labs    04/06/18 1716  HGB 10.3*  HCT 34.5*  PLT 200  LABPROT 28.9*  INR 2.8*  CREATININE 1.05*    Estimated Creatinine Clearance: 35.7 mL/min (A) (by C-G formula based on SCr of 1.05 mg/dL (H)).   Medical History: Past Medical History:  Diagnosis Date  . Anorexia   . Anxiety   . Blood transfusion without reported diagnosis    1970's  . Chronic diarrhea   . Clotting disorder ([place])   . Depression   . DVT (deep venous thrombosis) ([place])   . Endometrial ca ([place])   . Hiatal hernia   . History of D&C   . History of sinus surgery   . Hypokalemia   . Insomnia   . Multiple thyroid nodules 12/07   b/l nodules thyroid   . Neuropathy   . Pulmonary embolus ([place])   . Radiation Jan.3-Feb.6,2008   External Beam to pelvis  . Radiation GOT.15,72 and 27/2008   Intracavitary brachytherapy  . Seizure disorder [facility])     Assessment: 84yo female presents w/ confusion, dysphagia, and fever; to continue Coumadin for h/o VTE; current INR at goal though Coumadin regimen and  last dose unclear.  Goal of Therapy:  INR 2-3   Plan:  Will clarify Coumadin regimen and monitor INR for dose adjustments.  Wynona Neat, PharmD, BCPS  04/07/2018,12:25 AM

## 2018-04-07 NOTE — Progress Notes (Addendum)
Subjective:   [name] was examined at bedside this morning and she was extremely pleasant. She re-iterated that she was admitted to the hospital because of a fall (a couple weeks ago) in her bathtub and difficulty with speech. Her episode of fall was witnessed by her son. She went on to explain that she couldn't express her words. She got upset while trying to find her jewelry and that was when her symptoms began. She denies any previous episode of dysarthria   Objective:  Vital signs in last 24 hours: Vitals:   04/06/18 2213 04/06/18 2342 04/07/18 0325 04/07/18 0753  BP: (!) 149/92 (!) 158/67 (!) 159/66 (!) 156/67  Pulse: 77  75 68  Resp: 18 18 18 18   Temp: 99.3 F (37.4 C) 99.5 F (37.5 C) 98.8 F (37.1 C) 98 F (36.7 C)  TempSrc:  Oral Oral Oral  SpO2: 100% 98% 98% 100%  Weight:  53.8 kg    Height:  5\' 7"  (1.702 m)  5\' 7"  (1.702 m)   Gen: pleasant, NAD, alert and oriented to self, place, and situation Neuro: Motor strength 5/5 bilaterally at the upper and lower extremities. Decreased sensation to light touch on Left cheek and LLE, otherwise sensation is intact. CN 2-12 intact. 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 The dysarthria has improved since onset, but is still present. She has no other neurological deficits.   Aphasia: Brain MRI negative for acute infarct and showed mild chronic small vessel ischemia,  less than expected for age. No metabolic derangements. Most likely 2/2 Parkinsonism vs. TIA. She continues to have expressive aphasia this morning. Parkinson's disease would explain her falls, pill roll tremor, cogwheel rigidity, and expressive aphasia. However, she denies being diagnosed with parkinson's disease. Will call [facility] to clarify medical history  -  Continue Tele - order echo - call [facility] - consult neuro, will defer further neuro imaging to them - Lipid panel: T. Chol 145, LDL 57 - a1c 5.2 - PT rec SNF, discussed with [facility] who states they will arrange dispo - f/u OT recs  Fever: remains afebrile since admission. No antibiotics or antipyretics have been administered here. Denies other infectious symptoms including cough and dysuria. CXR and UA are unremarkable. She is not altered. Low suspicion for infection, including covid.  - F/u urine culture - Monitor fever   DVT/PE on Warfarin - INR 2.8., spoke with [facility]. They recommended not changed warfarin dose.  - continue home warfarin: 4mg  Mon/Thurs, 6mg  all other days  Anxiety - Patient attributes her tremor to anxiety. States has been worked up for [employer], but does not have it. Takes ativan twice a day at home. - Continue home ativan 0.5 mg BID prn and mirtazepine 15mg  qhs  IBS-D: spoke with PCP who stated that [name] typically has a lot of trouble with her bowels and the PCP recommends to continue all home meds.  - continue  home cholestyramine, loperamide   Dispo: Anticipated discharge in approximately 0-1 day(s) pending neurology eval. [facility] aware of SNF recommendations and will arrange services on discharge.   Isabelle Course, MD 04/07/2018, 10:23 AM Pager: [phone]

## 2018-04-07 NOTE — Discharge Instructions (Signed)
Serotonin Syndrome °Serotonin is a chemical in your body (neurotransmitter) that helps to control several functions, such as: °· Brain and nerve cell function. °· Mood and emotions. °· Memory. °· Eating. °· Sleeping. °· Sexual activity. °· Stress response. °Having too much serotonin in your body can cause serotonin syndrome. This condition can be harmful to your brain and nerve cells. This can be a life-threatening condition. °What are the causes? °This condition may be caused by taking medicines or drugs that increase the level of serotonin in your body, such as: °· Antidepressant medicines. °· Migraine medicines. °· Certain pain medicines. °· Certain drugs, including ecstasy, LSD, cocaine, and amphetamines. °· Over-the-counter cough or cold medicines that contain dextromethorphan. °· Certain herbal supplements, including St. John's wort, ginseng, and nutmeg. °This condition usually occurs when you take these medicines or drugs in combination, but it can also happen with a high dose of a single medicine or drug. °What increases the risk? °You are more likely to develop this condition if: °· You just started taking a medicine or drug that increases the level of serotonin in the body. °· You recently increased the dose of a medicine or drug that increases the level of serotonin in the body. °· You take more than one medicine or drug that increases the level of serotonin in the body. °What are the signs or symptoms? °Symptoms of this condition usually start within several hours of taking a medicine or drug. Symptoms may be mild or severe. Mild symptoms include: °· Sweating. °· Restlessness or agitation. °· Muscle twitching or stiffness. °· Rapid heart rate. °· Nausea and vomiting. °· Diarrhea. °· Headache. °· Shivering or goose bumps. °· Confusion. °Severe symptoms include: °· Irregular heartbeat. °· Seizures. °· Loss of consciousness. °· High fever. °How is this diagnosed? °This condition may be diagnosed based  on: °· Your medical history.  °· A physical exam. °· Your prior use of drugs and medicines. °· Blood or urine tests. These may be used to rule out other causes of your symptoms. °How is this treated? °The treatment for this condition depends on the severity of your symptoms. °· For mild cases, stopping the medicine or drug that caused your condition is usually all that is needed. °· For moderate to severe cases, treatment in a hospital may be needed to prevent or manage life-threatening symptoms. This may include medicines to control your symptoms, IV fluids, interventions to support your breathing, and treatments to control your body temperature. °Follow these instructions at home: °Medicines ° °· Take over-the-counter and prescription medicines only as told by your health care provider. This is important. °· Check with your health care provider before you start taking any new prescriptions, over-the-counter medicines, herbs, or supplements. °· Avoid combining any medicines that can cause this condition to occur. °Lifestyle ° °· Maintain a healthy lifestyle. °? Eat a healthy diet that includes plenty of vegetables, fruits, whole grains, low-fat dairy products, and lean protein. Do not eat a lot of foods that are high in fat, added sugars, or salt. °? Get the right amount and quality of sleep. Most adults need 7-9 hours of sleep each night. °? Make time to exercise, even if it is only for short periods of time. Most adults should exercise for at least 150 minutes each week. °? Do not drink alcohol. °? Do not use illegal drugs, and do not take medicines for reasons other than they are prescribed. °General instructions °· Do not use any products that contain nicotine   or tobacco, such as cigarettes and e-cigarettes. If you need help quitting, ask your health care provider.  Keep all follow-up visits as told by your health care provider. This is important. Contact a health care provider if:  Your symptoms do not  improve or they get worse. Get help right away if you:  Have worsening confusion, severe headache, chest pain, high fever, seizures, or loss of consciousness.  Experience serious side effects of medicine, such as swelling of your face, lips, tongue, or throat.  Have serious thoughts about hurting yourself or others. These symptoms may represent a serious problem that is an emergency. Do not wait to see if the symptoms will go away. Get medical help right away. Call your local emergency services (911 in the [place] Do not drive yourself to the hospital. If you ever feel like you may hurt yourself or others, or have thoughts about taking your own life, get help right away. You can go to your nearest emergency department or call:  Your local emergency services (911 in the [place]  A suicide crisis helpline, such as the [facility] at 519-601-8548. This is open 24 hours a day. Summary  Serotonin is a brain chemical that helps to regulate the nervous system. High levels of serotonin in the body can cause serotonin syndrome, which is a very dangerous condition.  This condition may be caused by taking medicines or drugs that increase the level of serotonin in your body.  Treatment depends on the severity of your symptoms. For mild cases, stopping the medicine or drug that caused your condition is usually all that is needed.  Check with your health care provider before you start taking any new prescriptions, over-the-counter medicines, herbs, or supplements. This information is not intended to replace advice given to you by your health care provider. Make sure you discuss any questions you have with your health care provider. Document Released: 01/30/2004 Document Revised: 01/29/2017 Document Reviewed: 01/29/2017 Elsevier Interactive Patient Education  2019 Elsevier Inc. Buspirone tablets What is this medicine? BUSPIRONE (byoo SPYE rone) is used to treat anxiety  disorders. This medicine may be used for other purposes; ask your health care provider or pharmacist if you have questions. COMMON BRAND NAME(S): BuSpar What should I tell my health care provider before I take this medicine? They need to know if you have any of these conditions: -kidney or liver disease -an unusual or allergic reaction to buspirone, other medicines, foods, dyes, or preservatives -pregnant or trying to get pregnant -breast-feeding How should I use this medicine? Take this medicine by mouth with a glass of water. Follow the directions on the prescription label. You may take this medicine with or without food. To ensure that this medicine always works the same way for you, you should take it either always with or always without food. Take your doses at regular intervals. Do not take your medicine more often than directed. Do not stop taking except on the advice of your doctor or health care professional. Talk to your pediatrician regarding the use of this medicine in children. Special care may be needed. Overdosage: If you think you have taken too much of this medicine contact a poison control center or emergency room at once. NOTE: This medicine is only for you. Do not share this medicine with others. What if I miss a dose? If you miss a dose, take it as soon as you can. If it is almost time for your next dose, take only  that dose. Do not take double or extra doses. What may interact with this medicine? Do not take this medicine with any of the following medications: -linezolid -MAOIs like Carbex, Eldepryl, Marplan, Nardil, and Parnate -methylene blue -procarbazine This medicine may also interact with the following medications: -diazepam -digoxin -diltiazem -erythromycin -grapefruit juice -haloperidol -medicines for mental depression or mood problems -medicines for seizures like carbamazepine, phenobarbital and phenytoin -nefazodone -other medications for  anxiety -rifampin -ritonavir -some antifungal medicines like itraconazole, ketoconazole, and voriconazole -verapamil -warfarin This list may not describe all possible interactions. Give your health care provider a list of all the medicines, herbs, non-prescription drugs, or dietary supplements you use. Also tell them if you smoke, drink alcohol, or use illegal drugs. Some items may interact with your medicine. What should I watch for while using this medicine? Visit your doctor or health care professional for regular checks on your progress. It may take 1 to 2 weeks before your anxiety gets better. You may get drowsy or dizzy. Do not drive, use machinery, or do anything that needs mental alertness until you know how this drug affects you. Do not stand or sit up quickly, especially if you are an older patient. This reduces the risk of dizzy or fainting spells. Alcohol can make you more drowsy and dizzy. Avoid alcoholic drinks. What side effects may I notice from receiving this medicine? Side effects that you should report to your doctor or health care professional as soon as possible: -blurred vision or other vision changes -chest pain -confusion -difficulty breathing -feelings of hostility or anger -muscle aches and pains -numbness or tingling in hands or feet -ringing in the ears -skin rash and itching -vomiting -weakness Side effects that usually do not require medical attention (report to your doctor or health care professional if they continue or are bothersome): -disturbed dreams, nightmares -headache -nausea -restlessness or nervousness -sore throat and nasal congestion -stomach upset This list may not describe all possible side effects. Call your doctor for medical advice about side effects. You may report side effects to FDA at 1-800-FDA-1088. Where should I keep my medicine? Keep out of the reach of children. Store at room temperature below 30 degrees C (86 degrees F).  Protect from light. Keep container tightly closed. Throw away any unused medicine after the expiration date. NOTE: This sheet is a summary. It may not cover all possible information. If you have questions about this medicine, talk to your doctor, pharmacist, or health care provider.  2019 Elsevier/Gold Standard (2009-08-01 18:06:11)

## 2018-04-07 NOTE — TOC Transition Note (Signed)
Transition of Care [facility]) - CM/SW Discharge Note   Patient Details  Name: [name] MRN: [id number] Date of Birth: 1934/06/04  [facility] CM/SW Contact:  Pollie Friar, RN Phone Number: 04/07/2018, 2:36 PM   Clinical Narrative:    [facility] is going to arrange caregivers in the home and therapy for home. Pt's daughter Darrick Penna aware and in agreement. [facility] to arrange transport home.  Information [facility] requested faxed to them. They will look over her medications once received and deliver new meds to the home.  [facility] also states they will provide any needed DME.    Final next level of care: [place] services for aides and therapies) Barriers to Discharge: No Barriers Identified   Patient Goals and CMS Choice        Discharge Placement                       Discharge Plan and Services                          Social Determinants of Health (SDOH) Interventions     Readmission Risk Interventions No flowsheet data found.

## 2018-04-07 NOTE — Progress Notes (Signed)
  Speech Language Pathology  Patient Details Name: [name] MRN: [id number] DOB: 10-14-1934 Today's Date: 04/07/2018 Time:  -     Pt passed [facility] swallow screen. RN did not observe difficulty swallowing per report today. No orders given for speech-language assessment. Discharge summary has been signed. Does not require swallow assessment at this time.                 Brady, Adael Culbreath Willis 04/07/2018, 2:07 PM  Orbie Pyo Colvin Caroli.Ed Risk analyst [phone] Office [phone]

## 2018-04-07 NOTE — Progress Notes (Signed)
Patient being discharged home with family.  Iv removed with catheter intact. Discharge instructions given.

## 2019-08-11 ENCOUNTER — Other Ambulatory Visit: Payer: Self-pay | Admitting: Family Medicine

## 2019-08-11 DIAGNOSIS — I69354 Hemiplegia and hemiparesis following cerebral infarction affecting left non-dominant side: Secondary | ICD-10-CM

## 2019-08-11 DIAGNOSIS — R258 Other abnormal involuntary movements: Secondary | ICD-10-CM

## 2019-08-11 DIAGNOSIS — I739 Peripheral vascular disease, unspecified: Secondary | ICD-10-CM

## 2019-08-14 ENCOUNTER — Other Ambulatory Visit: Payer: Self-pay | Admitting: Family Medicine

## 2019-08-14 DIAGNOSIS — H532 Diplopia: Secondary | ICD-10-CM

## 2019-09-06 ENCOUNTER — Other Ambulatory Visit: Payer: Medicare (Managed Care)

## 2019-09-28 ENCOUNTER — Other Ambulatory Visit: Payer: Self-pay

## 2019-09-28 ENCOUNTER — Ambulatory Visit
Admission: RE | Admit: 2019-09-28 | Discharge: 2019-09-28 | Disposition: A | Discharge status: Home / Self Care | Payer: Medicare (Managed Care) | Source: Ambulatory Visit | Attending: Family Medicine | Admitting: Family Medicine

## 2019-09-28 DIAGNOSIS — H532 Diplopia: Secondary | ICD-10-CM

## 2019-09-28 DIAGNOSIS — I739 Peripheral vascular disease, unspecified: Secondary | ICD-10-CM

## 2019-09-28 DIAGNOSIS — R258 Other abnormal involuntary movements: Secondary | ICD-10-CM

## 2019-09-28 DIAGNOSIS — I69354 Hemiplegia and hemiparesis following cerebral infarction affecting left non-dominant side: Secondary | ICD-10-CM

## 2019-12-18 ENCOUNTER — Other Ambulatory Visit: Payer: Medicare (Managed Care)

## 2020-02-23 ENCOUNTER — Ambulatory Visit
Admission: RE | Admit: 2020-02-23 | Discharge: 2020-02-23 | Disposition: A | Discharge status: Home / Self Care | Payer: No Typology Code available for payment source | Source: Ambulatory Visit | Attending: Family Medicine | Admitting: Family Medicine

## 2020-02-23 ENCOUNTER — Other Ambulatory Visit: Payer: Self-pay | Admitting: Family Medicine

## 2020-02-23 ENCOUNTER — Other Ambulatory Visit: Payer: Self-pay

## 2020-02-23 DIAGNOSIS — K219 Gastro-esophageal reflux disease without esophagitis: Secondary | ICD-10-CM

## 2020-02-23 DIAGNOSIS — R64 Cachexia: Secondary | ICD-10-CM

## 2020-06-05 ENCOUNTER — Other Ambulatory Visit: Payer: Self-pay | Admitting: Vascular Surgery

## 2020-06-05 DIAGNOSIS — Z0000 Encounter for general adult medical examination without abnormal findings: Secondary | ICD-10-CM

## 2020-06-05 DIAGNOSIS — M810 Age-related osteoporosis without current pathological fracture: Secondary | ICD-10-CM

## 2020-06-05 DIAGNOSIS — Z8542 Personal history of malignant neoplasm of other parts of uterus: Secondary | ICD-10-CM

## 2020-06-18 ENCOUNTER — Ambulatory Visit (HOSPITAL_COMMUNITY): Payer: Medicare (Managed Care)

## 2020-07-01 ENCOUNTER — Ambulatory Visit (HOSPITAL_COMMUNITY)
Admission: RE | Admit: 2020-07-01 | Discharge: 2020-07-01 | Disposition: A | Discharge status: Home / Self Care | Payer: Medicare (Managed Care) | Source: Ambulatory Visit | Attending: Family Medicine | Admitting: Family Medicine

## 2020-07-01 ENCOUNTER — Other Ambulatory Visit: Payer: Self-pay

## 2020-07-01 DIAGNOSIS — I69354 Hemiplegia and hemiparesis following cerebral infarction affecting left non-dominant side: Secondary | ICD-10-CM | POA: Diagnosis not present

## 2020-07-01 DIAGNOSIS — I739 Peripheral vascular disease, unspecified: Secondary | ICD-10-CM | POA: Insufficient documentation

## 2020-07-01 DIAGNOSIS — R258 Other abnormal involuntary movements: Secondary | ICD-10-CM | POA: Insufficient documentation

## 2020-07-01 DIAGNOSIS — H532 Diplopia: Secondary | ICD-10-CM | POA: Diagnosis present

## 2020-07-01 MED ORDER — GADOBUTROL 1 MMOL/ML IV SOLN
5.5000 mL | Freq: Once | INTRAVENOUS | Status: AC | PRN
  Administered 2020-07-01: 5.5 mL via INTRAVENOUS

## 2020-07-03 ENCOUNTER — Other Ambulatory Visit: Payer: Self-pay | Admitting: Internal Medicine

## 2021-04-20 ENCOUNTER — Emergency Department (HOSPITAL_COMMUNITY)
Admission: EM | Admit: 2021-04-20 | Discharge: 2021-04-22 | Disposition: A | Discharge status: Home / Self Care | Payer: Medicare (Managed Care) | Attending: Emergency Medicine | Admitting: Emergency Medicine

## 2021-04-20 ENCOUNTER — Other Ambulatory Visit: Payer: Self-pay

## 2021-04-20 ENCOUNTER — Emergency Department (HOSPITAL_COMMUNITY): Payer: Medicare (Managed Care)

## 2021-04-20 DIAGNOSIS — R1013 Epigastric pain: Secondary | ICD-10-CM | POA: Diagnosis not present

## 2021-04-20 DIAGNOSIS — R197 Diarrhea, unspecified: Secondary | ICD-10-CM | POA: Insufficient documentation

## 2021-04-20 DIAGNOSIS — Z20822 Contact with and (suspected) exposure to covid-19: Secondary | ICD-10-CM | POA: Diagnosis not present

## 2021-04-20 DIAGNOSIS — K529 Noninfective gastroenteritis and colitis, unspecified: Secondary | ICD-10-CM

## 2021-04-20 DIAGNOSIS — D649 Anemia, unspecified: Secondary | ICD-10-CM | POA: Diagnosis not present

## 2021-04-20 DIAGNOSIS — R1031 Right lower quadrant pain: Secondary | ICD-10-CM | POA: Diagnosis not present

## 2021-04-20 DIAGNOSIS — R300 Dysuria: Secondary | ICD-10-CM | POA: Diagnosis not present

## 2021-04-20 DIAGNOSIS — R262 Difficulty in walking, not elsewhere classified: Secondary | ICD-10-CM | POA: Insufficient documentation

## 2021-04-20 DIAGNOSIS — R7989 Other specified abnormal findings of blood chemistry: Secondary | ICD-10-CM | POA: Insufficient documentation

## 2021-04-20 DIAGNOSIS — Z7901 Long term (current) use of anticoagulants: Secondary | ICD-10-CM | POA: Insufficient documentation

## 2021-04-20 LAB — URINALYSIS, ROUTINE W REFLEX MICROSCOPIC
Bilirubin Urine: NEGATIVE
Glucose, UA: NEGATIVE mg/dL
Ketones, ur: NEGATIVE mg/dL
Nitrite: NEGATIVE
Protein, ur: NEGATIVE mg/dL
Specific Gravity, Urine: 1.011 (ref 1.005–1.030)
pH: 7 (ref 5.0–8.0)

## 2021-04-20 LAB — COMPREHENSIVE METABOLIC PANEL
ALT: 15 U/L (ref 0–44)
AST: 18 U/L (ref 15–41)
Albumin: 3.7 g/dL (ref 3.5–5.0)
Alkaline Phosphatase: 64 U/L (ref 38–126)
Anion gap: 3 — ABNORMAL LOW (ref 5–15)
BUN: 30 mg/dL — ABNORMAL HIGH (ref 8–23)
CO2: 27 mmol/L (ref 22–32)
Calcium: 9.6 mg/dL (ref 8.9–10.3)
Chloride: 111 mmol/L (ref 98–111)
Creatinine, Ser: 1.47 mg/dL — ABNORMAL HIGH (ref 0.44–1.00)
GFR, Estimated: 35 mL/min — ABNORMAL LOW (ref >60)
Glucose, Bld: 95 mg/dL (ref 70–99)
Potassium: 4.4 mmol/L (ref 3.5–5.1)
Sodium: 141 mmol/L (ref 135–145)
Total Bilirubin: 0.6 mg/dL (ref 0.3–1.2)
Total Protein: 6.7 g/dL (ref 6.5–8.1)

## 2021-04-20 LAB — CBC
HCT: 34.3 % — ABNORMAL LOW (ref 36.0–46.0)
Hemoglobin: 10.9 g/dL — ABNORMAL LOW (ref 12.0–15.0)
MCH: 31.4 pg (ref 26.0–34.0)
MCHC: 31.8 g/dL (ref 30.0–36.0)
MCV: 98.8 fL (ref 80.0–100.0)
Platelets: 198 10*3/uL (ref 150–400)
RBC: 3.47 MIL/uL — ABNORMAL LOW (ref 3.87–5.11)
RDW: 12.2 % (ref 11.5–15.5)
WBC: 5.2 10*3/uL (ref 4.0–10.5)
nRBC: 0 % (ref 0.0–0.2)

## 2021-04-20 LAB — LIPASE, BLOOD: Lipase: 29 U/L (ref 11–51)

## 2021-04-20 MED ORDER — OLANZAPINE 5 MG PO TABS
5.0000 mg | ORAL_TABLET | Freq: Every day | ORAL | Status: DC
  Administered 2021-04-21: 5 mg via ORAL
  Filled 2021-04-20: qty 1

## 2021-04-20 MED ORDER — MIRTAZAPINE 7.5 MG PO TABS
7.5000 mg | ORAL_TABLET | Freq: Every day | ORAL | Status: DC
  Administered 2021-04-21: 7.5 mg via ORAL
  Filled 2021-04-20: qty 1

## 2021-04-20 MED ORDER — APIXABAN 2.5 MG PO TABS
2.5000 mg | ORAL_TABLET | Freq: Two times a day (BID) | ORAL | Status: DC
  Administered 2021-04-21 – 2021-04-22 (×2): 2.5 mg via ORAL
  Filled 2021-04-20 (×4): qty 1

## 2021-04-20 MED ORDER — SODIUM CHLORIDE 0.9 % IV BOLUS
1000.0000 mL | Freq: Once | INTRAVENOUS | Status: AC
  Administered 2021-04-20: 1000 mL via INTRAVENOUS

## 2021-04-20 MED ORDER — LOPERAMIDE HCL 2 MG PO CAPS
2.0000 mg | ORAL_CAPSULE | Freq: Once | ORAL | Status: AC
  Administered 2021-04-20: 2 mg via ORAL
  Filled 2021-04-20: qty 1

## 2021-04-20 MED ORDER — VITAMIN B-12 100 MCG PO TABS
100.0000 ug | ORAL_TABLET | Freq: Every day | ORAL | Status: DC
  Administered 2021-04-20: 100 ug via ORAL
  Filled 2021-04-20 (×3): qty 1

## 2021-04-20 MED ORDER — CARBIDOPA-LEVODOPA 25-100 MG PO TABS
1.0000 | ORAL_TABLET | Freq: Three times a day (TID) | ORAL | Status: DC
  Administered 2021-04-20 – 2021-04-22 (×4): 1 via ORAL
  Filled 2021-04-20 (×7): qty 1

## 2021-04-20 MED ORDER — PANTOPRAZOLE SODIUM 40 MG PO TBEC
40.0000 mg | DELAYED_RELEASE_TABLET | Freq: Every day | ORAL | Status: DC
  Administered 2021-04-20 – 2021-04-22 (×2): 40 mg via ORAL
  Filled 2021-04-20 (×4): qty 1

## 2021-04-20 MED ORDER — CHOLESTYRAMINE LIGHT 4 G PO PACK
4.0000 g | PACK | Freq: Every day | ORAL | Status: DC
  Administered 2021-04-22: 4 g via ORAL
  Filled 2021-04-20 (×3): qty 1

## 2021-04-20 MED ORDER — ACETAMINOPHEN 325 MG PO TABS: 650.0000 mg | ORAL_TABLET | Freq: Four times a day (QID) | ORAL | Status: DC | PRN

## 2021-04-20 MED ORDER — VITAMIN D 25 MCG (1000 UNIT) PO TABS
1000.0000 [IU] | ORAL_TABLET | Freq: Every day | ORAL | Status: DC
  Administered 2021-04-20 – 2021-04-22 (×2): 1000 [IU] via ORAL
  Filled 2021-04-20 (×4): qty 1

## 2021-04-20 MED ORDER — ESCITALOPRAM OXALATE 10 MG PO TABS
10.0000 mg | ORAL_TABLET | Freq: Every day | ORAL | Status: DC
  Administered 2021-04-20 – 2021-04-22 (×2): 10 mg via ORAL
  Filled 2021-04-20 (×4): qty 1

## 2021-04-20 MED ORDER — CALCIUM CARBONATE ANTACID 500 MG PO CHEW: 1.0000 | CHEWABLE_TABLET | Freq: Four times a day (QID) | ORAL | Status: DC | PRN

## 2021-04-20 MED ORDER — LOPERAMIDE HCL 2 MG PO CAPS
2.0000 mg | ORAL_CAPSULE | ORAL | Status: DC | PRN
  Filled 2021-04-20: qty 1

## 2021-04-20 MED ORDER — ENSURE SURGERY PO LIQD
237.0000 mL | Freq: Three times a day (TID) | ORAL | Status: DC | PRN
  Filled 2021-04-20: qty 237

## 2021-04-20 NOTE — ED Provider Notes (Signed)
?[facility] DEPT ?Provider Note ? ? ?CSN: [id number] ?Arrival date & time: 04/20/21  1039 ? ?  ? ?History ? ?Chief Complaint  ?Patient presents with  ? Diarrhea  ? Abdominal Pain  ? ? ?[name] is a 87 y.o. female with a PMG significant for previous PE, DVT, chronic diarrhea secondary to history of IBS. Patient arrives via EMS after pressing her life alert button. EMS reports patient with multiple briefs full of feces. Patient reports that her son lives with her, on collateral history later, son denies this and reports that mother had been living at home alone and that he can only check on her occasionally due to full time job. She reports some abdominal pain associated with her diarrhea. She denies chest pain, SHOB, dysuria, fever, chills. She has not missed any doses of her anticoagulant (Eliquis). She is covered in her medical care by [facility]. ? ? ?Diarrhea ?Associated symptoms: abdominal pain   ?Abdominal Pain ?Associated symptoms: diarrhea   ? ?  ? ?Home Medications ?Prior to Admission medications   ?Medication Sig Start Date End Date Taking? Authorizing Provider  ?acetaminophen (TYLENOL) 650 MG CR tablet Take 650 mg by mouth every 8 (eight) hours as needed for pain.   Yes [provider]  ?apixaban (ELIQUIS) 2.5 MG TABS tablet Take 2.5 mg by mouth 2 (two) times daily.   Yes [provider]  ?calcium carbonate (TUMS EX) 750 MG chewable tablet Chew 750 mg by mouth daily as needed for heartburn.   Yes [provider]  ?carbidopa-levodopa (SINEMET IR) 25-100 MG tablet Take 0.5-1 tablets by mouth See admin instructions. One tablet in the am @ 0900 and 0.5 tablet at 1700 and 1 tablet @ 2000   Yes [provider]  ?cholecalciferol (VITAMIN D) 1000 UNITS tablet Take 1,000 Units by mouth daily.   Yes [provider]  ?cholestyramine (QUESTRAN) 4 g packet Take 1 packet (4 g total) by mouth daily. Take 1.5 hours away from other medications.  03/11/16  Yes Zehr, Laban Emperor, PA-C  ?Cyanocobalamin (VITAMIN B 12) 100 MCG LOZG Take 1,000 mcg by mouth daily.   Yes [provider]  ?ENSURE PLUS (ENSURE PLUS) LIQD Take 237 mLs by mouth daily as needed (nutrition supplement).   Yes [provider]  ?escitalopram (LEXAPRO) 5 MG tablet Take 1 tablet (5 mg total) by mouth daily. ?Patient taking differently: Take 10 mg by mouth daily. 04/08/18  Yes Isabelle Course, MD  ?loperamide (IMODIUM) 2 MG capsule Take 2 mg by mouth 2 (two) times daily as needed for diarrhea or loose stools.    Yes [provider]  ?Menthol, Topical Analgesic, 4 % GEL Apply 3.5 % topically 2 (two) times daily as needed (pain).   Yes [provider]  ?mirtazapine (REMERON) 7.5 MG tablet Take 1 tablet (7.5 mg total) by mouth at bedtime as needed (sleep). ?Patient taking differently: Take 7.5 mg by mouth at bedtime. 04/07/18  Yes Isabelle Course, MD  ?OLANZapine (ZYPREXA) 5 MG tablet Take 5 mg by mouth at bedtime.   Yes [provider]  ?omeprazole (PRILOSEC) 20 MG capsule Take 20 mg by mouth daily.   Yes [provider]  ?   ? ?Allergies    ?Aspridrox [aspirin buf(alhyd-mghyd-cacar)], Codeine, Darvocet [propoxyphene n-acetaminophen], Esomeprazole magnesium, Iodine, Morphine and related, Sulfa antibiotics, Sulfate, and Tramadol   ? ?Review of Systems   ?Review of Systems  ?Gastrointestinal:  Positive for abdominal pain and diarrhea.  ?  All other systems reviewed and are negative. ? ?Physical Exam ?Updated Vital Signs ?BP 121/73   Pulse 67   Temp 97.6 ?F (36.4 ?C) (Oral)   Resp 18   Ht '5\' 7"'$  (1.702 m)   Wt 49.9 kg   SpO2 99%   BMI 17.23 kg/m?  ?Physical Exam ?Vitals and nursing note reviewed.  ?Constitutional:   ?   General: She is not in acute distress. ?   Appearance: Normal appearance.  ?   Comments: Patient very thin, somewhat malnourished appearing. No acute distress. Non-toxic appearance.  ?HENT:  ?   Head: Normocephalic and atraumatic.   ?Eyes:  ?   General:     ?   Right eye: No discharge.     ?   Left eye: No discharge.  ?Cardiovascular:  ?   Rate and Rhythm: Normal rate and regular rhythm.  ?   Heart sounds: No murmur heard. ?  No friction rub. No gallop.  ?Pulmonary:  ?   Effort: Pulmonary effort is normal.  ?   Breath sounds: Normal breath sounds.  ?Abdominal:  ?   General: Bowel sounds are normal.  ?   Palpations: Abdomen is soft.  ?   Comments: Some tenderness to palpation without rebound, rigidity, guarding in the epigastric region, as well as RLQ. Normal bowel sounds throughout.  ?Skin: ?   General: Skin is warm and dry.  ?   Capillary Refill: Capillary refill takes less than 2 seconds.  ?Neurological:  ?   Mental Status: She is alert and oriented to person, place, and time.  ?Psychiatric:     ?   Mood and Affect: Mood normal.     ?   Behavior: Behavior normal.  ? ? ?ED Results / Procedures / Treatments   ?Labs ?(all labs ordered are listed, but only abnormal results are displayed) ?Labs Reviewed  ?COMPREHENSIVE METABOLIC PANEL - Abnormal; Notable for the following components:  ?    Result Value  ? BUN 30 (*)   ? Creatinine, Ser 1.47 (*)   ? GFR, Estimated 35 (*)   ? Anion gap 3 (*)   ? All other components within normal limits  ?CBC - Abnormal; Notable for the following components:  ? RBC 3.47 (*)   ? Hemoglobin 10.9 (*)   ? HCT 34.3 (*)   ? All other components within normal limits  ?URINALYSIS, ROUTINE W REFLEX MICROSCOPIC - Abnormal; Notable for the following components:  ? APPearance HAZY (*)   ? Hgb urine dipstick SMALL (*)   ? Leukocytes,Ua SMALL (*)   ? Bacteria, UA RARE (*)   ? All other components within normal limits  ?RESP PANEL BY RT-PCR (FLU A&B, COVID) [id number]  ?LIPASE, BLOOD  ? ? ?EKG ?None ? ?Radiology ?CT ABDOMEN PELVIS WO CONTRAST ? ?Result Date: 04/20/2021 ?CLINICAL DATA:  Abdominal pain.  Diarrhea. EXAM: CT ABDOMEN AND PELVIS WITHOUT CONTRAST TECHNIQUE: Multidetector CT imaging of the abdomen and pelvis was performed  following the standard protocol without IV contrast. RADIATION DOSE REDUCTION: This exam was performed according to the departmental dose-optimization program which includes automated exposure control, adjustment of the mA and/or kV according to patient size and/or use of iterative reconstruction technique. COMPARISON:  09/09/2016. FINDINGS: Lower chest: Mild dependent lung base opacities consistent with atelectasis. No acute findings. Hepatobiliary: Liver normal in size and attenuation. No masses. Multiple calcifications consistent with healed granuloma. Status post cholecystectomy. No bile duct dilation. Pancreas: No pancreatic mass or inflammation. Dilated duct to  4 mm similar to the prior CT. Spleen: Normal in size without focal abnormality. Adrenals/Urinary Tract: No adrenal masses. Kidneys normal in size, orientation and position. No renal masses, stones or hydronephrosis. Ureters not well visualized, but no convincing ureteral dilation or stone. Bladder is unremarkable. Stomach/Bowel: Stomach is unremarkable. Small bowel and colon are normal in caliber. No wall thickening. No inflammation. No evidence of appendicitis. Vascular/Lymphatic: Aortic atherosclerosis. No aneurysm. Vena cava filter, tip at the level of the right renal vein. No enlarged lymph nodes. Reproductive: Status post hysterectomy. No adnexal masses. Other: No abdominal wall hernia. Trace amount of free fluid in the pelvis, nonspecific. Musculoskeletal: No fracture or acute finding.  No bone lesion. IMPRESSION: 1. No acute findings within the abdomen or pelvis. No evidence of bowel obstruction or inflammation. 2. Aortic atherosclerosis. Electronically Signed   By: Lajean Manes M.D.   On: 04/20/2021 12:16   ? ?Procedures ?Procedures  ? ? ?Medications Ordered in ED ?Medications  ?cholestyramine light (PREVALITE) packet 4 g (has no administration in time range)  ?escitalopram (LEXAPRO) tablet 10 mg (10 mg Oral Given 04/20/21 1720)  ?mirtazapine  (REMERON) tablet 7.5 mg (has no administration in time range)  ?acetaminophen (TYLENOL) tablet 650 mg (has no administration in time range)  ?apixaban (ELIQUIS) tablet 2.5 mg (has no administration in time range)  ?calc

## 2021-04-20 NOTE — ED Notes (Signed)
Magnesium 500 mg bid ? ?

## 2021-04-20 NOTE — ED Notes (Signed)
Pt refused all medications.  Stating "I won't take them"  Pt states she would like her daughter to come back and she wants to go home "I'll walk if I have to"  Pt reassured that she should get some rest and her daughter will be back in the morning.  ? ?

## 2021-04-20 NOTE — ED Triage Notes (Signed)
Pt BIBA from home. Pt pressed life alert button. EMS arrived to pt in bathroom Pt c/o dizziness, abd px and diarrhea beginning today. EMS found evidence of several briefs full of feces. Pt unsure of the last time someone had checked on them.  ? ?Pt given NS en route/ ? ?Hx DVT (takes Eliquis), left leg swollen- EMS told that is typical fore baseline. ? ?Hx IBS w/diarrhea ? ?Aox4, but exhibits some confusion regarding timelines- typical for baseline. ? ? ? ?

## 2021-04-21 LAB — RESP PANEL BY RT-PCR (FLU A&B, COVID) ARPGX2
Influenza A by PCR: NEGATIVE
Influenza B by PCR: NEGATIVE
SARS Coronavirus 2 by RT PCR: NEGATIVE

## 2021-04-21 MED ORDER — LOPERAMIDE HCL 2 MG PO CAPS
4.0000 mg | ORAL_CAPSULE | Freq: Two times a day (BID) | ORAL | Status: DC | PRN
  Administered 2021-04-21: 4 mg via ORAL
  Filled 2021-04-21: qty 2

## 2021-04-21 NOTE — Evaluation (Addendum)
?Physical Therapy Evaluation ?Patient Details ?Name: [name] ?MRN: [id number] ?DOB: 09-Jun-1934 ?Today's Date: 04/21/2021 ? ?History of Present Illness ? [name] is a 87 y.o. female with a PMH  PE, DVT, chronic diarrhea secondary to history of IBS. Patient arrives via EMS 04/20/21 after pressing her life alert button.EMS reports patient with multiple briefs full of feces. patient has not been able to care for self.  ?Clinical Impression ? The patient  is very sweet, has been  marginally managing  in home, has been living with a son who works. ,patient mostly alone. Per daughter, patient has [name], attends day care 3x/week. ?Patient currently demonstrates decreased ability to care for self . Patient is  ambulatory PTA. Patient  will benefit from short rehab stay  to improve safety and strength. ?Pt admitted with above diagnosis.  Pt currently with functional limitations due to the deficits listed below (see PT Problem List). Pt will benefit from skilled PT to increase their independence and safety with mobility to allow discharge to the venue listed below.   ? ?   ? ?Recommendations for follow up therapy are one component of a multi-disciplinary discharge planning process, led by the attending physician.  Recommendations may be updated based on patient status, additional functional criteria and insurance authorization. ? ?Follow Up Recommendations Skilled nursing-short term rehab (<3 hours/day) vs 24/7 assist /HHPT ? ?  ?Assistance Recommended at Discharge Frequent or constant Supervision/Assistance  ?Patient can return home with the following ? A little help with walking and/or transfers;A little help with bathing/dressing/bathroom;Help with stairs or ramp for entrance;Assistance with cooking/housework;Assist for transportation ? ?  ?Equipment Recommendations None recommended by PT  ?Recommendations for Other Services ?    ?  ?Functional Status Assessment    ? ?  ?Precautions / Restrictions  Precautions ?Precautions: Fall  ? ?  ? ?Mobility ? Bed Mobility ?Overal bed mobility: Needs Assistance ?Bed Mobility: Supine to Sit, Sit to Supine ?  ?  ?Supine to sit: Min assist ?Sit to supine: Min assist ?  ?General bed mobility comments: patient required  cues for safety, impulsive at times. ?  ? ?Transfers ?Overall transfer level: Needs assistance ?Equipment used: Rolling walker (2 wheels) ?Transfers: Sit to/from Stand ?Sit to Stand: Min assist ?  ?  ?  ?  ?  ?General transfer comment: steady assist, patient's feet sliding forward until able to shift weight forward and over ?  ? ?Ambulation/Gait ?Ambulation/Gait assistance: Mod assist ?Gait Distance (Feet): 20 Feet ?Assistive device: Rolling walker (2 wheels) ?Gait Pattern/deviations: Step-to pattern, Step-through pattern, Staggering right, Staggering left ?  ?  ?  ?General Gait Details: cues for safety. ? ?Stairs ?  ?  ?  ?  ?  ? ?Wheelchair Mobility ?  ? ?Modified Rankin (Stroke Patients Only) ?  ? ?  ? ?Balance Overall balance assessment: Needs assistance ?Sitting-balance support: Bilateral upper extremity supported, Feet unsupported ?Sitting balance-Leahy Scale: Fair ?  ?  ?Standing balance support: During functional activity, Bilateral upper extremity supported ?Standing balance-Leahy Scale: Poor ?Standing balance comment: relaint on support ?  ?  ?  ?  ?  ?  ?  ?  ?  ?  ?  ?   ? ? ? ?Pertinent Vitals/Pain Pain Assessment ?Pain Assessment: No/denies pain  ? ? ?Home Living Family/patient expects to be discharged to:: Private residence ?Living Arrangements: Children ?Available Help at Discharge: Family;Personal care attendant ?Type of Home: House ?  ?  ?  ?  ?  Home Layout: One level ?  ?Additional Comments: per chart, patient  is covered by [name], attends Day  center 3 x /week, gets picked  to go, has medicatio planned for her, has been living with son who is limited  in support per daughter  ?  ?Prior Function Prior Level of Function :  Independent/Modified Independent ?  ?  ?  ?  ?  ?  ?Mobility Comments: ambulates with a RW ?  ?  ? ? ?Hand Dominance  ? Dominant Hand: Right ? ?  ?Extremity/Trunk Assessment  ? Upper Extremity Assessment ?Upper Extremity Assessment: Generalized weakness ?  ? ?Lower Extremity Assessment ?Lower Extremity Assessment: Generalized weakness ?  ? ?Cervical / Trunk Assessment ?Cervical / Trunk Assessment: Kyphotic  ?Communication  ? Communication: Expressive difficulties (daughter reports this has worsened)  ?Cognition Arousal/Alertness: Awake/alert ?Behavior During Therapy: [facility] for tasks assessed/performed ?Overall Cognitive Status: Impaired/Different from baseline ?Area of Impairment: Orientation, Following commands, Safety/judgement ?  ?  ?  ?  ?  ?  ?  ?  ?Orientation Level: Place, Time, Situation ?  ?  ?Following Commands: Follows one step commands inconsistently ?Safety/Judgement: Decreased awareness of deficits ?  ?  ?General Comments: per daughter, patient has declined over past weeks, patient did press her life alert ?  ?  ? ?  ?General Comments   ? ?  ?Exercises    ? ?Assessment/Plan  ?  ?PT Assessment Patient needs continued PT services  ?PT Problem List Decreased strength;Decreased mobility;Decreased safety awareness;Decreased knowledge of precautions;Decreased cognition;Decreased activity tolerance;Decreased knowledge of use of DME;Decreased balance ? ?   ?  ?PT Treatment Interventions DME instruction;Therapeutic activities;Cognitive remediation;Gait training;Therapeutic exercise;Functional mobility training;Balance training   ? ?PT Goals (Current goals can be found in the Care Plan section)  ?Acute Rehab PT Goals ?Patient Stated Goal: Per daughter  to go to rehab to improve ?PT Goal Formulation: With patient/family ?Time For Goal Achievement: 05/05/21 ?Potential to Achieve Goals: Fair ? ?  ?Frequency Min 2X/week ?  ? ? ?Co-evaluation   ?  ?  ?  ?  ? ? ?  ?AM-PAC PT "6 Clicks" Mobility  ?Outcome Measure Help  needed turning from your back to your side while in a flat bed without using bedrails?: A Little ?Help needed moving from lying on your back to sitting on the side of a flat bed without using bedrails?: A Little ?Help needed moving to and from a bed to a chair (including a wheelchair)?: A Lot ?Help needed standing up from a chair using your arms (e.g., wheelchair or bedside chair)?: A Lot ?Help needed to walk in hospital room?: A Lot ?Help needed climbing 3-5 steps with a railing? : Total ?6 Click Score: 13 ? ?  ?End of Session Equipment Utilized During Treatment: Gait belt ?Activity Tolerance: Patient tolerated treatment well ?Patient left: in bed;with call bell/phone within reach;with bed alarm set;with family/visitor present ?Nurse Communication: Mobility status ?PT Visit Diagnosis: Unsteadiness on feet (R26.81);Difficulty in walking, not elsewhere classified (R26.2) ?  ? ?Time: 3295-1884 ?PT Time Calculation (min) (ACUTE ONLY): 26 min ? ? ?Charges:   PT Evaluation ?$PT Eval Low Complexity: 1 Low ?PT Treatments ?$Gait Training: 8-22 mins ?  ?   ? ? ?Tresa Endo PT ?Acute Rehabilitation Services ?Pager [phone] ?Office [phone] ? ? ?Katharine Rochefort, Shella Maxim ?04/21/2021, 2:14 PM ?

## 2021-04-21 NOTE — ED Provider Notes (Signed)
Emergency Medicine Observation Re-evaluation Note ? ?[name] is a 87 y.o. female, seen on rounds today.  Pt initially presented to the ED for complaints of Diarrhea and Abdominal Pain ?Currently, the patient is resting. ? ?Physical Exam  ?BP (!) 167/77   Pulse 69   Temp 97.6 ?F (36.4 ?C) (Oral)   Resp 16   Ht '5\' 7"'$  (1.702 m)   Wt 49.9 kg   SpO2 93%   BMI 17.23 kg/m?  ?Physical Exam ?General: no acute distress ?Cardiac: normal HR ?Lungs: normal effort ? ? ?ED Course / MDM  ?EKG:  ? ?I have reviewed the labs performed to date as well as medications administered while in observation.  Recent changes in the last 24 hours include home meds being ordered. ? ?Plan  ?Current plan is for SNF placement. ? [name] is not under involuntary commitment. ? ? ?  ?Sherwood Gambler, MD ?04/21/21 1356 ? ?

## 2021-04-21 NOTE — Progress Notes (Signed)
CSW spoke with [facility] SW , she reported pt will d/c to [facility] tomorrow morning at 10 am. She reported pt's daughter will provide transportation to facility. ? ? ?Arlie Solomons.Migel Hannis, MSW, [place] ?[facility]  Transitions of Care ?Clinical Social Worker I ?Direct Dial: [phone]  Fax: [phone] ?[email or web address]   ?

## 2021-04-21 NOTE — Progress Notes (Signed)
.  Transition of Care [facility]) - Emergency Department Mini Assessment ? ? ?Patient Details  ?Name: [name] ?MRN: [id number] ?Date of Birth: 09-13-1934 ? ?Transition of Care (TOC) CM/SW Contact:    ?Arlie Solomons Shannan Garfinkel, LCSW ?Phone Number: ?04/21/2021, 8:27 AM ? ? ?Clinical Narrative: ? ?TOC CSW received consult in regards to family no wanting pt to d/c back home, due to pt living alone. CSW attempted to contact pt's son Kyera Felan [phone]) no response left HIPPA compliant VM requesting return call.  ? ?9:15 am  ?CSW spoke with pt's daughter Darrick Penna, she reported [facility] is working on LTC placement for pt. Pt's daughter reported she was told pt can remain in the ED while placement was found. CSW informed pt's daughter pt would not be able to board in ED while waiting on placement, as this is a lengthy process that should take place in the community setting. Pt's daughter reported [facility] has a placement for pt today and requested CSW to speak with [facility] SW in regards to that placement.  ? ?CSW attempted to contact [place] no response left HIPPA complaint VM requesting a return call. ? ?11:09am ?CSW spoke with pt's daughter to reminded her CSW informed pt's daughter the pt is up for discharge and can not wait in ER for LTC placement to be found. Pt's daughter stated she was told by [facility] not to speak with this Probation officer.  ? ?CSW attempted to call [facility] several times to get information on where they are with placement. CSW left VM with [facility] [phone]) SW and RN requesting return calls. Pt's daughter is refusing to make plan for pt's d/c.  ? ?12:35pm  ?CSW spoke with Crystal SW from [facility], she reported pt lives with son Montine Circle. Crystal reported they have not started the process for placement. Crystal stated she will call pt's daughter and get more information on pt's situation.   ? ?1:00pm  ?CSW received a call from [place], Per pt's daughter pt's son Montine Circle left pt alone for two days. She stated [facility] has  approved pt to d/c to an SNF for observations. Crystal stated she has to contact [facility] to confirm bed availability. Crystal reported [facility] will provide transportation to the facility once the bed is confirmed.  TOC to follow. ? ? ?ED Mini Assessment: ?What brought you to the Emergency Department? : Pt pressed life alert button ? ?Barriers to Discharge: ED Facility/Family Refusing to Allow Patient to Return ? ?  ? ?  ? ?  ? ? ? ?Patient Contact and Communications ?  ?  ?  ? ,     ?  ?  ? ?  ?  ?  ? ?Admission diagnosis:  abd pain, diarrhea ?Patient Active Problem List  ? Diagnosis Date Noted  ? Aphasia 04/06/2018  ? Dysphagia 03/11/2016  ? Irritable bowel syndrome with diarrhea 03/11/2016  ? History of colonic polyps 05/24/2013  ? Malignant neoplasm of corpus uteri, except isthmus ([place]) 12/22/2010  ? Pulmonary embolus ([place])   ? DVT (deep venous thrombosis) ([place])   ? Chronic diarrhea   ? Insomnia   ? Multiple thyroid nodules   ? Anorexia   ? ?PCP:  Janifer Adie, MD ?Pharmacy:  No Pharmacies Listed  ?

## 2021-04-21 NOTE — ED Notes (Signed)
Pt provided snack of saline crackers and ginger ale. Pt repositioned for comfort, brief remains dry at this time. ?

## 2021-04-21 NOTE — ED Notes (Signed)
Pt's brief soiled with urine and small amount of stool. Brief changed and pt repositioned. Pt states no further needs. ?

## 2021-04-22 NOTE — ED Notes (Signed)
Pt's brief soiled with urine. Pt changed and repositioned. Pt states that she urinated again before exiting the room, so pt changed into clean brief again. ?

## 2021-04-22 NOTE — Discharge Instructions (Signed)
It was a pleasure caring for you today in the emergency department. ° °Please return to the emergency department for any worsening or worrisome symptoms. ° ° °

## 2021-04-22 NOTE — ED Notes (Signed)
Pt resting comfortably with eyes closed, brief remains dry ?

## 2021-04-22 NOTE — ED Notes (Signed)
Report given to Ishmael Holter, Therapist, sports at [facility].  ?

## 2021-04-22 NOTE — Progress Notes (Signed)
Pt to d/c to [facility], pt's daughter will provide transportation,  call report to [phone]. ? ?Arlie Solomons.Fusako Tanabe, MSW, [place] ?[place]  Transitions of Care ?Clinical Social Worker I ?Direct Dial: [phone]  Fax: [phone] ?[email or web address]  ?

## 2021-04-22 NOTE — ED Notes (Signed)
SpO2 monitor placed on pt's foot under sock. ?

## 2021-11-11 ENCOUNTER — Ambulatory Visit
Admission: RE | Admit: 2021-11-11 | Discharge: 2021-11-11 | Disposition: A | Discharge status: Home / Self Care | Payer: Medicare (Managed Care) | Source: Ambulatory Visit | Attending: Vascular Surgery | Admitting: Vascular Surgery

## 2021-11-11 ENCOUNTER — Other Ambulatory Visit: Payer: Self-pay | Admitting: Vascular Surgery

## 2021-11-11 DIAGNOSIS — M79621 Pain in right upper arm: Secondary | ICD-10-CM

## 2021-12-03 ENCOUNTER — Emergency Department (HOSPITAL_COMMUNITY)
Admission: EM | Admit: 2021-12-03 | Discharge: 2021-12-03 | Disposition: A | Discharge status: Skilled Nursing Facility | Payer: Medicare (Managed Care) | Attending: Emergency Medicine | Admitting: Emergency Medicine

## 2021-12-03 ENCOUNTER — Emergency Department (HOSPITAL_COMMUNITY): Payer: Medicare (Managed Care)

## 2021-12-03 DIAGNOSIS — S0990XA Unspecified injury of head, initial encounter: Secondary | ICD-10-CM | POA: Diagnosis present

## 2021-12-03 DIAGNOSIS — S0181XA Laceration without foreign body of other part of head, initial encounter: Secondary | ICD-10-CM | POA: Insufficient documentation

## 2021-12-03 DIAGNOSIS — W19XXXA Unspecified fall, initial encounter: Secondary | ICD-10-CM | POA: Diagnosis not present

## 2021-12-03 DIAGNOSIS — F0390 Unspecified dementia without behavioral disturbance: Secondary | ICD-10-CM | POA: Insufficient documentation

## 2021-12-03 LAB — CBC WITH DIFFERENTIAL/PLATELET
Abs Immature Granulocytes: 0.02 10*3/uL (ref 0.00–0.07)
Basophils Absolute: 0 10*3/uL (ref 0.0–0.1)
Basophils Relative: 0 %
Eosinophils Absolute: 0 10*3/uL (ref 0.0–0.5)
Eosinophils Relative: 0 %
HCT: 35.3 % — ABNORMAL LOW (ref 36.0–46.0)
Hemoglobin: 11.3 g/dL — ABNORMAL LOW (ref 12.0–15.0)
Immature Granulocytes: 0 %
Lymphocytes Relative: 26 %
Lymphs Abs: 1.5 10*3/uL (ref 0.7–4.0)
MCH: 30.5 pg (ref 26.0–34.0)
MCHC: 32 g/dL (ref 30.0–36.0)
MCV: 95.4 fL (ref 80.0–100.0)
Monocytes Absolute: 0.5 10*3/uL (ref 0.1–1.0)
Monocytes Relative: 9 %
Neutro Abs: 3.6 10*3/uL (ref 1.7–7.7)
Neutrophils Relative %: 65 %
Platelets: 158 10*3/uL (ref 150–400)
RBC: 3.7 MIL/uL — ABNORMAL LOW (ref 3.87–5.11)
RDW: 12.3 % (ref 11.5–15.5)
WBC: 5.6 10*3/uL (ref 4.0–10.5)
nRBC: 0 % (ref 0.0–0.2)

## 2021-12-03 LAB — BASIC METABOLIC PANEL
Anion gap: 7 (ref 5–15)
BUN: 26 mg/dL — ABNORMAL HIGH (ref 8–23)
CO2: 27 mmol/L (ref 22–32)
Calcium: 9.5 mg/dL (ref 8.9–10.3)
Chloride: 105 mmol/L (ref 98–111)
Creatinine, Ser: 1.32 mg/dL — ABNORMAL HIGH (ref 0.44–1.00)
GFR, Estimated: 40 mL/min — ABNORMAL LOW (ref >60)
Glucose, Bld: 90 mg/dL (ref 70–99)
Potassium: 3.9 mmol/L (ref 3.5–5.1)
Sodium: 139 mmol/L (ref 135–145)

## 2021-12-03 MED ORDER — LIDOCAINE HCL (PF) 1 % IJ SOLN
5.0000 mL | Freq: Once | INTRAMUSCULAR | Status: AC
  Administered 2021-12-03: 5 mL via INTRADERMAL
  Filled 2021-12-03: qty 5

## 2021-12-03 NOTE — ED Notes (Signed)
Suture cart & lido at bedside.

## 2021-12-03 NOTE — ED Notes (Signed)
[facility] called they will be here in the next hour

## 2021-12-03 NOTE — ED Notes (Signed)
Pt's lac above Lt eye has been sutured.

## 2021-12-03 NOTE — Discharge Instructions (Addendum)
[name] was seen today for a ground-level fall.  Fortunately she had no traumatic injuries other than a laceration on her forehead.  4 absorbable stitches were placed for laceration approximation.  These will dissolve on their own and that the wound should be evaluated daily to ensure no evidence of developing infection.  Do not scrub the wound, do not use any alcohol products.  The wound may be rinsed as needed.

## 2021-12-03 NOTE — ED Provider Notes (Signed)
[facility] EMERGENCY DEPARTMENT Provider Note   CSN: [id number] Arrival date & time: 12/03/21  6761     History Chief Complaint  Patient presents with   Level 2- fall on thinners    HPI [name] is a 87 y.o. female presenting for chief complaint of level trauma.  She is a 87 year old female who had a fall at her facility today.  She was going to the bathroom when she fell forward.  Has a stellate laceration to her forehead.  She has underlying dementia lives at a skilled nursing facility and does not remember the incident.  She is GCS 14.  Intermittently confused but able to read the clock on the wall.  She says that she has a slight headache at this time. Per chart, Tdap was updated 9 years ago.   Patient's recorded medical, surgical, social, medication list and allergies were reviewed in the Snapshot window as part of the initial history.   Review of Systems   Review of Systems  Unable to perform ROS: Dementia    Physical Exam Updated Vital Signs BP (!) 156/65   Pulse 69   Temp 97.9 F (36.6 C) (Oral)   Resp 15   Ht '5\' 7"'$  (1.702 m)   Wt 54.4 kg   SpO2 96%   BMI 18.79 kg/m  Physical Exam Vitals and nursing note reviewed.  Constitutional:      General: She is not in acute distress.    Appearance: She is well-developed.  HENT:     Head: Normocephalic and atraumatic.  Eyes:     Conjunctiva/sclera: Conjunctivae normal.  Cardiovascular:     Rate and Rhythm: Normal rate and regular rhythm.     Heart sounds: No murmur heard. Pulmonary:     Effort: Pulmonary effort is normal. No respiratory distress.     Breath sounds: Normal breath sounds.  Abdominal:     General: There is no distension.     Palpations: Abdomen is soft.     Tenderness: There is no abdominal tenderness. There is no right CVA tenderness or left CVA tenderness.  Musculoskeletal:        General: Signs of injury (stellate 3cm laceration to face) present. No swelling or  tenderness. Normal range of motion.     Cervical back: Neck supple.  Skin:    General: Skin is warm and dry.  Neurological:     General: No focal deficit present.     Mental Status: She is alert and oriented to person, place, and time. Mental status is at baseline.     Cranial Nerves: No cranial nerve deficit.      ED Course/ Medical Decision Making/ A&P Clinical Course as of 12/03/21 1033  Wed Dec 03, 2021  0902 Stitches [CC]    Clinical Course User Index [CC] Tretha Sciara, MD    Procedures .Marland KitchenLaceration Repair  Date/Time: 12/03/2021 10:18 AM  Performed by: Tretha Sciara, MD Authorized by: Tretha Sciara, MD   Consent:    Risks discussed:  Infection, need for additional repair and pain Anesthesia:    Anesthesia method:  Local infiltration   Local anesthetic:  Lidocaine 1% w/o epi Laceration details:    Location:  Face   Face location:  Forehead   Length (cm):  3 Pre-procedure details:    Preparation:  Patient was prepped and draped in usual sterile fashion Exploration:    Limited defect created (wound extended): no     Contaminated: no   Treatment:  Amount of cleaning:  Standard   Irrigation volume:  500cc Skin repair:    Repair method:  Sutures   Suture size:  5-0   Suture material:  Fast-absorbing gut Approximation:    Approximation:  Close Repair type:    Repair type:  Simple Post-procedure details:    Dressing:  Sterile dressing   Procedure completion:  Tolerated well, no immediate complications    Medications Ordered in ED Medications  lidocaine (PF) (XYLOCAINE) 1 % injection 5 mL (has no administration in time range)   Medical Decision Making:    [name] is a 87 y.o. female who presented to the ED today with a moderate mechanisma trauma, detailed above.    Handoff received from EMS.  Patient's presentation is complicated by their history of advanced age and blood thinner utilization.  Patient placed on continuous vitals  and telemetry monitoring while in ED which was reviewed periodically.   Given this mechanism of trauma, a full physical exam was performed. Notably, patient was HDS in NAD.  She has no joint tenderness on palpation of all extremities and joints.  She is able to lift all limbs against resistance. She has a stellate laceration on her forehead repaired as above.  Reviewed and confirmed nursing documentation for past medical history, family history, social history.    Initial Assessment/Plan:   This is a patient presenting with a moderate mechanism trauma.  As such, I have considered intracranial injuries including intracranial hemorrhage, intrathoracic injuries including blunt myocardial or blunt lung injury, blunt abdominal injuries including aortic dissection, bladder injury, spleen injury, liver injury and I have considered orthopedic injuries including extremity or spinal injury.  With the patient's presentation of moderate mechanism trauma but an otherwise reassuring exam, patient warrants targeted evaluation for potential traumatic injuries. Will proceed with targeted evaluation for potential injuries. Will proceed with CTH/Cspine. Objective evaluation resulted with no acute abnormality.   Final Reassessment and Plan:   Laceration was successfully repaired as above.  CTs with no focal pathology.  Labs grossly reassuring.  Attempted to call family to update them on her plan of care this morning.  However no answer.  Regardless, given reassuring objective findings today, patient's general well appearance, I believe she is stable for continued outpatient care management with reassessment by her PCP in the outpatient setting.  Disposition:  I have considered need for hospitalization, however, considering all of the above, I believe this patient is stable for discharge at this time.  Patient/family educated about specific return precautions for given chief complaint and symptoms.  Patient/family  educated about follow-up with PCP.     Patient/family expressed understanding of return precautions and need for follow-up. Patient spoken to regarding all imaging and laboratory results and appropriate follow up for these results. All education provided in verbal form with additional information in written form. Time was allowed for answering of patient questions. Patient discharged.    Emergency Department Medication Summary:   Medications  lidocaine (PF) (XYLOCAINE) 1 % injection 5 mL (has no administration in time range)          Clinical Impression:  1. Fall, initial encounter      Discharge   Final Clinical Impression(s) / ED Diagnoses Final diagnoses:  Fall, initial encounter    Rx / DC Orders ED Discharge Orders     None         Tretha Sciara, MD 12/03/21 [phone]

## 2021-12-03 NOTE — Progress Notes (Signed)
Orthopedic Tech Progress Note Patient Details:  [name] 21-Aug-1934 [id number]  [facility]   Patient ID: [name], female   DOB: Oct 11, 1934, 87 y.o.   MRN: [id number]  [name] 12/03/2021, 7:16 AM

## 2021-12-03 NOTE — ED Notes (Signed)
Patient transported to CT 

## 2021-12-03 NOTE — ED Triage Notes (Signed)
Pt BIB [facility] from [facility] as Level 2 fall on thinners. She reports walking to the bathroom & tripping. Arrived to ED with a small alc on her forehead, GCS 14-oriented at baseline, 20g Lt FA PIV. 176/74, 70 bpm, 96% on RA, no c-collar in place.

## 2022-03-11 ENCOUNTER — Encounter: Payer: Self-pay | Admitting: *Deleted

## 2022-03-12 ENCOUNTER — Encounter: Payer: Self-pay | Admitting: Neurology

## 2022-03-12 ENCOUNTER — Ambulatory Visit: Payer: Medicare (Managed Care) | Admitting: Neurology

## 2022-03-12 VITALS — BP 107/56 | HR 60 | Ht 67.0 in | Wt 100.4 lb

## 2022-03-12 DIAGNOSIS — R413 Other amnesia: Secondary | ICD-10-CM

## 2022-03-12 DIAGNOSIS — G20C Parkinsonism, unspecified: Secondary | ICD-10-CM | POA: Diagnosis not present

## 2022-03-12 NOTE — Progress Notes (Signed)
Subjective:    Patient ID: [name] is a 88 y.o. female.  HPI    Star Age, MD, PhD [facility] [address], [address] [place], [place] [address]  Dear [name],  I saw your patient, Skylier [name], upon your kind request in my neurologic clinic today for initial consultation of her parkinsonism.  The patient is accompanied by Hoyle Sauer, her caretaker today.  The patient resides at [facility] and rehab.  We also talked to her daughter on the phone to provide additional information; the daughter was able to provide limited additional history.  As you know, Ms. Chokshi is an 88 year old female with an underlying complex medical history of anxiety, clotting disorder, history of DVT, history of PE, neuropathy, thyroid nodules, reflux disease, chronic diarrhea, endometrial cancer, and Parkinson's disease as well as dementia, who is unable to provide her history.  Caretaker states that she is unable to walk on her own, she is essentially wheelchair-bound.  Daughter reports that she has had behavioral changes.  Daughter has reports that she has not seen a neurologist before.  The patient has fallen, unclear when she had her last fall.  She was diagnosed with Parkinson's disease some 5 to 7 years ago.  She is currently on carbidopa-levodopa 25-100 mg strength 1 pill 4 times a day.  Of note, she she is currently also on Zyprexa 10 mg at bedtime, apparently for sleep and agitation.  She is on Eliquis 2.5 mg twice daily, Lexapro 10 mg daily for anxiety and also takes mirtazapine 15 mg at bedtime for anxiety, sleep and appetite.  More detailed history is not available.  I reviewed your office visit note from 11/06/2021.   Her Past Medical History Is Significant For: Past Medical History:  Diagnosis Date   Anorexia    Anxiety    Blood transfusion without reported diagnosis    1970's   Chronic diarrhea    Clotting disorder (HCC)    Dementia (HCC)     Depression    DVT (deep venous thrombosis) (HCC)    Endometrial ca (HCC)    GERD (gastroesophageal reflux disease)    Hiatal hernia    History of D&C    History of sinus surgery    Hypokalemia    IBS (irritable bowel syndrome)    Insomnia    Insomnia    Multiple thyroid nodules 12/2005   b/l nodules thyroid    Neuropathy    Parkinson's disease    Pulmonary embolus ([place])    Radiation Jan.3-Feb.6,2008   External Beam to pelvis   Radiation Feb.13,20 and 27/2008   Intracavitary brachytherapy   Seizure disorder ([place])    Stroke ([place])    R brain Left hemiparesis mild    Her Past Surgical History Is Significant For: Past Surgical History:  Procedure Laterality Date   ABDOMINAL HYSTERECTOMY  08/11/05   tah,bso,pelvic lymphadenectomy   ankle fracture repair Left    antroscopy     maxillary   CHOLECYSTECTOMY     COLONOSCOPY  2012   benign colonic polyps   ESOPHAGOGASTRODUODENOSCOPY  2012   neg   IVC Filter  2007   s/p DVT and P.E>   LAPAROSCOPIC ASSISTED RADICAL VAGINAL HYSTERECTOMY W/ NODE BIOPSY  08/11/05   LYMPHADENECTOMY     rotator cuff repair Right     Her Family History Is Significant For: Family History  Problem Relation Age of Onset   Stomach cancer Sister    Brain cancer  Brother    Lung cancer Brother    Breast cancer Sister    Colon cancer Neg Hx     Her Social History Is Significant For: Social History   Socioeconomic History   Marital status: Married    Spouse name: Not on file   Number of children: 5   Years of education: Not on file   Highest education level: Not on file  Occupational History   Occupation: RETIRED, Pharmacist, hospital Asst/Librarian    Employer: RETIRED  Tobacco Use   Smoking status: Never   Smokeless tobacco: Never  Substance and Sexual Activity   Alcohol use: No   Drug use: No   Sexual activity: Not on file  Other Topics Concern   Not on file  Social History Narrative   Not on file   Social Determinants of Health   Financial  Resource Strain: Not on file  Food Insecurity: Not on file  Transportation Needs: Not on file  Physical Activity: Not on file  Stress: Not on file  Social Connections: Not on file    Her Allergies Are:  Allergies  Allergen Reactions   Aspridrox [Aspirin Buf(Alhyd-Mghyd-Cacar)] Other (See Comments)    Unknown  Not documented on MAR   Codeine Other (See Comments)    Unknown   Darvocet [Propoxyphene N-Acetaminophen] Other (See Comments)    Unknown  Not documented on MAR   Esomeprazole Magnesium Other (See Comments)    Unknown  Not documented on MAR   Iodine Other (See Comments)    unknown   Morphine And Related Other (See Comments)    unknown   Sulfa Antibiotics Other (See Comments)    Unknown  Not documented on MAR   Sulfate Other (See Comments)    Unknown  Not documented on MAR   Tramadol Other (See Comments)    Unknown  Not documented on MAR  :   Her Current Medications Are:  Outpatient Encounter Medications as of 03/12/2022  Medication Sig   acetaminophen (TYLENOL) 650 MG CR tablet Take 650 mg by mouth every 8 (eight) hours as needed for pain.   apixaban (ELIQUIS) 2.5 MG TABS tablet Take 2.5 mg by mouth 2 (two) times daily.   calcium carbonate (TUMS EX) 750 MG chewable tablet Chew 750 mg by mouth daily as needed for heartburn.   carbidopa-levodopa (SINEMET IR) 25-100 MG tablet Take 0.5-1 tablets by mouth See admin instructions. One tablet in the am @ 0900 and 0.5 tablet at 1700 and 1 tablet @ 2000   cholecalciferol (VITAMIN D) 1000 UNITS tablet Take 1,000 Units by mouth daily.   cholestyramine (QUESTRAN) 4 g packet Take 1 packet (4 g total) by mouth daily. Take 1.5 hours away from other medications.   Cyanocobalamin (VITAMIN B 12) 100 MCG LOZG Take 1,000 mcg by mouth daily.   ENSURE PLUS (ENSURE PLUS) LIQD Take 237 mLs by mouth daily as needed (nutrition supplement).   escitalopram (LEXAPRO) 5 MG tablet Take 1 tablet (5 mg total) by mouth daily. (Patient taking  differently: Take 10 mg by mouth daily.)   famotidine (PEPCID) 20 MG tablet Take 20 mg by mouth 2 (two) times daily.   loperamide (IMODIUM) 2 MG capsule Take 2 mg by mouth 2 (two) times daily as needed for diarrhea or loose stools.    Menthol, Topical Analgesic, 4 % GEL Apply 3.5 % topically 2 (two) times daily as needed (pain).   mirtazapine (REMERON) 7.5 MG tablet Take 1 tablet (7.5 mg total) by mouth at  bedtime as needed (sleep). (Patient taking differently: Take 7.5 mg by mouth at bedtime.)   OLANZapine (ZYPREXA) 5 MG tablet Take 5 mg by mouth at bedtime.   omeprazole (PRILOSEC) 20 MG capsule Take 20 mg by mouth daily. (Patient not taking: Reported on 12/03/2021)   No facility-administered encounter medications on file as of 03/12/2022.  :   Review of Systems:  Out of a complete 14 point review of systems, all are reviewed and negative with the exception of these symptoms as listed below:   Review of Systems  Neurological:        Pt here for Parkinson consult   Daughter states 2 falls in Dec. 2023  Pt has tremors in both hands Daughter states involuntary movement. . Pt has Dementia and Parkinson's. Daughter states that patient is with [facility] and they need a updated diagnosis for medication increased  Pt leans to right side     Objective:  Neurological Exam  Physical Exam Physical Examination:   Vitals:   03/12/22 1500  BP: (!) 107/56  Pulse: 60    General Examination: The patient is in a wheelchair, frail and deconditioned appearing, minimally verbal.  Leaning to the right in the wheelchair.    HEENT: Normocephalic, atraumatic, pupils are equal, [name] and reactive to light, tracking impaired, moderate nuchal rigidity, no obvious sialorrhea, face with moderate facial masking, no carotid bruits.    Chest: Clear to auscultation without wheezing, rhonchi or crackles noted.  Heart: S1+S2+0, regular and normal without murmurs, rubs or gallops noted.   Abdomen: Soft, non-tender and  non-distended.  Extremities: There is 2+ pitting edema in the distal left lower extremity, trace edema in the right distal lower extremity.     Skin: Warm and dry without trophic changes noted.   Musculoskeletal: exam reveals no obvious joint deformities but limited range of motion, very thin limbs.   Neurologically:  Mental status: The patient is awake, alert.  Minimally verbal, unable to provide history.  Speech is moderately hypophonic and mildly dysarthric.  Speech is scant.  Cranial nerves II - XII are as described above under HEENT exam.  Motor exam: Very thin bulk, global strength of 4 out of 5, resting tremor in both upper extremities in the mild range, moderate difficulty with fine motor control in the upper extremities, severe in the lower extremities, no lateralization.  Tone is increased with cogwheeling in all 4 extremities.  She is wheelchair-bound, she I did not ask her to stand or walk for me today, no walker available and caretaker states that she is not stable to walk with her walker.  Cerebellar testing: No dysmetria or intention tremor. Sensory exam: intact to light touch in the upper and lower extremities.   Assessment and Plan:  In summary, [name] is a very pleasant 88 y.o.-year old female with an underlying complex medical history of anxiety, clotting disorder, history of DVT, history of PE, neuropathy, thyroid nodules, reflux disease, chronic diarrhea, endometrial cancer, and Parkinson's disease as well as dementia, who presents for evaluation of her parkinsonism of at least 5 to 7 years duration with associated memory loss and behavioral changes per daughter's report.  She is currently on carbidopa-levodopa 25-100 mg strength 1 pill 4 times daily.  She can maintain on this regimen from my end of things.  I am not sure what type of agitation or behavioral changes she is had but she is on a fair dose of Zyprexa.  She is advised to follow-up with  her current providers.   She is advised to follow-up in this clinic as needed.  I explained this to her caretaker as well.  I answered their questions today and the caretaker was in agreement.  Patient's daughter provided limited additional information.  She repeatedly asked me to refer her back to [facility] and their records.  Thank you very much for allowing me to participate in the care of this nice patient. If I can be of any further assistance to you please do not hesitate to call me at [phone].  Sincerely,   Star Age, MD, PhD

## 2022-03-12 NOTE — Patient Instructions (Signed)
For your parkinsonism you can continue with Sinemet 1 pill 4 times daily.  He can follow-up with your current providers.

## 2022-10-10 ENCOUNTER — Encounter (HOSPITAL_COMMUNITY): Payer: Self-pay | Admitting: Emergency Medicine

## 2022-10-10 ENCOUNTER — Emergency Department (HOSPITAL_COMMUNITY)
Admission: EM | Admit: 2022-10-10 | Discharge: 2022-10-11 | Disposition: A | Discharge status: Skilled Nursing Facility | Payer: Medicare (Managed Care) | Attending: Emergency Medicine | Admitting: Emergency Medicine

## 2022-10-10 ENCOUNTER — Other Ambulatory Visit: Payer: Self-pay

## 2022-10-10 ENCOUNTER — Emergency Department (HOSPITAL_COMMUNITY): Payer: Medicare (Managed Care)

## 2022-10-10 DIAGNOSIS — S0012XA Contusion of left eyelid and periocular area, initial encounter: Secondary | ICD-10-CM | POA: Diagnosis present

## 2022-10-10 DIAGNOSIS — G20C Parkinsonism, unspecified: Secondary | ICD-10-CM | POA: Diagnosis not present

## 2022-10-10 DIAGNOSIS — Y92129 Unspecified place in nursing home as the place of occurrence of the external cause: Secondary | ICD-10-CM | POA: Insufficient documentation

## 2022-10-10 DIAGNOSIS — W19XXXA Unspecified fall, initial encounter: Secondary | ICD-10-CM

## 2022-10-10 DIAGNOSIS — Z7901 Long term (current) use of anticoagulants: Secondary | ICD-10-CM | POA: Diagnosis not present

## 2022-10-10 DIAGNOSIS — F0390 Unspecified dementia without behavioral disturbance: Secondary | ICD-10-CM | POA: Diagnosis not present

## 2022-10-10 DIAGNOSIS — W01198A Fall on same level from slipping, tripping and stumbling with subsequent striking against other object, initial encounter: Secondary | ICD-10-CM | POA: Diagnosis not present

## 2022-10-10 NOTE — ED Provider Notes (Incomplete)
[facility]PARTMENT AT [facility] Provider Note   CSN: [id number] Arrival date & time: 10/10/22  2253     History {Add pertinent medical, surgical, social history, OB history to HPI:1} Chief Complaint  Patient presents with  . Fall    [name] is a 88 y.o. female.  The history is provided by the patient. No language interpreter was used.  Fall       Home Medications Prior to Admission medications   Medication Sig Start Date End Date Taking? Authorizing Provider  acetaminophen (TYLENOL) 650 MG CR tablet Take 650 mg by mouth every 8 (eight) hours as needed for pain.    [provider]  apixaban (ELIQUIS) 2.5 MG TABS tablet Take 2.5 mg by mouth 2 (two) times daily.    [provider]  calcium carbonate (TUMS EX) 750 MG chewable tablet Chew 750 mg by mouth daily as needed for heartburn.    [provider]  carbidopa-levodopa (SINEMET IR) 25-100 MG tablet Take 0.5-1 tablets by mouth See admin instructions. One tablet in the am @ 0900 and 0.5 tablet at 1700 and 1 tablet @ 2000    [provider]  cholecalciferol (VITAMIN D) 1000 UNITS tablet Take 1,000 Units by mouth daily.    [provider]  cholestyramine (QUESTRAN) 4 g packet Take 1 packet (4 g total) by mouth daily. Take 1.5 hours away from other medications. 03/11/16   Zehr, Princella Pellegrini, PA-C  Cyanocobalamin (VITAMIN B 12) 100 MCG LOZG Take 1,000 mcg by mouth daily.    [provider]  ENSURE PLUS (ENSURE PLUS) LIQD Take 237 mLs by mouth daily as needed (nutrition supplement).    [provider]  escitalopram (LEXAPRO) 5 MG tablet Take 1 tablet (5 mg total) by mouth daily. Patient taking differently: Take 10 mg by mouth daily. 04/08/18   Ali Lowe, MD  famotidine (PEPCID) 20 MG tablet Take 20 mg by mouth 2 (two) times daily.    [provider]  loperamide (IMODIUM) 2 MG capsule Take 2 mg by mouth 2 (two) times daily as needed for  diarrhea or loose stools.     [provider]  Menthol, Topical Analgesic, 4 % GEL Apply 3.5 % topically 2 (two) times daily as needed (pain).    [provider]  mirtazapine (REMERON) 7.5 MG tablet Take 1 tablet (7.5 mg total) by mouth at bedtime as needed (sleep). Patient taking differently: Take 7.5 mg by mouth at bedtime. 04/07/18   Ali Lowe, MD  OLANZapine (ZYPREXA) 5 MG tablet Take 5 mg by mouth at bedtime.    [provider]  omeprazole (PRILOSEC) 20 MG capsule Take 20 mg by mouth daily. Patient not taking: Reported on 12/03/2021    [provider]      Allergies    Aspridrox [aspirin buf(alhyd-mghyd-cacar)], Codeine, Darvocet [propoxyphene n-acetaminophen], Esomeprazole magnesium, Iodine, Morphine and codeine, Sulfa antibiotics, Sulfate, and Tramadol    Review of Systems   Review of Systems   Physical Exam Updated Vital Signs BP (!) 143/65   Pulse 66   Temp 98.3 F (36.8 C)   Resp 18   Ht 5\' 7"  (1.702 m)   Wt 45.4 kg   SpO2 98%   BMI 15.66 kg/m  Physical Exam Vitals and nursing note reviewed.  Constitutional:      General: She is not in acute distress.    Appearance: She is well-developed. She is not diaphoretic.  HENT:  Head: Normocephalic and atraumatic.  Eyes:     General: No scleral icterus.    Extraocular Movements: EOM normal.     Conjunctiva/sclera: Conjunctivae normal.  Pulmonary:     Effort: Pulmonary effort is normal. No respiratory distress.  Musculoskeletal:        General: Normal range of motion.     Cervical back: Normal range of motion.  Skin:    General: Skin is warm and dry.     Coloration: Skin is not pale.     Findings: No erythema or rash.  Neurological:     Mental Status: She is alert and oriented to person, place, and time.  Psychiatric:        Mood and Affect: Mood and affect normal.        Behavior: Behavior normal.     ED Results / Procedures / Treatments   Labs (all labs ordered are  listed, but only abnormal results are displayed) Labs Reviewed  CBC WITH DIFFERENTIAL/PLATELET  BASIC METABOLIC PANEL  PROTIME-INR  URINALYSIS, ROUTINE W REFLEX MICROSCOPIC    EKG None  Radiology No results found.  Procedures Procedures  {Document cardiac monitor, telemetry assessment procedure when appropriate:1}  Medications Ordered in ED Medications - No data to display  ED Course/ Medical Decision Making/ A&P Clinical Course as of 10/10/22 2355  Sat Oct 10, 2022  2342 Spoke with patient's RN at [facility]. RN works weekends only. Wasn't present yesterday at the time of the fall; cannot speak to mechanism or decision not to transport. RN states that patient has been sleepy most of the day today. Patient does have dementia, but can typically carry on a conversation. RN went in to give medications tonight and patient was "talking out of her head" and "not making sense". No known recent fevers. RN confirms the patient is on anticoagulants. [KH]  2347 Spoke with daughter, [name]. Daughter states patient can usually make complete sentences. Daughter was only getting mumbling on Monday; patient appeared drowsy. Daughter thought the patient had some congestion. Companion visits patient and reported to daughter that patient was more sleepy on Thursday and stayed in bed all day. Daughter requested COVID test; presumptively negative. [KH]    Clinical Course User Index [KH] Antony Madura, PA-C   {   Click here for ABCD2, HEART and other calculatorsREFRESH Note before signing :1}                              Medical Decision Making Amount and/or Complexity of Data Reviewed Labs: ordered. Radiology: ordered.   ***  {Document critical care time when appropriate:1} {Document review of labs and clinical decision tools ie heart score, Chads2Vasc2 etc:1}  {Document your independent review of radiology images, and any outside records:1} {Document your discussion with family members,  caretakers, and with consultants:1} {Document social determinants of health affecting pt's care:1} {Document your decision making why or why not admission, treatments were needed:1} Final Clinical Impression(s) / ED Diagnoses Final diagnoses:  None    Rx / DC Orders ED Discharge Orders     None

## 2022-10-10 NOTE — ED Triage Notes (Signed)
Patient BIB EMS for evaluation after fall yesterday.  EMS reports patient resides at [facility].  Pt fell yesterday and did hit her head.  On blood thinners.  Facility stated "she is now more altered since fall."  Pt was not evaluated after fall.  Has no complaints at this time

## 2022-10-10 NOTE — ED Provider Notes (Signed)
[place] EMERGENCY DEPARTMENT AT [facility] Provider Note   CSN: [id number] Arrival date & time: 10/10/22  2253     History  Chief Complaint  Patient presents with   Marletta Lor    [name] is a 88 y.o. female.  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 Facility reported the patient had a fall yesterday and struck her head.  On clear if patient had LOC.  They reported that she is now more altered today.  Patient denies pain or other complaint.  She is unable to contribute to hx due to her underlying dementia.  The history is provided by the patient. No language interpreter was used.  Fall       Home Medications Prior to Admission medications   Medication Sig Start Date End Date Taking? Authorizing Provider  acetaminophen (TYLENOL) 650 MG CR tablet Take 650 mg by mouth every 8 (eight) hours as needed for pain.    [provider]  apixaban (ELIQUIS) 2.5 MG TABS tablet Take 2.5 mg by mouth 2 (two) times daily.    [provider]  calcium carbonate (TUMS EX) 750 MG chewable tablet Chew 750 mg by mouth daily as needed for heartburn.    [provider]  carbidopa-levodopa (SINEMET IR) 25-100 MG tablet Take 0.5-1 tablets by mouth See admin instructions. One tablet in the am @ 0900 and 0.5 tablet at 1700 and 1 tablet @ 2000    [provider]  cholecalciferol (VITAMIN D) 1000 UNITS tablet Take 1,000 Units by mouth daily.    [provider]  cholestyramine (QUESTRAN) 4 g packet Take 1 packet (4 g total) by mouth daily. Take 1.5 hours away from other medications. 03/11/16   Zehr, Princella Pellegrini, PA-C  Cyanocobalamin (VITAMIN B 12) 100 MCG LOZG Take 1,000 mcg by mouth daily.    [provider]  ENSURE PLUS (ENSURE PLUS) LIQD Take 237 mLs by mouth daily as needed (nutrition supplement).    [provider]   escitalopram (LEXAPRO) 5 MG tablet Take 1 tablet (5 mg total) by mouth daily. Patient taking differently: Take 10 mg by mouth daily. 04/08/18   Ali Lowe, MD  famotidine (PEPCID) 20 MG tablet Take 20 mg by mouth 2 (two) times daily.    [provider]  loperamide (IMODIUM) 2 MG capsule Take 2 mg by mouth 2 (two) times daily as needed for diarrhea or loose stools.     [provider]  Menthol, Topical Analgesic, 4 % GEL Apply 3.5 % topically 2 (two) times daily as needed (pain).    [provider]  mirtazapine (REMERON) 7.5 MG tablet Take 1 tablet (7.5 mg total) by mouth at bedtime as needed (sleep). Patient taking differently: Take 7.5 mg by mouth at bedtime. 04/07/18   Ali Lowe, MD  OLANZapine (ZYPREXA) 5 MG tablet Take 5 mg by mouth at bedtime.    [provider]  omeprazole (PRILOSEC) 20 MG capsule Take 20 mg by mouth daily. Patient not taking: Reported on 12/03/2021    [provider]      Allergies    Aspridrox [aspirin buf(alhyd-mghyd-cacar)], Codeine, Darvocet [propoxyphene n-acetaminophen], Esomeprazole magnesium, Iodine, Morphine and codeine, Sulfa antibiotics, Sulfate, and Tramadol    Review of Systems   Review of Systems  Unable to perform ROS: Dementia    Physical Exam Updated Vital Signs BP 119/61  Pulse 74   Temp 99 F (37.2 C) (Oral)   Resp 19   Ht 5\' 7"  (1.702 m)   Wt 45.4 kg   SpO2 94%   BMI 15.66 kg/m   Physical Exam Vitals and nursing note reviewed.  Constitutional:      General: She is not in acute distress.    Appearance: She is well-developed. She is not diaphoretic.     Comments: Thin, frail appearing  HENT:     Head: Normocephalic and atraumatic.     Right Ear: External ear normal.     Left Ear: External ear normal.  Eyes:     General: No scleral icterus.    Extraocular Movements: EOM normal.     Conjunctiva/sclera: Conjunctivae normal.     Comments: Contusion with superficial laceration to  the lateral L eyebrow  Cardiovascular:     Rate and Rhythm: Normal rate and regular rhythm.     Pulses: Normal pulses.  Pulmonary:     Effort: Pulmonary effort is normal. No respiratory distress.     Breath sounds: No stridor. No wheezing.     Comments: Lungs CTAB. Respirations even and unlabored. Musculoskeletal:        General: Normal range of motion.     Cervical back: Normal range of motion.  Skin:    General: Skin is warm and dry.     Coloration: Skin is not pale.     Findings: No erythema or rash.  Neurological:     Mental Status: She is alert and oriented to person, place, and time.     Coordination: Coordination normal.     Comments: Alert. Speech is garbled, unable to form comprehensive sentence. Moving all extremities spontaneously. No obvious focal deficits.  Psychiatric:        Mood and Affect: Mood and affect normal.        Behavior: Behavior normal.     ED Results / Procedures / Treatments   Labs (all labs ordered are listed, but only abnormal results are displayed) Labs Reviewed  CBC WITH DIFFERENTIAL/PLATELET - Abnormal; Notable for the following components:      Result Value   RBC 3.47 (*)    Hemoglobin 10.5 (*)    HCT 33.6 (*)    All other components within normal limits  BASIC METABOLIC PANEL - Abnormal; Notable for the following components:   Creatinine, Ser 1.54 (*)    GFR, Estimated 33 (*)    All other components within normal limits  PROTIME-INR - Abnormal; Notable for the following components:   Prothrombin Time 17.0 (*)    INR 1.4 (*)    All other components within normal limits  URINALYSIS, ROUTINE W REFLEX MICROSCOPIC - Abnormal; Notable for the following components:   Ketones, ur 5 (*)    All other components within normal limits    EKG None  Radiology CT HEAD WO CONTRAST ( )  Result Date: 10/11/2022 CLINICAL DATA:  Head trauma, minor (Age >= 65y) EXAM: CT HEAD WITHOUT CONTRAST TECHNIQUE: Contiguous axial images were obtained from  the base of the skull through the vertex without intravenous contrast. RADIATION DOSE REDUCTION: This exam was performed according to the departmental dose-optimization program which includes automated exposure control, adjustment of the mA and/or kV according to patient size and/or use of iterative reconstruction technique. COMPARISON:  CT head 12/03/2021 FINDINGS: Brain: Patchy and confluent areas of decreased attenuation are noted throughout the deep and periventricular white matter of the cerebral hemispheres bilaterally, compatible with chronic  microvascular ischemic disease. No evidence of large-territorial acute infarction. No parenchymal hemorrhage. No mass lesion. No extra-axial collection. No mass effect or midline shift. No hydrocephalus. Basilar cisterns are patent. Vascular: No hyperdense vessel. Skull: No acute fracture or focal lesion. Sinuses/Orbits: Paranasal sinuses and mastoid air cells are clear. Bilateral lens replacement. Otherwise the orbits are unremarkable. Other: None. IMPRESSION: No acute intracranial abnormality. Electronically Signed   By: Tish Frederickson M.D.   On: 10/11/2022 01:19   DG Chest Port 1 View  Result Date: 10/11/2022 CLINICAL DATA:  Fall, altered mental status EXAM: PORTABLE CHEST 1 VIEW COMPARISON:  Radiographs 02/23/2020 FINDINGS: Stable cardiomediastinal silhouette. Aortic atherosclerotic calcification. Hyperinflation and chronic bronchitic changes. Right basilar atelectasis/scarring. Chronic blunting of the right costophrenic angle. No pneumothorax. No definite displaced rib fractures. IMPRESSION: No acute cardiopulmonary process.  Emphysema. Electronically Signed   By: Minerva Fester M.D.   On: 10/11/2022 00:43    Procedures Procedures    Medications Ordered in ED Medications - No data to display  ED Course/ Medical Decision Making/ A&P Clinical Course as of 10/11/22 0259  Sat Oct 10, 2022  2342 Spoke with patient's RN at [facility]. RN works weekends  only. Wasn't present yesterday at the time of the fall; cannot speak to mechanism or decision not to transport. RN states that patient has been sleepy most of the day today. Patient does have dementia, but can typically carry on a conversation. RN went in to give medications tonight and patient was "talking out of her head" and "not making sense". No known recent fevers. RN confirms the patient is on anticoagulants. [KH]  2347 Spoke with daughter, [name]. Daughter states patient can usually make complete sentences. Daughter was only getting mumbling on Monday; patient appeared drowsy. Daughter thought the patient had some congestion. Companion visits patient and reported to daughter that patient was more sleepy on Thursday and stayed in bed all day. Daughter requested COVID test; presumptively negative. [KH]  Sun Oct 11, 2022  1191 Negative CXR today. No fever or leukocytosis to suggest infectious process. I have viewed and interpreted the patient's head CT. Do not see obvious hemorrhage. There is generalized atrophy which is likely chronic. Pending formal radiology interpretation. [KH]  0131 Per radiology interpretation, no acute process on head CT. [KH]  0258 Spoke with patient's daughter, [name] regarding reassuring work up. Daughter is comfortable with patient's transfer back to her facility. All questions answered. [KH]    Clinical Course User Index [KH] Antony Madura, PA-C                                 Medical Decision Making Amount and/or Complexity of Data Reviewed Labs: ordered. Radiology: ordered.   This patient presents to the ED for concern of altered mental status w/recent fall, this involves an extensive number of treatment options, and is a complaint that carries with it a high risk of complications and morbidity.  The differential diagnosis includes ICH vs concussion vs UTI vs hypoxemia vs electrolyte derangement.   Co morbidities that complicate the patient  evaluation  Parkinson's Dementia VTE (on Eliquis)   Additional history obtained:  Additional history obtained from EMS personnel, SNF staff, daughter External records from outside source obtained and reviewed including historical hemoglobin values, for baseline.   Lab Tests:  I Ordered, and personally interpreted labs.  The pertinent results include:  Hgb 10.5 (baseline, stable), Creatinine 1.54 (baseline, stable), negative UA.  Imaging Studies ordered:  I ordered imaging studies including CXR and head CT  I independently visualized and interpreted imaging which showed no acute pathology I agree with the radiologist interpretation   Cardiac Monitoring:  The patient was maintained on a cardiac monitor.  I personally viewed and interpreted the cardiac monitored which showed an underlying rhythm of: NSR   Medicines ordered and prescription drug management:  I have reviewed the patients home medicines and have made adjustments as needed   Test Considered:  Ammonia    Problem List / ED Course:  As above Patient with reassuring evaluation in the ED.  No electrolyte derangements, urinary tract infection She did have a fall recently, but is without focal neurologic deficit on exam.  CT head without acute or traumatic pathology. No fever, leukocytosis, or other SIRS criteria to suggest underlying infection or sepsis concerns.  Her chest x-ray shows no pneumonia, pneumothorax, pleural effusion. Spoke with daughter who is comfortable with patient's transfer back to her facility.   Reevaluation:  After the interventions noted above, I reevaluated the patient and found that they have :stayed the same   Social Determinants of Health:  SNF resident Dementia   Dispostion:  After consideration of the diagnostic results and the patients response to treatment, I feel that the patent would benefit from outpatient PCP f/u. Stable for transport back to SNF via [facility].           Final Clinical Impression(s) / ED Diagnoses Final diagnoses:  Fall at nursing home, initial encounter    Rx / DC Orders ED Discharge Orders     None         Antony Madura, Cordelia Poche 10/11/22 0303    Dione Booze, MD 10/11/22 [phone]

## 2022-10-11 ENCOUNTER — Emergency Department (HOSPITAL_COMMUNITY): Payer: Medicare (Managed Care)

## 2022-10-11 LAB — CBC WITH DIFFERENTIAL/PLATELET
Abs Immature Granulocytes: 0.03 10*3/uL (ref 0.00–0.07)
Basophils Absolute: 0 10*3/uL (ref 0.0–0.1)
Basophils Relative: 0 %
Eosinophils Absolute: 0 10*3/uL (ref 0.0–0.5)
Eosinophils Relative: 1 %
HCT: 33.6 % — ABNORMAL LOW (ref 36.0–46.0)
Hemoglobin: 10.5 g/dL — ABNORMAL LOW (ref 12.0–15.0)
Immature Granulocytes: 1 %
Lymphocytes Relative: 18 %
Lymphs Abs: 1.2 10*3/uL (ref 0.7–4.0)
MCH: 30.3 pg (ref 26.0–34.0)
MCHC: 31.3 g/dL (ref 30.0–36.0)
MCV: 96.8 fL (ref 80.0–100.0)
Monocytes Absolute: 0.6 10*3/uL (ref 0.1–1.0)
Monocytes Relative: 9 %
Neutro Abs: 4.7 10*3/uL (ref 1.7–7.7)
Neutrophils Relative %: 71 %
Platelets: 184 10*3/uL (ref 150–400)
RBC: 3.47 MIL/uL — ABNORMAL LOW (ref 3.87–5.11)
RDW: 12.2 % (ref 11.5–15.5)
WBC: 6.6 10*3/uL (ref 4.0–10.5)
nRBC: 0 % (ref 0.0–0.2)

## 2022-10-11 LAB — PROTIME-INR
INR: 1.4 — ABNORMAL HIGH (ref 0.8–1.2)
Prothrombin Time: 17 s — ABNORMAL HIGH (ref 11.4–15.2)

## 2022-10-11 LAB — URINALYSIS, ROUTINE W REFLEX MICROSCOPIC
Bilirubin Urine: NEGATIVE
Glucose, UA: NEGATIVE mg/dL
Hgb urine dipstick: NEGATIVE
Ketones, ur: 5 mg/dL — AB
Leukocytes,Ua: NEGATIVE
Nitrite: NEGATIVE
Protein, ur: NEGATIVE mg/dL
Specific Gravity, Urine: 1.014 (ref 1.005–1.030)
pH: 5 (ref 5.0–8.0)

## 2022-10-11 LAB — BASIC METABOLIC PANEL
Anion gap: 13 (ref 5–15)
BUN: 22 mg/dL (ref 8–23)
CO2: 26 mmol/L (ref 22–32)
Calcium: 10 mg/dL (ref 8.9–10.3)
Chloride: 105 mmol/L (ref 98–111)
Creatinine, Ser: 1.54 mg/dL — ABNORMAL HIGH (ref 0.44–1.00)
GFR, Estimated: 33 mL/min — ABNORMAL LOW (ref >60)
Glucose, Bld: 82 mg/dL (ref 70–99)
Potassium: 3.8 mmol/L (ref 3.5–5.1)
Sodium: 144 mmol/L (ref 135–145)

## 2022-10-11 NOTE — Discharge Instructions (Signed)
Your value patient in the emergency department has been reassuring.  Your symptoms may be related to some of your daily medications or progression of your known dementia and Parkinson's.  Continue follow-up with your primary care doctor as well as neurology, if desired.  Return to the ED for new or concerning symptoms.

## 2022-10-11 NOTE — ED Notes (Signed)
Pt transferred to ED 51 while awaiting [facility] for discharge.

## 2022-10-11 NOTE — ED Notes (Signed)
Pt transported via [facility] back to [facility]. VSS. Report called by nightshift RN.

## 2022-10-11 NOTE — ED Notes (Signed)
Pt again trying to get out of bed.  This RN entered room and pt immediately told this RN to get out.  This RN informed pt that he had to make sure she was safe and he could not leave until she was back in the bed.  Tech stepped into room to assist this RN, asking pt where she was trying to go.  Pt states, "I was trying to go get some corn and potato salad to put in this here."  Pt reminded that she is in the hospital.  Pt skeptical of this RN, telling tech that she does not know this RN and that this RN needs to leave her alone.  Tech told pt that this RN was just trying to ensure her safety.  Pt states again that this RN needs to leave her alone.

## 2022-10-11 NOTE — ED Notes (Signed)
Pt attempting to climb out of bed.  This RN and tech attempting to redirect pt.  Pt continued to standing and was very unsteady on feet.  Pt yelling for staff to leave her alone.  Pt informed that she needs to remain in bed due to risk for falls and re-oriented to current time of morning.  Pt continues to tell this RN to leave her alone, stating she is going to have him "locked up."  Pt able to be redirected back into bed and is lying down at this time.

## 2022-11-18 ENCOUNTER — Other Ambulatory Visit: Payer: Self-pay

## 2022-11-18 ENCOUNTER — Inpatient Hospital Stay (HOSPITAL_COMMUNITY)
Admission: EM | Admit: 2022-11-18 | Discharge: 2022-11-24 | DRG: 377 | Disposition: A | Discharge status: Skilled Nursing Facility | Payer: Medicare (Managed Care) | Source: Skilled Nursing Facility | Attending: Internal Medicine | Admitting: Internal Medicine

## 2022-11-18 ENCOUNTER — Emergency Department (HOSPITAL_COMMUNITY): Payer: Medicare (Managed Care)

## 2022-11-18 ENCOUNTER — Encounter (HOSPITAL_COMMUNITY): Payer: Self-pay

## 2022-11-18 DIAGNOSIS — A419 Sepsis, unspecified organism: Secondary | ICD-10-CM

## 2022-11-18 DIAGNOSIS — G40909 Epilepsy, unspecified, not intractable, without status epilepticus: Secondary | ICD-10-CM | POA: Diagnosis present

## 2022-11-18 DIAGNOSIS — K5780 Diverticulitis of intestine, part unspecified, with perforation and abscess without bleeding: Principal | ICD-10-CM | POA: Diagnosis present

## 2022-11-18 DIAGNOSIS — Z90710 Acquired absence of both cervix and uterus: Secondary | ICD-10-CM

## 2022-11-18 DIAGNOSIS — Z95828 Presence of other vascular implants and grafts: Secondary | ICD-10-CM

## 2022-11-18 DIAGNOSIS — Z7901 Long term (current) use of anticoagulants: Secondary | ICD-10-CM | POA: Diagnosis not present

## 2022-11-18 DIAGNOSIS — D62 Acute posthemorrhagic anemia: Secondary | ICD-10-CM | POA: Diagnosis not present

## 2022-11-18 DIAGNOSIS — N179 Acute kidney failure, unspecified: Secondary | ICD-10-CM | POA: Diagnosis present

## 2022-11-18 DIAGNOSIS — Z993 Dependence on wheelchair: Secondary | ICD-10-CM

## 2022-11-18 DIAGNOSIS — Z923 Personal history of irradiation: Secondary | ICD-10-CM

## 2022-11-18 DIAGNOSIS — D649 Anemia, unspecified: Secondary | ICD-10-CM | POA: Diagnosis present

## 2022-11-18 DIAGNOSIS — Z8542 Personal history of malignant neoplasm of other parts of uterus: Secondary | ICD-10-CM

## 2022-11-18 DIAGNOSIS — K922 Gastrointestinal hemorrhage, unspecified: Secondary | ICD-10-CM

## 2022-11-18 DIAGNOSIS — Z882 Allergy status to sulfonamides status: Secondary | ICD-10-CM

## 2022-11-18 DIAGNOSIS — Z86718 Personal history of other venous thrombosis and embolism: Secondary | ICD-10-CM

## 2022-11-18 DIAGNOSIS — E876 Hypokalemia: Secondary | ICD-10-CM | POA: Diagnosis present

## 2022-11-18 DIAGNOSIS — F02818 Dementia in other diseases classified elsewhere, unspecified severity, with other behavioral disturbance: Secondary | ICD-10-CM | POA: Diagnosis present

## 2022-11-18 DIAGNOSIS — Z1152 Encounter for screening for COVID-19: Secondary | ICD-10-CM

## 2022-11-18 DIAGNOSIS — Z515 Encounter for palliative care: Secondary | ICD-10-CM

## 2022-11-18 DIAGNOSIS — Z9049 Acquired absence of other specified parts of digestive tract: Secondary | ICD-10-CM

## 2022-11-18 DIAGNOSIS — K589 Irritable bowel syndrome without diarrhea: Secondary | ICD-10-CM | POA: Diagnosis present

## 2022-11-18 DIAGNOSIS — K625 Hemorrhage of anus and rectum: Secondary | ICD-10-CM

## 2022-11-18 DIAGNOSIS — D689 Coagulation defect, unspecified: Secondary | ICD-10-CM | POA: Diagnosis present

## 2022-11-18 DIAGNOSIS — Z66 Do not resuscitate: Secondary | ICD-10-CM | POA: Diagnosis present

## 2022-11-18 DIAGNOSIS — E43 Unspecified severe protein-calorie malnutrition: Secondary | ICD-10-CM | POA: Diagnosis present

## 2022-11-18 DIAGNOSIS — R509 Fever, unspecified: Secondary | ICD-10-CM | POA: Diagnosis present

## 2022-11-18 DIAGNOSIS — F02811 Dementia in other diseases classified elsewhere, unspecified severity, with agitation: Secondary | ICD-10-CM | POA: Diagnosis present

## 2022-11-18 DIAGNOSIS — Z8673 Personal history of transient ischemic attack (TIA), and cerebral infarction without residual deficits: Secondary | ICD-10-CM

## 2022-11-18 DIAGNOSIS — Z886 Allergy status to analgesic agent status: Secondary | ICD-10-CM

## 2022-11-18 DIAGNOSIS — N1831 Chronic kidney disease, stage 3a: Secondary | ICD-10-CM | POA: Diagnosis present

## 2022-11-18 DIAGNOSIS — K627 Radiation proctitis: Secondary | ICD-10-CM

## 2022-11-18 DIAGNOSIS — F0390 Unspecified dementia without behavioral disturbance: Secondary | ICD-10-CM | POA: Diagnosis not present

## 2022-11-18 DIAGNOSIS — Z681 Body mass index (BMI) 19 or less, adult: Secondary | ICD-10-CM | POA: Diagnosis not present

## 2022-11-18 DIAGNOSIS — R17 Unspecified jaundice: Secondary | ICD-10-CM | POA: Diagnosis present

## 2022-11-18 DIAGNOSIS — Z79899 Other long term (current) drug therapy: Secondary | ICD-10-CM

## 2022-11-18 DIAGNOSIS — Z86711 Personal history of pulmonary embolism: Secondary | ICD-10-CM

## 2022-11-18 DIAGNOSIS — G20A1 Parkinson's disease without dyskinesia, without mention of fluctuations: Secondary | ICD-10-CM | POA: Diagnosis present

## 2022-11-18 DIAGNOSIS — Z860100 Personal history of colon polyps, unspecified: Secondary | ICD-10-CM

## 2022-11-18 DIAGNOSIS — G4700 Insomnia, unspecified: Secondary | ICD-10-CM | POA: Diagnosis present

## 2022-11-18 DIAGNOSIS — Z7189 Other specified counseling: Secondary | ICD-10-CM | POA: Diagnosis not present

## 2022-11-18 DIAGNOSIS — R64 Cachexia: Secondary | ICD-10-CM | POA: Diagnosis present

## 2022-11-18 DIAGNOSIS — Z885 Allergy status to narcotic agent status: Secondary | ICD-10-CM

## 2022-11-18 DIAGNOSIS — Z888 Allergy status to other drugs, medicaments and biological substances status: Secondary | ICD-10-CM

## 2022-11-18 DIAGNOSIS — I952 Hypotension due to drugs: Secondary | ICD-10-CM | POA: Diagnosis present

## 2022-11-18 DIAGNOSIS — K5721 Diverticulitis of large intestine with perforation and abscess with bleeding: Principal | ICD-10-CM | POA: Diagnosis present

## 2022-11-18 DIAGNOSIS — K631 Perforation of intestine (nontraumatic): Secondary | ICD-10-CM | POA: Diagnosis present

## 2022-11-18 DIAGNOSIS — Z781 Physical restraint status: Secondary | ICD-10-CM

## 2022-11-18 HISTORY — DX: Chronic kidney disease, unspecified: N18.9

## 2022-11-18 LAB — POC OCCULT BLOOD, ED: Fecal Occult Bld: POSITIVE — AB

## 2022-11-18 LAB — CBC WITH DIFFERENTIAL/PLATELET
Abs Immature Granulocytes: 0.15 10*3/uL — ABNORMAL HIGH (ref 0.00–0.07)
Basophils Absolute: 0 10*3/uL (ref 0.0–0.1)
Basophils Relative: 0 %
Eosinophils Absolute: 0 10*3/uL (ref 0.0–0.5)
Eosinophils Relative: 0 %
HCT: 29.7 % — ABNORMAL LOW (ref 36.0–46.0)
Hemoglobin: 9.3 g/dL — ABNORMAL LOW (ref 12.0–15.0)
Immature Granulocytes: 1 %
Lymphocytes Relative: 5 %
Lymphs Abs: 0.9 10*3/uL (ref 0.7–4.0)
MCH: 30.9 pg (ref 26.0–34.0)
MCHC: 31.3 g/dL (ref 30.0–36.0)
MCV: 98.7 fL (ref 80.0–100.0)
Monocytes Absolute: 1 10*3/uL (ref 0.1–1.0)
Monocytes Relative: 5 %
Neutro Abs: 15.8 10*3/uL — ABNORMAL HIGH (ref 1.7–7.7)
Neutrophils Relative %: 89 %
Platelets: 196 10*3/uL (ref 150–400)
RBC: 3.01 MIL/uL — ABNORMAL LOW (ref 3.87–5.11)
RDW: 13.8 % (ref 11.5–15.5)
WBC: 17.8 10*3/uL — ABNORMAL HIGH (ref 4.0–10.5)
nRBC: 0 % (ref 0.0–0.2)

## 2022-11-18 LAB — URINALYSIS, W/ REFLEX TO CULTURE (INFECTION SUSPECTED)
Bilirubin Urine: NEGATIVE
Glucose, UA: NEGATIVE mg/dL
Ketones, ur: 5 mg/dL — AB
Leukocytes,Ua: NEGATIVE
Nitrite: NEGATIVE
Protein, ur: NEGATIVE mg/dL
Specific Gravity, Urine: 1.015 (ref 1.005–1.030)
pH: 6 (ref 5.0–8.0)

## 2022-11-18 LAB — COMPREHENSIVE METABOLIC PANEL
ALT: 17 U/L (ref 0–44)
AST: 25 U/L (ref 15–41)
Albumin: 3 g/dL — ABNORMAL LOW (ref 3.5–5.0)
Alkaline Phosphatase: 84 U/L (ref 38–126)
Anion gap: 8 (ref 5–15)
BUN: 34 mg/dL — ABNORMAL HIGH (ref 8–23)
CO2: 25 mmol/L (ref 22–32)
Calcium: 9.5 mg/dL (ref 8.9–10.3)
Chloride: 103 mmol/L (ref 98–111)
Creatinine, Ser: 1.49 mg/dL — ABNORMAL HIGH (ref 0.44–1.00)
GFR, Estimated: 34 mL/min — ABNORMAL LOW (ref >60)
Glucose, Bld: 91 mg/dL (ref 70–99)
Potassium: 4.6 mmol/L (ref 3.5–5.1)
Sodium: 136 mmol/L (ref 135–145)
Total Bilirubin: 2.2 mg/dL — ABNORMAL HIGH (ref <1.2)
Total Protein: 6.6 g/dL (ref 6.5–8.1)

## 2022-11-18 LAB — I-STAT CG4 LACTIC ACID, ED
Lactic Acid, Venous: 1.2 mmol/L (ref 0.5–1.9)
Lactic Acid, Venous: 1 mmol/L (ref 0.5–1.9)
Lactic Acid, Venous: 2.4 mmol/L (ref 0.5–1.9)
Lactic Acid, Venous: 0.7 mmol/L (ref 0.5–1.9)

## 2022-11-18 LAB — RESP PANEL BY RT-PCR (RSV, FLU A&B, COVID)  RVPGX2
Influenza A by PCR: NEGATIVE
Influenza B by PCR: NEGATIVE
Resp Syncytial Virus by PCR: NEGATIVE
SARS Coronavirus 2 by RT PCR: NEGATIVE

## 2022-11-18 LAB — I-STAT CHEM 8, ED
BUN: 42 mg/dL — ABNORMAL HIGH (ref 8–23)
Calcium, Ion: 1.2 mmol/L (ref 1.15–1.40)
Chloride: 104 mmol/L (ref 98–111)
Creatinine, Ser: 1.7 mg/dL — ABNORMAL HIGH (ref 0.44–1.00)
Glucose, Bld: 93 mg/dL (ref 70–99)
HCT: 31 % — ABNORMAL LOW (ref 36.0–46.0)
Hemoglobin: 10.5 g/dL — ABNORMAL LOW (ref 12.0–15.0)
Potassium: 4.6 mmol/L (ref 3.5–5.1)
Sodium: 139 mmol/L (ref 135–145)
TCO2: 28 mmol/L (ref 22–32)

## 2022-11-18 LAB — PROTIME-INR
INR: 1.5 — ABNORMAL HIGH (ref 0.8–1.2)
Prothrombin Time: 18.7 s — ABNORMAL HIGH (ref 11.4–15.2)

## 2022-11-18 LAB — APTT: aPTT: 26 s (ref 24–36)

## 2022-11-18 LAB — TYPE AND SCREEN
ABO/RH(D): B POS
Antibody Screen: NEGATIVE

## 2022-11-18 MED ORDER — LORAZEPAM 2 MG/ML IJ SOLN
0.5000 mg | Freq: Once | INTRAMUSCULAR | Status: AC
Start: 2022-11-18 — End: 2022-11-18
  Administered 2022-11-18: 0.5 mg via INTRAVENOUS
  Filled 2022-11-18: qty 1

## 2022-11-18 MED ORDER — PIPERACILLIN-TAZOBACTAM 3.375 G IVPB: 3.3750 g | Freq: Three times a day (TID) | INTRAVENOUS | Status: DC

## 2022-11-18 MED ORDER — SODIUM CHLORIDE 0.9% FLUSH
10.0000 mL | Freq: Two times a day (BID) | INTRAVENOUS | Status: DC
  Administered 2022-11-18 – 2022-11-19 (×3): 10 mL via INTRAVENOUS

## 2022-11-18 MED ORDER — PANTOPRAZOLE SODIUM 40 MG IV SOLR
40.0000 mg | Freq: Two times a day (BID) | INTRAVENOUS | Status: DC
  Administered 2022-11-18 – 2022-11-22 (×8): 40 mg via INTRAVENOUS
  Filled 2022-11-18 (×8): qty 10

## 2022-11-18 MED ORDER — PANTOPRAZOLE SODIUM 40 MG PO TBEC: 40.0000 mg | DELAYED_RELEASE_TABLET | Freq: Two times a day (BID) | ORAL | Status: DC

## 2022-11-18 MED ORDER — DIAZEPAM 5 MG/ML IJ SOLN
2.5000 mg | Freq: Once | INTRAMUSCULAR | Status: DC
  Filled 2022-11-18: qty 2

## 2022-11-18 MED ORDER — HALOPERIDOL LACTATE 5 MG/ML IJ SOLN
2.0000 mg | Freq: Four times a day (QID) | INTRAMUSCULAR | Status: DC | PRN
  Administered 2022-11-19: 2 mg via INTRAVENOUS
  Filled 2022-11-18: qty 1

## 2022-11-18 MED ORDER — SODIUM CHLORIDE 0.9 % IV BOLUS
1000.0000 mL | Freq: Once | INTRAVENOUS | Status: AC
  Administered 2022-11-18: 1000 mL via INTRAVENOUS

## 2022-11-18 MED ORDER — PIPERACILLIN-TAZOBACTAM IN DEX 2-0.25 GM/50ML IV SOLN
2.2500 g | Freq: Three times a day (TID) | INTRAVENOUS | Status: DC
  Administered 2022-11-19 (×2): 2.25 g via INTRAVENOUS
  Filled 2022-11-18 (×2): qty 50

## 2022-11-18 MED ORDER — LACTATED RINGERS IV BOLUS (SEPSIS)
1000.0000 mL | Freq: Once | INTRAVENOUS | Status: AC
  Administered 2022-11-18: 1000 mL via INTRAVENOUS

## 2022-11-18 MED ORDER — POLYETHYLENE GLYCOL 3350 17 G PO PACK: 17.0000 g | PACK | Freq: Two times a day (BID) | ORAL | Status: DC

## 2022-11-18 MED ORDER — HALOPERIDOL LACTATE 5 MG/ML IJ SOLN
2.0000 mg | Freq: Once | INTRAMUSCULAR | Status: AC
  Administered 2022-11-18: 2 mg via INTRAMUSCULAR
  Filled 2022-11-18: qty 1

## 2022-11-18 MED ORDER — LACTATED RINGERS IV SOLN: INTRAVENOUS | Status: DC

## 2022-11-18 MED ORDER — BISACODYL 5 MG PO TBEC
5.0000 mg | DELAYED_RELEASE_TABLET | Freq: Two times a day (BID) | ORAL | Status: DC
Start: 2022-11-18 — End: 2022-11-18

## 2022-11-18 MED ORDER — METRONIDAZOLE 500 MG/100ML IV SOLN
500.0000 mg | Freq: Once | INTRAVENOUS | Status: AC
  Administered 2022-11-18: 500 mg via INTRAVENOUS
  Filled 2022-11-18: qty 100

## 2022-11-18 MED ORDER — SODIUM CHLORIDE 0.9 % IV SOLN
2.0000 g | Freq: Once | INTRAVENOUS | Status: AC
  Administered 2022-11-18: 2 g via INTRAVENOUS
  Filled 2022-11-18: qty 20

## 2022-11-18 MED ORDER — PIPERACILLIN-TAZOBACTAM 3.375 G IVPB 30 MIN
3.3750 g | Freq: Once | INTRAVENOUS | Status: AC
  Administered 2022-11-18: 3.375 g via INTRAVENOUS
  Filled 2022-11-18: qty 50

## 2022-11-18 NOTE — ED Provider Notes (Signed)
[facility] AT [facility] Provider Note   CSN: [id number] Arrival date & time: 11/18/22  1047     History  Chief Complaint  Patient presents with   Altered Mental Status    [name] is a 88 y.o. female.  HPI      88 year old female with a history of anxiety, clotting disorder, history of DVT/PE, neuropathy, thyroid nodules, chronic diarrhea, endometrial cancer, Parkinson's disease, dementia, who is on Eliquis 2.5 mg (held it yesterday) who presents from [place] [facility] with concern for blood in the stool starting yesterday, increased agitation from her baseline, found to have fever on arrival to the ED.   History is limited by patient's dementia.  Facility past time via EMS and to family that patient had had blood in her stool starting yesterday. They held her eliquis yesterday. Today, her daughter felt she was more agitated than her baseline.  Night nurse said had bloody BM at 10PM, has been 2 days, Monday had a bloody stool too. Not able to eat, spitting out her medications, water this AM--unlke her.  Temp was 100.1 for EMS, 99.5 for facility No falls, no cough, no congestion. Unknown how many bloody stool.      Home Medications Prior to Admission medications   Medication Sig Start Date End Date Taking? Authorizing Provider  acetaminophen (TYLENOL) 650 MG CR tablet Take 1,300 mg by mouth every 8 (eight) hours as needed for pain.   Yes [provider]  apixaban (ELIQUIS) 2.5 MG TABS tablet Take 2.5 mg by mouth 2 (two) times daily.   Yes [provider]  calcium carbonate (TUMS EX) 750 MG chewable tablet Chew 750 mg by mouth 2 (two) times daily.   Yes [provider]  carbidopa-levodopa (SINEMET IR) 25-100 MG tablet Take 1 tablet by mouth 4 (four) times daily. 1000, 1400, 1800, 2200   Yes [provider]  cholecalciferol (VITAMIN D) 1000 UNITS tablet Take 1,000 Units by mouth daily.   Yes [provider]  cholestyramine (QUESTRAN) 4 g packet Take 1 packet (4 g total) by mouth daily. Take 1.5 hours away from other medications. 03/11/16  Yes Zehr, Princella Pellegrini, PA-C  Cyanocobalamin (VITAMIN B 12) 100 MCG LOZG Take 1,000 mcg by mouth daily.   Yes [provider]  escitalopram (LEXAPRO) 5 MG tablet Take 1 tablet (5 mg total) by mouth daily. Patient taking differently: Take 10 mg by mouth daily. 04/08/18  Yes Ali Lowe, MD  famotidine (PEPCID) 20 MG tablet Take 20 mg by mouth daily.   Yes [provider]  loperamide (IMODIUM A-D) 2 MG tablet Take 4 mg by mouth in the morning, at noon, and at bedtime.   Yes [provider]  magnesium gluconate (MAGONATE) 500 MG tablet Take 500 mg by mouth 2 (two) times daily.   Yes [provider]  mirtazapine (REMERON) 15 MG tablet Take 15 mg by mouth at bedtime.   Yes [provider]  NON FORMULARY Take 120 mLs by mouth 2 (two) times daily. Med Pass   Yes [provider]  NONFORMULARY OR COMPOUNDED ITEM Apply 1 Application topically daily as needed (Severe anxiety.). Ativan Gel: Apply (1mg /ml) 1mL once every 24 hours as needed.   Yes [provider]  OLANZapine (ZYPREXA) 10 MG tablet Take 10 mg by mouth at bedtime.   Yes [provider]  Skin Protectants, Misc. (MINERIN CREME EX) Apply 1 Application topically daily.   Yes [provider]      Allergies    Aspridrox [aspirin buf(alhyd-mghyd-cacar)], Codeine, Darvocet [propoxyphene n-acetaminophen], Esomeprazole magnesium, Iodine, Morphine and codeine, Sulfa antibiotics, Sulfate, and Tramadol    Review of Systems   Review of Systems  Physical Exam Updated Vital Signs BP (!) 119/105 (BP Location: Left Arm) Comment: pt unable to stay still  Pulse 85   Temp 97.6 F (36.4 C) (Axillary)   Resp 16   Ht 5\' 7"  (1.702 m)   Wt 45.4 kg   SpO2 94%   BMI 15.68 kg/m  Physical Exam Vitals and nursing note reviewed.   Constitutional:      General: She is not in acute distress.    Appearance: She is well-developed. She is not diaphoretic.     Comments: Alert, picking at pulse ox  HENT:     Head: Normocephalic and atraumatic.  Eyes:     Conjunctiva/sclera: Conjunctivae normal.  Cardiovascular:     Rate and Rhythm: Normal rate and regular rhythm.     Heart sounds: Normal heart sounds. No murmur heard.    No friction rub. No gallop.  Pulmonary:     Effort: Pulmonary effort is normal. No respiratory distress.     Breath sounds: Normal breath sounds. No wheezing or rales.  Abdominal:     General: There is no distension.     Palpations: Abdomen is soft.     Tenderness: There is abdominal tenderness. There is no guarding.  Genitourinary:    Comments: Brick red colored stool on exam Musculoskeletal:        General: No tenderness.     Cervical back: Normal range of motion.  Skin:    General: Skin is warm and dry.     Findings: No erythema or rash.  Neurological:     Mental Status: She is alert.     Comments: Oriented to self Responds to questions but rambling and difficult to understand No focal weakness, symmetric face Tongue midline, palate symmetric Eom appear intact, pupils normal Does follow commands but requires some redirecting (I.e. when asked to left left leg started to get out of bed and had to be redirected to hold up leg)      ED Results / Procedures / Treatments   Labs (all labs ordered are listed, but only abnormal results are displayed) Labs Reviewed  COMPREHENSIVE METABOLIC PANEL - Abnormal; Notable for the following components:      Result Value   BUN 34 (*)    Creatinine, Ser 1.49 (*)    Albumin 3.0 (*)    Total Bilirubin 2.2 (*)    GFR, Estimated 34 (*)    All other components within normal limits  CBC WITH DIFFERENTIAL/PLATELET - Abnormal; Notable for the following components:   WBC 17.8 (*)    RBC 3.01 (*)    Hemoglobin 9.3 (*)    HCT 29.7 (*)    Neutro Abs 15.8  (*)    Abs Immature Granulocytes 0.15 (*)    All other components within normal limits  PROTIME-INR - Abnormal; Notable for the following components:   Prothrombin Time 18.7 (*)    INR 1.5 (*)    All other components within normal limits  URINALYSIS, W/ REFLEX TO CULTURE (INFECTION SUSPECTED) - Abnormal; Notable for the following components:   APPearance HAZY (*)    Hgb urine dipstick SMALL (*)    Ketones, ur 5 (*)    Bacteria, UA RARE (*)    All other components within normal  limits  I-STAT CHEM 8, ED - Abnormal; Notable for the following components:   BUN 42 (*)    Creatinine, Ser 1.70 (*)    Hemoglobin 10.5 (*)    HCT 31.0 (*)    All other components within normal limits  POC OCCULT BLOOD, ED - Abnormal; Notable for the following components:   Fecal Occult Bld POSITIVE (*)    All other components within normal limits  I-STAT CG4 LACTIC ACID, ED - Abnormal; Notable for the following components:   Lactic Acid, Venous 2.4 (*)    All other components within normal limits  RESP PANEL BY RT-PCR (RSV, FLU A&B, COVID)  RVPGX2  CULTURE, BLOOD (ROUTINE X 2)  CULTURE, BLOOD (ROUTINE X 2)  APTT  CBC  BASIC METABOLIC PANEL  MAGNESIUM  PHOSPHORUS  I-STAT CG4 LACTIC ACID, ED  I-STAT CG4 LACTIC ACID, ED  I-STAT CG4 LACTIC ACID, ED  I-STAT CG4 LACTIC ACID, ED  TYPE AND SCREEN    EKG EKG Interpretation Date/Time:  Wednesday November 18 2022 11:02:00 EST Ventricular Rate:  88 PR Interval:  187 QRS Duration:  93 QT Interval:  347 QTC Calculation: 420 R Axis:   28  Text Interpretation: Artifact however appears to be sinus rhythm No acute ST changes Similar probable left ventricular hypertrophy No significant change since last tracing Confirmed by Alvira Monday ([id number]) on 11/18/2022 11:45:11 AM  Radiology DG Chest Port 1 View  Result Date: 11/18/2022 CLINICAL DATA:  Altered mental status. EXAM: PORTABLE CHEST 1 VIEW COMPARISON:  Chest radiograph dated October 10, 2022.  FINDINGS: Patient is rotated to the right. The heart size and mediastinal contours are within normal limits. No focal consolidation, pleural effusion, or pneumothorax. No acute osseous abnormality. IMPRESSION: No acute cardiopulmonary findings. Electronically Signed   By: Hart Robinsons M.D.   On: 11/18/2022 15:58   CT ABDOMEN PELVIS WO CONTRAST  Result Date: 11/18/2022 CLINICAL DATA:  Left lower quadrant abdominal pain. EXAM: CT ABDOMEN AND PELVIS WITHOUT CONTRAST TECHNIQUE: Multidetector CT imaging of the abdomen and pelvis was performed following the standard protocol without IV contrast. RADIATION DOSE REDUCTION: This exam was performed according to the departmental dose-optimization program which includes automated exposure control, adjustment of the mA and/or kV according to patient size and/or use of iterative reconstruction technique. COMPARISON:  April 20, 2021. FINDINGS: Lower chest: Minimal bibasilar subsegmental atelectasis is noted. Hepatobiliary: Status post cholecystectomy. No biliary dilatation. Multiple calcified granulomas are noted in the patent parenchyma which are unchanged. Pancreas: Unremarkable. No pancreatic ductal dilatation or surrounding inflammatory changes. Spleen: Normal in size without focal abnormality. Adrenals/Urinary Tract: Evaluation is limited due to extensive amount of air seen in the retroperitoneum superiorly which extends into the mediastinum. No hydronephrosis or renal obstruction is noted. Urinary bladder is unremarkable. Stomach/Bowel: Stomach is unremarkable. Mildly dilated small bowel loops are noted in the pelvis of uncertain etiology. Stool is noted in the sigmoid colon. There is moderate wall thickening of the sigmoid colon and a large amount of free air is noted adjacent to it concerning for perforation. This may account for the free air seen in the more superior portion of the retroperitoneum. Vascular/Lymphatic: Aortic atherosclerosis. No enlarged abdominal  or pelvic lymph nodes. IVC filter is noted in infrarenal position. Reproductive: Status post hysterectomy. No adnexal masses. Other: No hernia is noted. Musculoskeletal: No acute or significant osseous findings. IMPRESSION: Moderate wall thickening of the sigmoid colon is noted with a large amount of free air adjacent to it concerning for perforation,  potentially due to diverticulitis or colitis. Extensive amount of emphysema is noted in the retroperitoneal region most likely secondary to this, which extends into the visualized portion of the mediastinum. Mildly dilated small bowel loops are noted most likely representing ileus secondary to inflammation. Critical Value/emergent results were called by telephone at the time of interpretation on 11/18/2022 at 3:49 pm to provider [facility] , who verbally acknowledged these results. Electronically Signed   By: Lupita Raider M.D.   On: 11/18/2022 15:49   CT Head Wo Contrast  Result Date: 11/18/2022 CLINICAL DATA:  Head trauma.  Altered mental status. EXAM: CT HEAD WITHOUT CONTRAST TECHNIQUE: Contiguous axial images were obtained from the base of the skull through the vertex without intravenous contrast. RADIATION DOSE REDUCTION: This exam was performed according to the departmental dose-optimization program which includes automated exposure control, adjustment of the mA and/or kV according to patient size and/or use of iterative reconstruction technique. COMPARISON:  10/11/2022 FINDINGS: Brain: Chronic brain atrophy. No sign of acute infarction, mass lesion, hemorrhage, hydrocephalus or extra-axial collection. Vascular: There is atherosclerotic calcification of the major vessels at the base of the brain. Skull: Negative Sinuses/Orbits: Clear/normal Other: None IMPRESSION: No acute or traumatic finding. Chronic brain atrophy. Electronically Signed   By: Paulina Fusi M.D.   On: 11/18/2022 12:32    Procedures .Critical Care  Performed by: Alvira Monday,  MD Authorized by: Alvira Monday, MD   Critical care provider statement:    Critical care time (minutes):  30   Critical care was time spent personally by me on the following activities:  Development of treatment plan with patient or surrogate, discussions with consultants, evaluation of patient's response to treatment, examination of patient, ordering and review of laboratory studies, ordering and review of radiographic studies, ordering and performing treatments and interventions, pulse oximetry, re-evaluation of patient's condition and review of old charts     Medications Ordered in ED Medications  sodium chloride flush (NS) 0.9 % injection 10 mL (10 mLs Intravenous Given 11/18/22 2153)  haloperidol lactate (HALDOL) injection 2 mg (has no administration in time range)  lactated ringers infusion ( Intravenous New Bag/Given 11/18/22 1743)  pantoprazole (PROTONIX) injection 40 mg (40 mg Intravenous Given 11/18/22 2153)  piperacillin-tazobactam (ZOSYN) IVPB 2.25 g (has no administration in time range)  lactated ringers bolus 1,000 mL (0 mLs Intravenous Stopped 11/18/22 1400)  cefTRIAXone (ROCEPHIN) 2 g in sodium chloride 0.9 % 100 mL IVPB (0 g Intravenous Stopped 11/18/22 1400)  metroNIDAZOLE (FLAGYL) IVPB 500 mg (0 mg Intravenous Stopped 11/18/22 1400)  LORazepam (ATIVAN) injection 0.5 mg (0.5 mg Intravenous Given 11/18/22 1545)  sodium chloride 0.9 % bolus 1,000 mL (0 mLs Intravenous Stopped 11/18/22 2024)  piperacillin-tazobactam (ZOSYN) IVPB 3.375 g (0 g Intravenous Stopped 11/18/22 2024)  haloperidol lactate (HALDOL) injection 2 mg (2 mg Intramuscular Given 11/18/22 1808)    ED Course/ Medical Decision Making/ A&P                                   88 year old female with a history of anxiety, clotting disorder, history of DVT/PE, neuropathy, thyroid nodules, chronic diarrhea, endometrial cancer, Parkinson's disease, dementia, who is on Eliquis 2.5 mg (held it yesterday) who  presents from [place] [facility] with concern for blood in the stool starting yesterday, increased agitation from her baseline, found to have fever on arrival to the ED.  DDx includes sepsis, colitis, diverticulitis, GI bleed.  Arrives febrile. Given clinical concern for possible intraabdominal source of infection was given empiric rocephin and azithromycin.   Labs show leukocytosis 17800, hgb 9.3 from previous hgb 10 one month ago, cr 1.49, lactic acid WNL. UA completed without concern for infection.    Regarding GIB: Rectal exam with red colored stool hemoccult positive. Last eliquis Monday per daughter.  Hgb slightly decreased Type and screen sent. GI consulted and evaluating.   CT abdomen pelvis completed and shows sigmoid perforation. Has already received rocephin/flagyl. General surgery consulted and to come to bedside. Dr. Silverio Lay assuming care at this time.          Final Clinical Impression(s) / ED Diagnoses Final diagnoses:  Perforated diverticulum  Sepsis, due to unspecified organism, unspecified whether acute organ dysfunction present [facility])  Gastrointestinal hemorrhage, unspecified gastrointestinal hemorrhage type    Rx / DC Orders ED Discharge Orders     None         Alvira Monday, MD 11/18/22 2235

## 2022-11-18 NOTE — ED Notes (Signed)
Mittens placed back on patient. Patient non combative and is not aggressive but pt is attempting to remove leads, IV access and other monitoring cords.

## 2022-11-18 NOTE — Consult Note (Addendum)
Consultation  Referring Provider: Dr. Dalene Seltzer    Primary Care Physician:  [facility], [facility] Primary Gastroenterologist: Dr. Lavon Paganini    Reason for Consultation: Blood in stool            HPI:   [name] is a 88 y.o. female with a past medical history as listed below including anxiety, clotting disorder, history of DVT/PE on Eliquis (last dose 11/16/2022), who presented to the ER with concern for blood in her stool starting on 11/17/2022.    At admission nursing facility also described increased agitation from her baseline and found to have a fever on arrival to the ED.  Nursing facility noted that patient had blood in her stool starting yesterday 11/12.  Apparently they held her Eliquis yesterday.  Daughter noted she was more agitated.  She had a bloody bowel movement at 10 PM yesterday was her last.  Also had some blood in her stool on Monday, 11/16/2022.  Apparently unable to eat, spitting out her medications and water which is unlike her.    Today, patient is unable to answer any questioning.  She seems disoriented and in fact does not even want me to examine her.  Her daughter is by her bedside and explains that she was told by the nursing facility that on Monday, 11/16/2022 patient started with some bright red bloody bowel movements around 10 PM, apparently had a few bowel movements overnight, they held her Eliquis on 11/12 and she continued to have at least 2 more per the daughter's recollection.  She is not aware that she has had any more overnight.Patient has been complaining of some abdominal discomfort since getting to the ER but patient's daughter is not sure if that is just because she is hungry or what is exactly going on.  Apparently patient did not like the bowel prep previously when she had to have a colonoscopy and daughter would like to avoid this if possible.    Denies fever, chills or weight loss.  ER course: BUN elevated, 22 on 10/10/2022,  34 on 11/18/2022 up to 42 now, creatinine 1.7, hemoglobin 11.3 a year ago, 10.5 on 10/10/2022, down to 9.3 earlier with repeat showing 10.5.  White count elevated 17.8  GI history: 12/04/2014 colonoscopy with Dr. Lavon Paganini with poor anal sphincter tone and mild radiation proctitis, sessile polyp ranging between 5-9 mm in size in the ascending colon 05/01/2010 EGD was normal  Past Medical History:  Diagnosis Date   Anorexia    Anxiety    Blood transfusion without reported diagnosis    1970's   Chronic diarrhea    Clotting disorder (HCC)    Dementia (HCC)    Depression    DVT (deep venous thrombosis) (HCC)    Endometrial ca (HCC)    GERD (gastroesophageal reflux disease)    Hiatal hernia    History of D&C    History of sinus surgery    Hypokalemia    IBS (irritable bowel syndrome)    Insomnia    Insomnia    Multiple thyroid nodules 12/2005   b/l nodules thyroid    Neuropathy    Parkinson's disease (HCC)    Pulmonary embolus (HCC)    Radiation Jan.3-Feb.6,2008   External Beam to pelvis   Radiation Feb.13,20 and 27/2008   Intracavitary brachytherapy   Seizure disorder (HCC)    Stroke (HCC)    R brain Left hemiparesis mild    Past Surgical History:  Procedure  Laterality Date   ABDOMINAL HYSTERECTOMY  08/11/05   tah,bso,pelvic lymphadenectomy   ankle fracture repair Left    antroscopy     maxillary   CHOLECYSTECTOMY     COLONOSCOPY  2012   benign colonic polyps   ESOPHAGOGASTRODUODENOSCOPY  2012   neg   IVC Filter  2007   s/p DVT and P.E>   LAPAROSCOPIC ASSISTED RADICAL VAGINAL HYSTERECTOMY W/ NODE BIOPSY  08/11/05   LYMPHADENECTOMY     rotator cuff repair Right     Family History  Problem Relation Age of Onset   Stomach cancer Sister    Brain cancer Brother    Lung cancer Brother    Breast cancer Sister    Colon cancer Neg Hx     Social History   Tobacco Use   Smoking status: Never   Smokeless tobacco: Never  Substance Use Topics   Alcohol use: No   Drug  use: No    Prior to Admission medications   Medication Sig Start Date End Date Taking? Authorizing Provider  acetaminophen (TYLENOL) 650 MG CR tablet Take 1,300 mg by mouth every 8 (eight) hours as needed for pain.   Yes [provider]  apixaban (ELIQUIS) 2.5 MG TABS tablet Take 2.5 mg by mouth 2 (two) times daily.   Yes [provider]  calcium carbonate (TUMS EX) 750 MG chewable tablet Chew 750 mg by mouth 2 (two) times daily.   Yes [provider]  carbidopa-levodopa (SINEMET IR) 25-100 MG tablet Take 1 tablet by mouth 4 (four) times daily. 1000, 1400, 1800, 2200   Yes [provider]  cholecalciferol (VITAMIN D) 1000 UNITS tablet Take 1,000 Units by mouth daily.   Yes [provider]  cholestyramine (QUESTRAN) 4 g packet Take 1 packet (4 g total) by mouth daily. Take 1.5 hours away from other medications. 03/11/16  Yes Zehr, Princella Pellegrini, PA-C  Cyanocobalamin (VITAMIN B 12) 100 MCG LOZG Take 1,000 mcg by mouth daily.   Yes [provider]  escitalopram (LEXAPRO) 5 MG tablet Take 1 tablet (5 mg total) by mouth daily. Patient taking differently: Take 10 mg by mouth daily. 04/08/18  Yes Ali Lowe, MD  famotidine (PEPCID) 20 MG tablet Take 20 mg by mouth daily.   Yes [provider]  loperamide (IMODIUM A-D) 2 MG tablet Take 4 mg by mouth in the morning, at noon, and at bedtime.   Yes [provider]  magnesium gluconate (MAGONATE) 500 MG tablet Take 500 mg by mouth 2 (two) times daily.   Yes [provider]  mirtazapine (REMERON) 15 MG tablet Take 15 mg by mouth at bedtime.   Yes [provider]  NON FORMULARY Take 120 mLs by mouth 2 (two) times daily. Med Pass   Yes [provider]  NONFORMULARY OR COMPOUNDED ITEM Apply 1 Application topically daily as needed (Severe anxiety.). Ativan Gel: Apply (1mg /ml) 1mL once every 24 hours as needed.   Yes [provider]  OLANZapine (ZYPREXA) 10 MG  tablet Take 10 mg by mouth at bedtime.   Yes [provider]  Skin Protectants, Misc. (MINERIN CREME EX) Apply 1 Application topically daily.   Yes [provider]    Current Facility-Administered Medications  Medication Dose Route Frequency Provider Last Rate Last Admin   sodium chloride flush (NS) 0.9 % injection 10 mL  10 mL Intravenous Q12H Alvira Monday, MD   10 mL at 11/18/22 1239   Current Outpatient Medications  Medication  Sig Dispense Refill   acetaminophen (TYLENOL) 650 MG CR tablet Take 1,300 mg by mouth every 8 (eight) hours as needed for pain.     apixaban (ELIQUIS) 2.5 MG TABS tablet Take 2.5 mg by mouth 2 (two) times daily.     calcium carbonate (TUMS EX) 750 MG chewable tablet Chew 750 mg by mouth 2 (two) times daily.     carbidopa-levodopa (SINEMET IR) 25-100 MG tablet Take 1 tablet by mouth 4 (four) times daily. 1000, 1400, 1800, 2200     cholecalciferol (VITAMIN D) 1000 UNITS tablet Take 1,000 Units by mouth daily.     cholestyramine (QUESTRAN) 4 g packet Take 1 packet (4 g total) by mouth daily. Take 1.5 hours away from other medications. 60 each 12   Cyanocobalamin (VITAMIN B 12) 100 MCG LOZG Take 1,000 mcg by mouth daily.     escitalopram (LEXAPRO) 5 MG tablet Take 1 tablet (5 mg total) by mouth daily. (Patient taking differently: Take 10 mg by mouth daily.) 30 tablet 0   famotidine (PEPCID) 20 MG tablet Take 20 mg by mouth daily.     loperamide (IMODIUM A-D) 2 MG tablet Take 4 mg by mouth in the morning, at noon, and at bedtime.     magnesium gluconate (MAGONATE) 500 MG tablet Take 500 mg by mouth 2 (two) times daily.     mirtazapine (REMERON) 15 MG tablet Take 15 mg by mouth at bedtime.     NON FORMULARY Take 120 mLs by mouth 2 (two) times daily. Med Pass     NONFORMULARY OR COMPOUNDED ITEM Apply 1 Application topically daily as needed (Severe anxiety.). Ativan Gel: Apply (1mg /ml) 1mL once every 24 hours as needed.     OLANZapine (ZYPREXA) 10 MG  tablet Take 10 mg by mouth at bedtime.     Skin Protectants, Misc. (MINERIN CREME EX) Apply 1 Application topically daily.      Allergies as of 11/18/2022 - Review Complete 11/18/2022  Allergen Reaction Noted   Aspridrox [aspirin buf(alhyd-mghyd-cacar)] Other (See Comments) 02/17/2010   Codeine Other (See Comments) 02/17/2010   Darvocet [propoxyphene n-acetaminophen] Other (See Comments) 02/17/2010   Esomeprazole magnesium Other (See Comments) 02/17/2010   Iodine Other (See Comments) 12/12/2010   Morphine and codeine Other (See Comments) 02/17/2010   Sulfa antibiotics Other (See Comments) 02/17/2010   Sulfate Other (See Comments) 02/17/2010   Tramadol Other (See Comments) 12/12/2010     Review of Systems:   (per daughter) Constitutional: No weight loss, fever or chills Skin: No rash Cardiovascular: No chest pain Respiratory: No SOB  Gastrointestinal: See HPI and otherwise negative Genitourinary: No dysuria  Neurological: No headache, dizziness or syncope Musculoskeletal: No new muscle or joint pain Hematologic: No bleeding  Psychiatric: No history of depression or anxiety    Physical Exam:  Vital signs in last 24 hours: Temp:  [99 F (37.2 C)] 99 F (37.2 C) (11/13 1106) Pulse Rate:  [84] 84 (11/13 1106) Resp:  [24] 24 (11/13 1106) BP: (102)/(81) 102/81 (11/13 1106) SpO2:  [92 %-100 %] 100 % (11/13 1216) Weight:  [45.4 kg] 45.4 kg (11/13 1217)   General:   Pleasant Elderly Caucasian female appears to be in NAD, Well developed, Well nourished, drowsy Head:  Normocephalic and atraumatic. Eyes:   PEERL, EOMI. No icterus. Conjunctiva pink. Ears:  Normal auditory acuity. Neck:  Supple Throat: Oral cavity and pharynx without inflammation, swelling or lesion. Teeth in good condition. Lungs: Respirations even and unlabored. Lungs clear to auscultation bilaterally.  No wheezes, crackles, or rhonchi.  Heart: Normal S1, S2. No MRG. Regular rate and rhythm. No peripheral edema,  cyanosis or pallor.  Abdomen:  Soft, nondistended, moderate generalized ttp with involuntary guarding. Normal bowel sounds. No appreciable masses or hepatomegaly. Rectal: Per ED brick red-colored stool on exam- patient would not let me complete rectal exam-even with help of daughter Msk:  Symmetrical without gross deformities. Peripheral pulses intact.  Extremities:  Without edema, no deformity or joint abnormality. Normal ROM, normal sensation. Neurologic:  Alert and  oriented x4;  grossly normal neurologically. Skin:   Dry and intact without significant lesions or rashes. Psychiatric: uncooperative, on the verge of combative   LAB RESULTS: Recent Labs    11/18/22 1140 11/18/22 1205  WBC 17.8*  --   HGB 9.3* 10.5*  HCT 29.7* 31.0*  PLT 196  --    BMET Recent Labs    11/18/22 1140 11/18/22 1205  NA 136 139  K 4.6 4.6  CL 103 104  CO2 25  --   GLUCOSE 91 93  BUN 34* 42*  CREATININE 1.49* 1.70*  CALCIUM 9.5  --    LFT Recent Labs    11/18/22 1140  PROT 6.6  ALBUMIN 3.0*  AST 25  ALT 17  ALKPHOS 84  BILITOT 2.2*   PT/INR Recent Labs    11/18/22 1140  LABPROT 18.7*  INR 1.5*    STUDIES: CT Head Wo Contrast  Result Date: 11/18/2022 CLINICAL DATA:  Head trauma.  Altered mental status. EXAM: CT HEAD WITHOUT CONTRAST TECHNIQUE: Contiguous axial images were obtained from the base of the skull through the vertex without intravenous contrast. RADIATION DOSE REDUCTION: This exam was performed according to the departmental dose-optimization program which includes automated exposure control, adjustment of the mA and/or kV according to patient size and/or use of iterative reconstruction technique. COMPARISON:  10/11/2022 FINDINGS: Brain: Chronic brain atrophy. No sign of acute infarction, mass lesion, hemorrhage, hydrocephalus or extra-axial collection. Vascular: There is atherosclerotic calcification of the major vessels at the base of the brain. Skull: Negative  Sinuses/Orbits: Clear/normal Other: None IMPRESSION: No acute or traumatic finding. Chronic brain atrophy. Electronically Signed   By: Paulina Fusi M.D.   On: 11/18/2022 12:32      Impression / Plan:   Impression: 1.  Hematochezia: BUN elevated, hemoglobin down to a point from a month ago, per nursing facility bright red bloody bowel movements which started on 11/11, the last noted yesterday evening per daughter, Eliquis last dose on 11/11 PM, also complaining of some abdominal discomfort, last colonoscopy in 2016 with signs of radiation proctitis?,  Daughter reports chronic diarrhea, apparently had a large formed stool per the nursing facility which then came with bleeding; concern for sterile coral colitis versus diverticular bleed versus proctitis vs upper GI bleeding with elevated BUN 2.  Anemia: With above 3. Parkinsonism 4. Stroke: on Eliquis- last dose 11/11 pm 5. IBS  Plan: Continue to monitor hgb with transfusion as needed <7 Will plan for flex sigmoidoscopy with Dr. Meridee Score.  Did discuss risks and benefits with the patient and her family.  They do not want her to have a colonoscopy if we can avoid this.  This is due to the bowel prep. If  patient has a large amount of bleeding overnight would recommend a nuc med study (CTA unable to be completed due to elevated creatinine) Will order some gentle laxatives today including MiraLAX x 2 and Dulcolax x 2 and patient will have tapwater enemas  prior to procedure tomorrow Patient can have clear liquid diet today and n.p.o. at midnight Started patient on Pantoprazole 40mg  BID to cover for possibility of UGI bleed   Thank you for your kind consultation, we will continue to follow.  Violet Baldy Zebbie Ace  11/18/2022, 2:18 PM

## 2022-11-18 NOTE — H&P (Signed)
NAME:  [name], MRN:  [id number], DOB:  [birth year], LOS: 0 ADMISSION DATE:  11/18/2022, CONSULTATION DATE:  11/18/22 REFERRING MD:  Silverio Lay - EM , CHIEF COMPLAINT:  Blood in stool  History of Present Illness:  88 yo F PMH  dementia, severe protein calorie malnutrition, nursing home resident, vte on eliquis and s/p IVC filter, falls, endometrial cancer who presented to the ED 11/13 after blood was noted in her stool at facility + concomitant agitation which is a change from pts baseline (baseline Ox1)   CT a/p acquired in ED which revealed abdominal free air, c/f perforated diverticulum. She was seen by GI who rec flex sig-- but suspect this rec was prior to CT resulted.  CCS was consulted in setting of CT findings and rec ongoing medical management, no urgent indication for OR. She was increasingly agitated in ED and was given ativan which dropped her Bps to SBP 70s. Given IVF for this  PCCM consulted for admission in this setting    Pertinent  Medical History  Advanced dementia Sev protein calorie malnutrition  DVT Chronic AC Endometrial cancer Falls CKD Cachexia   Significant Hospital Events: Including procedures, antibiotic start and stop dates in addition to other pertinent events   11/13 ED w agitation + bloody stool, found to have contained perf. GI and CCS consulted. Admit to ICU, no urgent operative intervention   Interim History / Subjective:  Pressures improved prior to IVF   Objective   Blood pressure (!) 122/56, pulse (!) 54, temperature (!) 100.7 F (38.2 C), temperature source Rectal, resp. rate 14, height 5\' 7"  (1.702 m), weight 45.4 kg, SpO2 (!) 84%.        Intake/Output Summary (Last 24 hours) at 11/18/2022 1716 Last data filed at 11/18/2022 1239 Gross per 24 hour  Intake 10 ml  Output --  Net 10 ml   Filed Weights   11/18/22 1217  Weight: 45.4 kg    Examination: General: chronically and acutely ill elderly frail cachectic F  HENT: Temporal  muscle wasting anicteric sclera pink mm  Lungs: symmetrical chest expansion, unlabored resp  Cardiovascular: brady  Abdomen: soft, seemingly generalized tenderness Extremities: decr muscle mass and tone  Neuro: Awake, moving spontaneously, does not follow commands  GU: defer  Resolved Hospital Problem list     Assessment & Plan:   Perforated viscous Hypotension, medication related vs sepsis Advanced dementia with agitation  Severe protein calorie malnutrition  Hyperbilirubinemia  BRBPR // GIB Hx DVT Chronic AC Recent fall Parkinsons CKD Anemia Hx endometrial cancer Cachexia  Hx CVA Hx sz disorder  -LA normal. WBC up to 18.  -hypotension after ativan admin, resolved prior to IVF bolus which is now ongoing P -admit to SDU -NPO -CCS and GI are following -- felt no urgent OR indication at present -possible flex sig with GI tomorrow. BID PPI  -we will do zosyn, IVF  -check another LA -AM Labs  -PRN antipsychotic. Would avoid BZDs  -would rec holding her eliquis and not ever restarting, especially in context of falls and declining functional status. It appears that she has an IVC filter.  -Palliative care consult in this frail elderly pt w adv dementia, who is full code and here w perforated viscous   Best Practice (right click and "Reselect all SmartList Selections" daily)   Diet/type: NPO DVT prophylaxis: SCD GI prophylaxis: PPI Lines: N/A Foley:  N/A Code Status:  full code Last date of multidisciplinary goals of care discussion [pending. No  family available at time of admission ]  Labs   CBC: Recent Labs  Lab 11/18/22 1140 11/18/22 1205  WBC 17.8*  --   NEUTROABS 15.8*  --   HGB 9.3* 10.5*  HCT 29.7* 31.0*  MCV 98.7  --   PLT 196  --     Basic Metabolic Panel: Recent Labs  Lab 11/18/22 1140 11/18/22 1205  NA 136 139  K 4.6 4.6  CL 103 104  CO2 25  --   GLUCOSE 91 93  BUN 34* 42*  CREATININE 1.49* 1.70*  CALCIUM 9.5  --    GFR: Estimated  Creatinine Clearance: 17 mL/min (A) (by C-G formula based on SCr of 1.7 mg/dL (H)). Recent Labs  Lab 11/18/22 1140 11/18/22 1150 11/18/22 1413  WBC 17.8*  --   --   LATICACIDVEN  --  1.2 1.0    Liver Function Tests: Recent Labs  Lab 11/18/22 1140  AST 25  ALT 17  ALKPHOS 84  BILITOT 2.2*  PROT 6.6  ALBUMIN 3.0*   No results for input(s): "LIPASE", "AMYLASE" in the last 168 hours. No results for input(s): "AMMONIA" in the last 168 hours.  ABG    Component Value Date/Time   PHART 7.453 (H) 08/13/2008 1505   PCO2ART 42.4 08/13/2008 1505   PO2ART 208.0 (H) 08/13/2008 1505   HCO3 29.2 (H) 08/13/2008 1505   TCO2 28 11/18/2022 1205   ACIDBASEDEF 0.3 08/09/2008 0625   O2SAT 99.5 08/13/2008 1505     Coagulation Profile: Recent Labs  Lab 11/18/22 1140  INR 1.5*    Cardiac Enzymes: No results for input(s): "CKTOTAL", "CKMB", "CKMBINDEX", "TROPONINI" in the last 168 hours.  HbA1C: Hgb A1c MFr Bld  Date/Time Value Ref Range Status  04/07/2018 05:38 AM 5.2 4.8 - 5.6 % Final    Comment:    (NOTE) Pre diabetes:          5.7%-6.4% Diabetes:              >6.4% Glycemic control for   <7.0% adults with diabetes     CBG: No results for input(s): "GLUCAP" in the last 168 hours.  Review of Systems:   Unable to obtain due to baseline cognitive status   Past Medical History:  She,  has a past medical history of Anorexia, Anxiety, Blood transfusion without reported diagnosis, Chronic diarrhea, Clotting disorder (HCC), Dementia (HCC), Depression, DVT (deep venous thrombosis) (HCC), Endometrial ca (HCC), GERD (gastroesophageal reflux disease), Hiatal hernia, History of D&C, History of sinus surgery, Hypokalemia, IBS (irritable bowel syndrome), Insomnia, Insomnia, Multiple thyroid nodules (12/2005), Neuropathy, Parkinson's disease (HCC), Pulmonary embolus (HCC), Radiation (Jan.3-Feb.6,2008), Radiation (Feb.13,20 and 27/2008), Seizure disorder (HCC), and Stroke (HCC).   Surgical  History:   Past Surgical History:  Procedure Laterality Date   ABDOMINAL HYSTERECTOMY  08/11/05   tah,bso,pelvic lymphadenectomy   ankle fracture repair Left    antroscopy     maxillary   CHOLECYSTECTOMY     COLONOSCOPY  2012   benign colonic polyps   ESOPHAGOGASTRODUODENOSCOPY  2012   neg   IVC Filter  2007   s/p DVT and P.E>   LAPAROSCOPIC ASSISTED RADICAL VAGINAL HYSTERECTOMY W/ NODE BIOPSY  08/11/05   LYMPHADENECTOMY     rotator cuff repair Right      Social History:   reports that she has never smoked. She has never used smokeless tobacco. She reports that she does not drink alcohol and does not use drugs.   Family History:  Her family  history includes Brain cancer in her brother; Breast cancer in her sister; Lung cancer in her brother; Stomach cancer in her sister. There is no history of Colon cancer.   Allergies Allergies  Allergen Reactions   Aspridrox [Aspirin Buf(Alhyd-Mghyd-Cacar)] Other (See Comments)    Unknown  Not documented on MAR   Codeine Other (See Comments)    Unknown   Darvocet [Propoxyphene N-Acetaminophen] Other (See Comments)    Unknown  Not documented on MAR   Esomeprazole Magnesium Other (See Comments)    Unknown  Not documented on MAR   Iodine Other (See Comments)    unknown   Morphine And Codeine Other (See Comments)    unknown   Sulfa Antibiotics Other (See Comments)    Unknown  Not documented on MAR   Sulfate Other (See Comments)    Unknown  Not documented on MAR   Tramadol Other (See Comments)    Unknown  Not documented on MAR     Home Medications  Prior to Admission medications   Medication Sig Start Date End Date Taking? Authorizing Provider  acetaminophen (TYLENOL) 650 MG CR tablet Take 1,300 mg by mouth every 8 (eight) hours as needed for pain.   Yes [provider]  apixaban (ELIQUIS) 2.5 MG TABS tablet Take 2.5 mg by mouth 2 (two) times daily.   Yes [provider]  calcium carbonate (TUMS EX) 750 MG  chewable tablet Chew 750 mg by mouth 2 (two) times daily.   Yes [provider]  carbidopa-levodopa (SINEMET IR) 25-100 MG tablet Take 1 tablet by mouth 4 (four) times daily. 1000, 1400, 1800, 2200   Yes [provider]  cholecalciferol (VITAMIN D) 1000 UNITS tablet Take 1,000 Units by mouth daily.   Yes [provider]  cholestyramine (QUESTRAN) 4 g packet Take 1 packet (4 g total) by mouth daily. Take 1.5 hours away from other medications. 03/11/16  Yes Zehr, Princella Pellegrini, PA-C  Cyanocobalamin (VITAMIN B 12) 100 MCG LOZG Take 1,000 mcg by mouth daily.   Yes [provider]  escitalopram (LEXAPRO) 5 MG tablet Take 1 tablet (5 mg total) by mouth daily. Patient taking differently: Take 10 mg by mouth daily. 04/08/18  Yes Ali Lowe, MD  famotidine (PEPCID) 20 MG tablet Take 20 mg by mouth daily.   Yes [provider]  loperamide (IMODIUM A-D) 2 MG tablet Take 4 mg by mouth in the morning, at noon, and at bedtime.   Yes [provider]  magnesium gluconate (MAGONATE) 500 MG tablet Take 500 mg by mouth 2 (two) times daily.   Yes [provider]  mirtazapine (REMERON) 15 MG tablet Take 15 mg by mouth at bedtime.   Yes [provider]  NON FORMULARY Take 120 mLs by mouth 2 (two) times daily. Med Pass   Yes [provider]  NONFORMULARY OR COMPOUNDED ITEM Apply 1 Application topically daily as needed (Severe anxiety.). Ativan Gel: Apply (1mg /ml) 1mL once every 24 hours as needed.   Yes [provider]  OLANZapine (ZYPREXA) 10 MG tablet Take 10 mg by mouth at bedtime.   Yes [provider]  Skin Protectants, Misc. (MINERIN CREME EX) Apply 1 Application topically daily.   Yes [provider]     Critical care time: na      Tessie Fass MSN, AGACNP-BC [facility] Pulmonary/Critical Care Medicine Amion for pager  11/18/2022, 5:34 PM

## 2022-11-18 NOTE — ED Notes (Signed)
ED TO INPATIENT HANDOFF REPORT  ED Nurse Name and Phone #: Linus Orn Name/Age/Gender [name] 88 y.o. female Room/Bed: WA04/WA04  Code Status   Code Status: Prior  Home/SNF/Other Nursing Home Patient oriented to: self Is this baseline? Yes   Triage Complete: Triage complete  Chief Complaint Intestinal perforation [facility]) [K63.1]  Triage Note Patient arrived via [facility] from [facility] Per EMS reports blood in stool yesterday. Patient is oriented x1 which is baseline, but was very agitated this morning which is new per daughter.   Allergies Allergies  Allergen Reactions   Aspridrox [Aspirin Buf(Alhyd-Mghyd-Cacar)] Other (See Comments)    Unknown  Not documented on MAR   Codeine Other (See Comments)    Unknown   Darvocet [Propoxyphene N-Acetaminophen] Other (See Comments)    Unknown  Not documented on MAR   Esomeprazole Magnesium Other (See Comments)    Unknown  Not documented on MAR   Iodine Other (See Comments)    unknown   Morphine And Codeine Other (See Comments)    unknown   Sulfa Antibiotics Other (See Comments)    Unknown  Not documented on MAR   Sulfate Other (See Comments)    Unknown  Not documented on MAR   Tramadol Other (See Comments)    Unknown  Not documented on MAR    Level of Care/Admitting Diagnosis ED Disposition     ED Disposition  Admit   Condition  --   Comment  Hospital Area: [facility] [[id number]]  Level of Care: Stepdown [14]  Admit to SDU based on following criteria: Severe physiological/psychological symptoms:  Any diagnosis requiring assessment & intervention at least every 4 hours on an ongoing basis to obtain desired patient outcomes including stability and rehabilitation  May admit patient to [facility] or [facility] if equivalent level of care is available:: Yes  Covid Evaluation: Asymptomatic - no recent exposure (last 10 days) testing not required  Diagnosis: Intestinal  perforation [facility]) [[id number]]  Admitting Physician: Lorin Glass [[id number]]  Attending Physician: Lorin Glass [[id number]]  Certification:: I certify this patient will need inpatient services for at least 2 midnights  Expected Medical Readiness: 11/25/2022          B Medical/Surgery History Past Medical History:  Diagnosis Date   Anorexia    Anxiety    Blood transfusion without reported diagnosis    1970's   Chronic diarrhea    CKD (chronic kidney disease)    Clotting disorder (HCC)    Dementia (HCC)    Depression    DVT (deep venous thrombosis) (HCC)    Endometrial ca (HCC)    GERD (gastroesophageal reflux disease)    Hiatal hernia    History of D&C    History of sinus surgery    Hypokalemia    IBS (irritable bowel syndrome)    Insomnia    Insomnia    Multiple thyroid nodules 12/2005   b/l nodules thyroid    Neuropathy    Parkinson's disease (HCC)    Pulmonary embolus (HCC)    Radiation Jan.3-Feb.6,2008   External Beam to pelvis   Radiation Feb.13,20 and 27/2008   Intracavitary brachytherapy   Seizure disorder (HCC)    Stroke (HCC)    R brain Left hemiparesis mild   Past Surgical History:  Procedure Laterality Date   ABDOMINAL HYSTERECTOMY  08/11/05   tah,bso,pelvic lymphadenectomy   ankle fracture repair Left    antroscopy     maxillary  CHOLECYSTECTOMY     COLONOSCOPY  2012   benign colonic polyps   ESOPHAGOGASTRODUODENOSCOPY  2012   neg   IVC Filter  2007   s/p DVT and P.E>   LAPAROSCOPIC ASSISTED RADICAL VAGINAL HYSTERECTOMY W/ NODE BIOPSY  08/11/05   LYMPHADENECTOMY     rotator cuff repair Right      A IV Location/Drains/Wounds Patient Lines/Drains/Airways Status     Active Line/Drains/Airways     Name Placement date Placement time Site Days   Peripheral IV 11/18/22 20 G 1" Anterior;Left;Proximal Forearm 11/18/22  1135  Forearm  less than 1            Intake/Output Last 24 hours  Intake/Output Summary (Last 24 hours) at 11/18/2022  2016 Last data filed at 11/18/2022 1239 Gross per 24 hour  Intake 10 ml  Output --  Net 10 ml    Labs/Imaging Results for orders placed or performed during the hospital encounter of 11/18/22 (from the past 48 hour(s))  Comprehensive metabolic panel     Status: Abnormal   Collection Time: 11/18/22 11:40 AM  Result Value Ref Range   Sodium 136 135 - 145 mmol/L   Potassium 4.6 3.5 - 5.1 mmol/L    Comment: HEMOLYSIS AT THIS LEVEL MAY AFFECT RESULT   Chloride 103 98 - 111 mmol/L   CO2 25 22 - 32 mmol/L   Glucose, Bld 91 70 - 99 mg/dL    Comment: Glucose reference range applies only to samples taken after fasting for at least 8 hours.   BUN 34 (H) 8 - 23 mg/dL   Creatinine, Ser 1.02 (H) 0.44 - 1.00 mg/dL   Calcium 9.5 8.9 - 72.5 mg/dL   Total Protein 6.6 6.5 - 8.1 g/dL   Albumin 3.0 (L) 3.5 - 5.0 g/dL   AST 25 15 - 41 U/L    Comment: HEMOLYSIS AT THIS LEVEL MAY AFFECT RESULT   ALT 17 0 - 44 U/L    Comment: HEMOLYSIS AT THIS LEVEL MAY AFFECT RESULT   Alkaline Phosphatase 84 38 - 126 U/L   Total Bilirubin 2.2 (H) <1.2 mg/dL    Comment: HEMOLYSIS AT THIS LEVEL MAY AFFECT RESULT   GFR, Estimated 34 (L) >60 mL/min    Comment: (NOTE) Calculated using the CKD-EPI Creatinine Equation (2021)    Anion gap 8 5 - 15    Comment: Performed at [facility], [address], [place], [place] [address]  CBC with Differential     Status: Abnormal   Collection Time: 11/18/22 11:40 AM  Result Value Ref Range   WBC 17.8 (H) 4.0 - 10.5 K/uL   RBC 3.01 (L) 3.87 - 5.11 MIL/uL   Hemoglobin 9.3 (L) 12.0 - 15.0 g/dL   HCT 03.4 (L) 74.2 - 59.5 %   MCV 98.7 80.0 - 100.0 fL   MCH 30.9 26.0 - 34.0 pg   MCHC 31.3 30.0 - 36.0 g/dL   RDW 63.8 75.6 - 43.3 %   Platelets 196 150 - 400 K/uL   nRBC 0.0 0.0 - 0.2 %   Neutrophils Relative % 89 %   Neutro Abs 15.8 (H) 1.7 - 7.7 K/uL   Lymphocytes Relative 5 %   Lymphs Abs 0.9 0.7 - 4.0 K/uL   Monocytes Relative 5 %   Monocytes Absolute 1.0  0.1 - 1.0 K/uL   Eosinophils Relative 0 %   Eosinophils Absolute 0.0 0.0 - 0.5 K/uL   Basophils Relative 0 %   Basophils Absolute 0.0  0.0 - 0.1 K/uL   Immature Granulocytes 1 %   Abs Immature Granulocytes 0.15 (H) 0.00 - 0.07 K/uL    Comment: Performed at [facility], [address], [place], [place] [address]  Protime-INR     Status: Abnormal   Collection Time: 11/18/22 11:40 AM  Result Value Ref Range   Prothrombin Time 18.7 (H) 11.4 - 15.2 seconds   INR 1.5 (H) 0.8 - 1.2    Comment: (NOTE) INR goal varies based on device and disease states. Performed at [facility], [address], [place], [place] [address]   APTT     Status: None   Collection Time: 11/18/22 11:40 AM  Result Value Ref Range   aPTT 26 24 - 36 seconds    Comment: Performed at [facility], [address] [place], [place] [address]  I-Stat Lactic Acid, ED     Status: None   Collection Time: 11/18/22 11:50 AM  Result Value Ref Range   Lactic Acid, Venous 1.2 0.5 - 1.9 mmol/L  Type and screen [facility]     Status: None   Collection Time: 11/18/22 12:00 PM  Result Value Ref Range   ABO/RH(D) B POS    Antibody Screen NEG    Sample Expiration      11/21/2022,2359 Performed at [facility], [address] [place], [place] [address]   Resp panel by RT-PCR (RSV, Flu A&B, Covid) Anterior Nasal Swab     Status: None   Collection Time: 11/18/22 12:02 PM   Specimen: Anterior Nasal Swab  Result Value Ref Range   SARS Coronavirus 2 by RT PCR NEGATIVE NEGATIVE    Comment: (NOTE) SARS-CoV-2 target nucleic acids are NOT DETECTED.  The SARS-CoV-2 RNA is generally detectable in upper respiratory specimens during the acute phase of infection. The lowest concentration of SARS-CoV-2 viral copies this assay can detect is 138 copies/mL. A negative result does not preclude SARS-Cov-2 infection and should not be used as the  sole basis for treatment or other patient management decisions. A negative result may occur with  improper specimen collection/handling, submission of specimen other than nasopharyngeal swab, presence of viral mutation(s) within the areas targeted by this assay, and inadequate number of viral copies(<138 copies/mL). A negative result must be combined with clinical observations, patient history, and epidemiological information. The expected result is Negative.  Fact Sheet for Patients:  BloggerCourse.com  Fact Sheet for Healthcare Providers:  SeriousBroker.it  This test is no t yet approved or cleared by the [place] FDA and  has been authorized for detection and/or diagnosis of SARS-CoV-2 by FDA under an Emergency Use Authorization (EUA). This EUA will remain  in effect (meaning this test can be used) for the duration of the COVID-19 declaration under Section 564(b)(1) of the Act, 21 U.S.C.section 360bbb-3(b)(1), unless the authorization is terminated  or revoked sooner.       Influenza A by PCR NEGATIVE NEGATIVE   Influenza B by PCR NEGATIVE NEGATIVE    Comment: (NOTE) The Xpert Xpress SARS-CoV-2/FLU/RSV plus assay is intended as an aid in the diagnosis of influenza from Nasopharyngeal swab specimens and should not be used as a sole basis for treatment. Nasal washings and aspirates are unacceptable for Xpert Xpress SARS-CoV-2/FLU/RSV testing.  Fact Sheet for Patients: BloggerCourse.com  Fact Sheet for Healthcare Providers: SeriousBroker.it  This test is not yet approved or cleared by the [place] FDA and has been authorized for detection and/or  diagnosis of SARS-CoV-2 by FDA under an Emergency Use Authorization (EUA). This EUA will remain in effect (meaning this test can be used) for the duration of the COVID-19 declaration under Section 564(b)(1) of the Act, 21  U.S.C. section 360bbb-3(b)(1), unless the authorization is terminated or revoked.     Resp Syncytial Virus by PCR NEGATIVE NEGATIVE    Comment: (NOTE) Fact Sheet for Patients: BloggerCourse.com  Fact Sheet for Healthcare Providers: SeriousBroker.it  This test is not yet approved or cleared by the [place] FDA and has been authorized for detection and/or diagnosis of SARS-CoV-2 by FDA under an Emergency Use Authorization (EUA). This EUA will remain in effect (meaning this test can be used) for the duration of the COVID-19 declaration under Section 564(b)(1) of the Act, 21 U.S.C. section 360bbb-3(b)(1), unless the authorization is terminated or revoked.  Performed at [facility], [address], [place], [place] [address]   I-stat chem 8, ED (not at [facility], [facility] or [facility])     Status: Abnormal   Collection Time: 11/18/22 12:05 PM  Result Value Ref Range   Sodium 139 135 - 145 mmol/L   Potassium 4.6 3.5 - 5.1 mmol/L   Chloride 104 98 - 111 mmol/L   BUN 42 (H) 8 - 23 mg/dL   Creatinine, Ser 1.91 (H) 0.44 - 1.00 mg/dL   Glucose, Bld 93 70 - 99 mg/dL    Comment: Glucose reference range applies only to samples taken after fasting for at least 8 hours.   Calcium, Ion 1.20 1.15 - 1.40 mmol/L   TCO2 28 22 - 32 mmol/L   Hemoglobin 10.5 (L) 12.0 - 15.0 g/dL   HCT 47.8 (L) 29.5 - 62.1 %  I-Stat Lactic Acid, ED     Status: None   Collection Time: 11/18/22  2:13 PM  Result Value Ref Range   Lactic Acid, Venous 1.0 0.5 - 1.9 mmol/L  Urinalysis, w/ Reflex to Culture (Infection Suspected) -Urine, Catheterized     Status: Abnormal   Collection Time: 11/18/22  2:15 PM  Result Value Ref Range   Specimen Source URINE, CATHETERIZED    Color, Urine YELLOW YELLOW   APPearance HAZY (A) CLEAR   Specific Gravity, Urine 1.015 1.005 - 1.030   pH 6.0 5.0 - 8.0   Glucose, UA NEGATIVE NEGATIVE mg/dL   Hgb urine dipstick SMALL (A)  NEGATIVE   Bilirubin Urine NEGATIVE NEGATIVE   Ketones, ur 5 (A) NEGATIVE mg/dL   Protein, ur NEGATIVE NEGATIVE mg/dL   Nitrite NEGATIVE NEGATIVE   Leukocytes,Ua NEGATIVE NEGATIVE   RBC / HPF 0-5 0 - 5 RBC/hpf   WBC, UA 6-10 0 - 5 WBC/hpf    Comment:        Reflex urine culture not performed if WBC <=10, OR if Squamous epithelial cells >5. If Squamous epithelial cells >5 suggest recollection.    Bacteria, UA RARE (A) NONE SEEN   Squamous Epithelial / HPF 0-5 0 - 5 /HPF   Amorphous Crystal PRESENT     Comment: Performed at [facility], [address] [place], [place] [address]  POC occult blood, ED     Status: Abnormal   Collection Time: 11/18/22  2:21 PM  Result Value Ref Range   Fecal Occult Bld POSITIVE (A) NEGATIVE  I-Stat CG4 Lactic Acid     Status: Abnormal   Collection Time: 11/18/22  6:14 PM  Result Value Ref Range   Lactic Acid, Venous 2.4 (HH) 0.5 - 1.9 mmol/L  Comment NOTIFIED PHYSICIAN    DG Chest Port 1 View  Result Date: 11/18/2022 CLINICAL DATA:  Altered mental status. EXAM: PORTABLE CHEST 1 VIEW COMPARISON:  Chest radiograph dated October 10, 2022. FINDINGS: Patient is rotated to the right. The heart size and mediastinal contours are within normal limits. No focal consolidation, pleural effusion, or pneumothorax. No acute osseous abnormality. IMPRESSION: No acute cardiopulmonary findings. Electronically Signed   By: Hart Robinsons M.D.   On: 11/18/2022 15:58   CT ABDOMEN PELVIS WO CONTRAST  Result Date: 11/18/2022 CLINICAL DATA:  Left lower quadrant abdominal pain. EXAM: CT ABDOMEN AND PELVIS WITHOUT CONTRAST TECHNIQUE: Multidetector CT imaging of the abdomen and pelvis was performed following the standard protocol without IV contrast. RADIATION DOSE REDUCTION: This exam was performed according to the departmental dose-optimization program which includes automated exposure control, adjustment of the mA and/or kV according to patient size  and/or use of iterative reconstruction technique. COMPARISON:  April 20, 2021. FINDINGS: Lower chest: Minimal bibasilar subsegmental atelectasis is noted. Hepatobiliary: Status post cholecystectomy. No biliary dilatation. Multiple calcified granulomas are noted in the patent parenchyma which are unchanged. Pancreas: Unremarkable. No pancreatic ductal dilatation or surrounding inflammatory changes. Spleen: Normal in size without focal abnormality. Adrenals/Urinary Tract: Evaluation is limited due to extensive amount of air seen in the retroperitoneum superiorly which extends into the mediastinum. No hydronephrosis or renal obstruction is noted. Urinary bladder is unremarkable. Stomach/Bowel: Stomach is unremarkable. Mildly dilated small bowel loops are noted in the pelvis of uncertain etiology. Stool is noted in the sigmoid colon. There is moderate wall thickening of the sigmoid colon and a large amount of free air is noted adjacent to it concerning for perforation. This may account for the free air seen in the more superior portion of the retroperitoneum. Vascular/Lymphatic: Aortic atherosclerosis. No enlarged abdominal or pelvic lymph nodes. IVC filter is noted in infrarenal position. Reproductive: Status post hysterectomy. No adnexal masses. Other: No hernia is noted. Musculoskeletal: No acute or significant osseous findings. IMPRESSION: Moderate wall thickening of the sigmoid colon is noted with a large amount of free air adjacent to it concerning for perforation, potentially due to diverticulitis or colitis. Extensive amount of emphysema is noted in the retroperitoneal region most likely secondary to this, which extends into the visualized portion of the mediastinum. Mildly dilated small bowel loops are noted most likely representing ileus secondary to inflammation. Critical Value/emergent results were called by telephone at the time of interpretation on 11/18/2022 at 3:49 pm to provider [facility] , who  verbally acknowledged these results. Electronically Signed   By: Lupita Raider M.D.   On: 11/18/2022 15:49   CT Head Wo Contrast  Result Date: 11/18/2022 CLINICAL DATA:  Head trauma.  Altered mental status. EXAM: CT HEAD WITHOUT CONTRAST TECHNIQUE: Contiguous axial images were obtained from the base of the skull through the vertex without intravenous contrast. RADIATION DOSE REDUCTION: This exam was performed according to the departmental dose-optimization program which includes automated exposure control, adjustment of the mA and/or kV according to patient size and/or use of iterative reconstruction technique. COMPARISON:  10/11/2022 FINDINGS: Brain: Chronic brain atrophy. No sign of acute infarction, mass lesion, hemorrhage, hydrocephalus or extra-axial collection. Vascular: There is atherosclerotic calcification of the major vessels at the base of the brain. Skull: Negative Sinuses/Orbits: Clear/normal Other: None IMPRESSION: No acute or traumatic finding. Chronic brain atrophy. Electronically Signed   By: Paulina Fusi M.D.   On: 11/18/2022 12:32    Pending Labs [employer] (From  admission, onward)     Start     Ordered   11/19/22 0500  CBC  Daily,   R      11/18/22 1712   11/19/22 0500  Basic metabolic panel  Daily,   R      11/18/22 1712   11/19/22 0500  Magnesium  Daily,   R      11/18/22 1712   11/19/22 0500  Phosphorus  Daily,   R      11/18/22 1712   11/18/22 1115  Blood Culture (routine x 2)  (Septic presentation on arrival (screening labs, nursing and treatment orders for obvious sepsis))  BLOOD CULTURE X 2,   STAT      11/18/22 1116            Vitals/Pain Today's Vitals   11/18/22 1600 11/18/22 1630 11/18/22 1700 11/18/22 2004  BP: (!) 82/57 98/68 (!) 122/56   Pulse: (!) 137 61 (!) 54   Resp:   14   Temp:    97.6 F (36.4 C)  TempSrc:    Axillary  SpO2: (!) 77% 93% (!) 84%   Weight:      Height:      PainSc:        Isolation Precautions No active  isolations  Medications Medications  sodium chloride flush (NS) 0.9 % injection 10 mL (10 mLs Intravenous Given 11/18/22 1239)  haloperidol lactate (HALDOL) injection 2 mg (has no administration in time range)  lactated ringers infusion ( Intravenous New Bag/Given 11/18/22 1743)  pantoprazole (PROTONIX) injection 40 mg (has no administration in time range)  piperacillin-tazobactam (ZOSYN) IVPB 2.25 g (has no administration in time range)  lactated ringers bolus 1,000 mL (0 mLs Intravenous Stopped 11/18/22 1400)  cefTRIAXone (ROCEPHIN) 2 g in sodium chloride 0.9 % 100 mL IVPB (0 g Intravenous Stopped 11/18/22 1400)  metroNIDAZOLE (FLAGYL) IVPB 500 mg (0 mg Intravenous Stopped 11/18/22 1400)  LORazepam (ATIVAN) injection 0.5 mg (0.5 mg Intravenous Given 11/18/22 1545)  sodium chloride 0.9 % bolus 1,000 mL (0 mLs Intravenous Paused 11/18/22 1742)  piperacillin-tazobactam (ZOSYN) IVPB 3.375 g (3.375 g Intravenous New Bag/Given 11/18/22 1743)  haloperidol lactate (HALDOL) injection 2 mg (2 mg Intramuscular Given 11/18/22 1808)    Mobility Walks with walker or wheelchair     Focused Assessments Alter mental status   R Recommendations: See Admitting Provider Note  Report given to:   Additional Notes: Patient alert to self, per previous nurse pt is combative. Current nurse has not experience any combative behavior. Patient is a high fall risk as she altered and is actively trying to get out of bed. Pt has a posey on and mittens.

## 2022-11-18 NOTE — ED Triage Notes (Signed)
Patient arrived via [facility] from [facility]. Per EMS reports blood in stool yesterday. Patient is oriented x1 which is baseline, but was very agitated this morning which is new per daughter.

## 2022-11-18 NOTE — ED Provider Notes (Signed)
  Physical Exam  BP (!) 111/52   Pulse (!) 107   Temp (!) 100.7 F (38.2 C) (Rectal)   Resp 12   Ht 5\' 7"  (1.702 m)   Wt 45.4 kg   SpO2 (!) 77%   BMI 15.68 kg/m   Physical Exam  Procedures  Procedures  ED Course / MDM    Medical Decision Making Care assumed at 3:30 PM.  Patient is here with fever and blood in her stool.  CT showed perforated diverticulitis versus colitis.  Surgery was consulted and signed out pending surgery consult and admission  4:44 PM Surgery saw patient and patient unfortunately became hypotensive to the 70s.  I ordered a second liter bolus.  Surgery plans to reassess in the evening and does not want to take her emergently to the OR right now.  Her abdomen is minimally tender and not peritonitic.  At this point will consult the ICU as patient is unstable.  5:08 PM As we saw patient and patient's blood pressure improved.  Surgery to reassess in the evening and potentially take her to the OR.  ICU to monitor the patient overnight  CRITICAL CARE Performed by: Richardean Canal   Total critical care time: 39 minutes  Critical care time was exclusive of separately billable procedures and treating other patients.  Critical care was necessary to treat or prevent imminent or life-threatening deterioration.  Critical care was time spent personally by me on the following activities: development of treatment plan with patient and/or surrogate as well as nursing, discussions with consultants, evaluation of patient's response to treatment, examination of patient, obtaining history from patient or surrogate, ordering and performing treatments and interventions, ordering and review of laboratory studies, ordering and review of radiographic studies, pulse oximetry and re-evaluation of patient's condition.   Problems Addressed: Perforated diverticulum: acute illness or injury Sepsis, due to unspecified organism, unspecified whether acute organ dysfunction present [facility]):  acute illness or injury  Amount and/or Complexity of Data Reviewed Labs: ordered. Decision-making details documented in ED Course. Radiology: ordered and independent interpretation performed. Decision-making details documented in ED Course. ECG/medicine tests: ordered and independent interpretation performed. Decision-making details documented in ED Course.  Risk Prescription drug management. Decision regarding hospitalization.          Charlynne Pander, MD 11/18/22 [phone]

## 2022-11-18 NOTE — ED Notes (Signed)
Nurse and Tech notified patients 0/2 saturation low.

## 2022-11-18 NOTE — ED Notes (Signed)
Pt is resting in bed, normal raise and fall of chest noted by nurse.

## 2022-11-18 NOTE — ED Notes (Signed)
Nurse spoke to nurse Sangeeta at [employer]

## 2022-11-18 NOTE — Consult Note (Addendum)
Consult Note  [name] January 22, 1934  [id number].    Requesting MD: Dr. Dalene Seltzer Chief Complaint/Reason for Consult: abdominal free air  HPI:  88 y.o. female with medical history significant for anorexia, DVT/PE on anticoagulation, GERD, depression, stroke, seizure disorder, endometrial cancer, Parkinson's disease dementia who presented to [facility] emergency department from her skilled nursing facility with worsening altered mental status and hematochezia.  At time of my exam patient is alone and does not follow commands or answer questions.  History obtained from discussion with EDP and chart review.  She was noted to have increasing agitation from her baseline and blood in her stool starting 2 days ago.  Upon arrival to the ED she was found to be febrile to 100.7 F and CT scan completed showing likely perforation near the sigmoid colon with intra-abdominal free air.  Last colonoscopy per chart review was 2016 with findings of radiation proctitis and precancerous polyps.  She was evaluated by GI in the ED prior to CT scan results and they were initially planning for flex sigmoidoscopy tomorrow  Blood thinners: Eliquis 2.5 mg last dose 11/11 Past Surgeries: Abdominal hysterectomy, cholecystectomy   ROS: Review of Systems  Unable to perform ROS: Dementia    Family History  Problem Relation Age of Onset   Stomach cancer Sister    Brain cancer Brother    Lung cancer Brother    Breast cancer Sister    Colon cancer Neg Hx     Past Medical History:  Diagnosis Date   Anorexia    Anxiety    Blood transfusion without reported diagnosis    1970's   Chronic diarrhea    Clotting disorder (HCC)    Dementia (HCC)    Depression    DVT (deep venous thrombosis) (HCC)    Endometrial ca (HCC)    GERD (gastroesophageal reflux disease)    Hiatal hernia    History of D&C    History of sinus surgery    Hypokalemia    IBS (irritable bowel syndrome)    Insomnia    Insomnia     Multiple thyroid nodules 12/2005   b/l nodules thyroid    Neuropathy    Parkinson's disease (HCC)    Pulmonary embolus (HCC)    Radiation Jan.3-Feb.6,2008   External Beam to pelvis   Radiation Feb.13,20 and 27/2008   Intracavitary brachytherapy   Seizure disorder (HCC)    Stroke (HCC)    R brain Left hemiparesis mild    Past Surgical History:  Procedure Laterality Date   ABDOMINAL HYSTERECTOMY  08/11/05   tah,bso,pelvic lymphadenectomy   ankle fracture repair Left    antroscopy     maxillary   CHOLECYSTECTOMY     COLONOSCOPY  2012   benign colonic polyps   ESOPHAGOGASTRODUODENOSCOPY  2012   neg   IVC Filter  2007   s/p DVT and P.E>   LAPAROSCOPIC ASSISTED RADICAL VAGINAL HYSTERECTOMY W/ NODE BIOPSY  08/11/05   LYMPHADENECTOMY     rotator cuff repair Right     Social History:  reports that she has never smoked. She has never used smokeless tobacco. She reports that she does not drink alcohol and does not use drugs.  Allergies:  Allergies  Allergen Reactions   Aspridrox [Aspirin Buf(Alhyd-Mghyd-Cacar)] Other (See Comments)    Unknown  Not documented on MAR   Codeine Other (See Comments)    Unknown   Darvocet [Propoxyphene N-Acetaminophen] Other (See Comments)    Unknown  Not documented on MAR   Esomeprazole Magnesium Other (See Comments)    Unknown  Not documented on MAR   Iodine Other (See Comments)    unknown   Morphine And Codeine Other (See Comments)    unknown   Sulfa Antibiotics Other (See Comments)    Unknown  Not documented on MAR   Sulfate Other (See Comments)    Unknown  Not documented on MAR   Tramadol Other (See Comments)    Unknown  Not documented on MAR    (Not in a hospital admission)   Blood pressure (!) 111/52, pulse (!) 107, temperature (!) 100.7 F (38.2 C), temperature source Rectal, resp. rate 12, height 5\' 7"  (1.702 m), weight 45.4 kg, SpO2 (!) 77%. Physical Exam: General: Chronically ill-appearing female laying in bed and  agitated HEENT: head is normocephalic, atraumatic.  Sclera are noninjected.  Pupils equal and round. EOMs intact.  Ears and nose without any masses or lesions.  Mouth is pink and moist Heart: Tachycardic with regular rhythm. No obvious murmurs, gallops, or rubs noted.  Palpable radial pulses bilaterally Lungs: Respiratory effort nonlabored Abd: soft, does not appear distended.  She flinches with palpation across lower abdomen MSK: all 4 extremities are symmetrical with no cyanosis, clubbing, or edema. Skin: warm and dry  Psych: Alert.  Does not follow commands.  Mumbles without discernible words   Results for orders placed or performed during the hospital encounter of 11/18/22 (from the past 48 hour(s))  Comprehensive metabolic panel     Status: Abnormal   Collection Time: 11/18/22 11:40 AM  Result Value Ref Range   Sodium 136 135 - 145 mmol/L   Potassium 4.6 3.5 - 5.1 mmol/L    Comment: HEMOLYSIS AT THIS LEVEL MAY AFFECT RESULT   Chloride 103 98 - 111 mmol/L   CO2 25 22 - 32 mmol/L   Glucose, Bld 91 70 - 99 mg/dL    Comment: Glucose reference range applies only to samples taken after fasting for at least 8 hours.   BUN 34 (H) 8 - 23 mg/dL   Creatinine, Ser 4.09 (H) 0.44 - 1.00 mg/dL   Calcium 9.5 8.9 - 81.1 mg/dL   Total Protein 6.6 6.5 - 8.1 g/dL   Albumin 3.0 (L) 3.5 - 5.0 g/dL   AST 25 15 - 41 U/L    Comment: HEMOLYSIS AT THIS LEVEL MAY AFFECT RESULT   ALT 17 0 - 44 U/L    Comment: HEMOLYSIS AT THIS LEVEL MAY AFFECT RESULT   Alkaline Phosphatase 84 38 - 126 U/L   Total Bilirubin 2.2 (H) <1.2 mg/dL    Comment: HEMOLYSIS AT THIS LEVEL MAY AFFECT RESULT   GFR, Estimated 34 (L) >60 mL/min    Comment: (NOTE) Calculated using the CKD-EPI Creatinine Equation (2021)    Anion gap 8 5 - 15    Comment: Performed at [facility], [address] [place], [place] [address]  CBC with Differential     Status: Abnormal   Collection Time: 11/18/22 11:40 AM  Result Value  Ref Range   WBC 17.8 (H) 4.0 - 10.5 K/uL   RBC 3.01 (L) 3.87 - 5.11 MIL/uL   Hemoglobin 9.3 (L) 12.0 - 15.0 g/dL   HCT 29.5 (L) 62.1 - 30.8 %   MCV 98.7 80.0 - 100.0 fL   MCH 30.9 26.0 - 34.0 pg   MCHC 31.3 30.0 - 36.0 g/dL   RDW 65.7 84.6 - 96.2 %   Platelets 196 150 -  400 K/uL   nRBC 0.0 0.0 - 0.2 %   Neutrophils Relative % 89 %   Neutro Abs 15.8 (H) 1.7 - 7.7 K/uL   Lymphocytes Relative 5 %   Lymphs Abs 0.9 0.7 - 4.0 K/uL   Monocytes Relative 5 %   Monocytes Absolute 1.0 0.1 - 1.0 K/uL   Eosinophils Relative 0 %   Eosinophils Absolute 0.0 0.0 - 0.5 K/uL   Basophils Relative 0 %   Basophils Absolute 0.0 0.0 - 0.1 K/uL   Immature Granulocytes 1 %   Abs Immature Granulocytes 0.15 (H) 0.00 - 0.07 K/uL    Comment: Performed at [facility], [address] [place], [place] [address]  Protime-INR     Status: Abnormal   Collection Time: 11/18/22 11:40 AM  Result Value Ref Range   Prothrombin Time 18.7 (H) 11.4 - 15.2 seconds   INR 1.5 (H) 0.8 - 1.2    Comment: (NOTE) INR goal varies based on device and disease states. Performed at [facility], [address] [place], [place] [address]   APTT     Status: None   Collection Time: 11/18/22 11:40 AM  Result Value Ref Range   aPTT 26 24 - 36 seconds    Comment: Performed at [facility], [address], [place], [place] [address]  I-Stat Lactic Acid, ED     Status: None   Collection Time: 11/18/22 11:50 AM  Result Value Ref Range   Lactic Acid, Venous 1.2 0.5 - 1.9 mmol/L  Type and screen [facility]     Status: None   Collection Time: 11/18/22 12:00 PM  Result Value Ref Range   ABO/RH(D) B POS    Antibody Screen NEG    Sample Expiration      11/21/2022,2359 Performed at [facility], [address], [place], [place] [address]   Resp panel by RT-PCR (RSV, Flu A&B, Covid) Anterior Nasal Swab     Status: None   Collection Time:  11/18/22 12:02 PM   Specimen: Anterior Nasal Swab  Result Value Ref Range   SARS Coronavirus 2 by RT PCR NEGATIVE NEGATIVE    Comment: (NOTE) SARS-CoV-2 target nucleic acids are NOT DETECTED.  The SARS-CoV-2 RNA is generally detectable in upper respiratory specimens during the acute phase of infection. The lowest concentration of SARS-CoV-2 viral copies this assay can detect is 138 copies/mL. A negative result does not preclude SARS-Cov-2 infection and should not be used as the sole basis for treatment or other patient management decisions. A negative result may occur with  improper specimen collection/handling, submission of specimen other than nasopharyngeal swab, presence of viral mutation(s) within the areas targeted by this assay, and inadequate number of viral copies(<138 copies/mL). A negative result must be combined with clinical observations, patient history, and epidemiological information. The expected result is Negative.  Fact Sheet for Patients:  BloggerCourse.com  Fact Sheet for Healthcare Providers:  SeriousBroker.it  This test is no t yet approved or cleared by the [place] FDA and  has been authorized for detection and/or diagnosis of SARS-CoV-2 by FDA under an Emergency Use Authorization (EUA). This EUA will remain  in effect (meaning this test can be used) for the duration of the COVID-19 declaration under Section 564(b)(1) of the Act, 21 U.S.C.section 360bbb-3(b)(1), unless the authorization is terminated  or revoked sooner.       Influenza A by PCR NEGATIVE NEGATIVE   Influenza B by PCR  NEGATIVE NEGATIVE    Comment: (NOTE) The Xpert Xpress SARS-CoV-2/FLU/RSV plus assay is intended as an aid in the diagnosis of influenza from Nasopharyngeal swab specimens and should not be used as a sole basis for treatment. Nasal washings and aspirates are unacceptable for Xpert Xpress  SARS-CoV-2/FLU/RSV testing.  Fact Sheet for Patients: BloggerCourse.com  Fact Sheet for Healthcare Providers: SeriousBroker.it  This test is not yet approved or cleared by the [place] FDA and has been authorized for detection and/or diagnosis of SARS-CoV-2 by FDA under an Emergency Use Authorization (EUA). This EUA will remain in effect (meaning this test can be used) for the duration of the COVID-19 declaration under Section 564(b)(1) of the Act, 21 U.S.C. section 360bbb-3(b)(1), unless the authorization is terminated or revoked.     Resp Syncytial Virus by PCR NEGATIVE NEGATIVE    Comment: (NOTE) Fact Sheet for Patients: BloggerCourse.com  Fact Sheet for Healthcare Providers: SeriousBroker.it  This test is not yet approved or cleared by the [place] FDA and has been authorized for detection and/or diagnosis of SARS-CoV-2 by FDA under an Emergency Use Authorization (EUA). This EUA will remain in effect (meaning this test can be used) for the duration of the COVID-19 declaration under Section 564(b)(1) of the Act, 21 U.S.C. section 360bbb-3(b)(1), unless the authorization is terminated or revoked.  Performed at [facility], [address], [place], [place] [address]   I-stat chem 8, ED (not at [facility], [facility] or [facility])     Status: Abnormal   Collection Time: 11/18/22 12:05 PM  Result Value Ref Range   Sodium 139 135 - 145 mmol/L   Potassium 4.6 3.5 - 5.1 mmol/L   Chloride 104 98 - 111 mmol/L   BUN 42 (H) 8 - 23 mg/dL   Creatinine, Ser 3.24 (H) 0.44 - 1.00 mg/dL   Glucose, Bld 93 70 - 99 mg/dL    Comment: Glucose reference range applies only to samples taken after fasting for at least 8 hours.   Calcium, Ion 1.20 1.15 - 1.40 mmol/L   TCO2 28 22 - 32 mmol/L   Hemoglobin 10.5 (L) 12.0 - 15.0 g/dL   HCT 40.1 (L) 02.7 - 25.3 %  I-Stat Lactic Acid, ED      Status: None   Collection Time: 11/18/22  2:13 PM  Result Value Ref Range   Lactic Acid, Venous 1.0 0.5 - 1.9 mmol/L  POC occult blood, ED     Status: Abnormal   Collection Time: 11/18/22  2:21 PM  Result Value Ref Range   Fecal Occult Bld POSITIVE (A) NEGATIVE   CT Head Wo Contrast  Result Date: 11/18/2022 CLINICAL DATA:  Head trauma.  Altered mental status. EXAM: CT HEAD WITHOUT CONTRAST TECHNIQUE: Contiguous axial images were obtained from the base of the skull through the vertex without intravenous contrast. RADIATION DOSE REDUCTION: This exam was performed according to the departmental dose-optimization program which includes automated exposure control, adjustment of the mA and/or kV according to patient size and/or use of iterative reconstruction technique. COMPARISON:  10/11/2022 FINDINGS: Brain: Chronic brain atrophy. No sign of acute infarction, mass lesion, hemorrhage, hydrocephalus or extra-axial collection. Vascular: There is atherosclerotic calcification of the major vessels at the base of the brain. Skull: Negative Sinuses/Orbits: Clear/normal Other: None IMPRESSION: No acute or traumatic finding. Chronic brain atrophy. Electronically Signed   By: Paulina Fusi M.D.   On: 11/18/2022 12:32      Assessment/Plan Perforated viscus, likely sigmoid colon, intra-abdominal free air  Patient  seen and examined and relevant labs and imaging reviewed.  She has perforated viscus on CT with etiology unclear - could be related to infection/inflammation vs history of radiation vs malignancy. History is limited due to patient's baseline dementia and lack of family/caregiver at bedside at time of my exam.  She is hypotensive with blood pressure 72 systolic and tachycardic in the low 100s during my exam.  Lactic acid was normal.  She has received 1 L fluid bolus in the ED.  Abdominal exam is not impressive but is also limited by her dementia.  Case and plan discussed with Dr. Hillery Hunter.  Do not  recommend proceeding to the OR emergently at this time but she does need close monitoring and further IV fluid resuscitation.  Continue n.p.o. status and IV antibiotics.  Per review of GI note family seemed hesitant to proceed with endoscopic intervention so unsure if they would wish to pursue surgical intervention.  They did confirm with EDP they would like her to remain full code.  We will follow closely and recheck later.   FEN: NPO, IVF ID: [email or web address] VTE: okay for chemical prophylaxis from surgical standpoint. Please continue to hold eliquis  I reviewed Consultant GI notes, last 24 h vitals and pain scores, last 48 h intake and output, last 24 h labs and trends, and last 24 h imaging results and discussed patient case and plan with EDP.   Eric Form, [facility] 11/18/2022, 4:37 PM Please see Amion for pager number during day hours 7:00am-4:30pm

## 2022-11-18 NOTE — ED Notes (Addendum)
Pt agitated and combative, unable and unwilling to take oral medications

## 2022-11-19 DIAGNOSIS — Z7189 Other specified counseling: Secondary | ICD-10-CM

## 2022-11-19 DIAGNOSIS — F0390 Unspecified dementia without behavioral disturbance: Secondary | ICD-10-CM

## 2022-11-19 DIAGNOSIS — Z515 Encounter for palliative care: Secondary | ICD-10-CM | POA: Diagnosis not present

## 2022-11-19 DIAGNOSIS — K5780 Diverticulitis of intestine, part unspecified, with perforation and abscess without bleeding: Secondary | ICD-10-CM | POA: Diagnosis not present

## 2022-11-19 LAB — BASIC METABOLIC PANEL
Anion gap: 8 (ref 5–15)
BUN: 29 mg/dL — ABNORMAL HIGH (ref 8–23)
CO2: 25 mmol/L (ref 22–32)
Calcium: 9.2 mg/dL (ref 8.9–10.3)
Chloride: 106 mmol/L (ref 98–111)
Creatinine, Ser: 1.3 mg/dL — ABNORMAL HIGH (ref 0.44–1.00)
GFR, Estimated: 40 mL/min — ABNORMAL LOW (ref >60)
Glucose, Bld: 86 mg/dL (ref 70–99)
Potassium: 3.9 mmol/L (ref 3.5–5.1)
Sodium: 139 mmol/L (ref 135–145)

## 2022-11-19 LAB — CBC
HCT: 26.7 % — ABNORMAL LOW (ref 36.0–46.0)
Hemoglobin: 8.3 g/dL — ABNORMAL LOW (ref 12.0–15.0)
MCH: 30.5 pg (ref 26.0–34.0)
MCHC: 31.1 g/dL (ref 30.0–36.0)
MCV: 98.2 fL (ref 80.0–100.0)
Platelets: 181 10*3/uL (ref 150–400)
RBC: 2.72 MIL/uL — ABNORMAL LOW (ref 3.87–5.11)
RDW: 13.5 % (ref 11.5–15.5)
WBC: 13.3 10*3/uL — ABNORMAL HIGH (ref 4.0–10.5)
nRBC: 0 % (ref 0.0–0.2)

## 2022-11-19 LAB — MRSA NEXT GEN BY PCR, NASAL: MRSA by PCR Next Gen: NOT DETECTED

## 2022-11-19 LAB — PHOSPHORUS: Phosphorus: 1.9 mg/dL — ABNORMAL LOW (ref 2.5–4.6)

## 2022-11-19 LAB — MAGNESIUM: Magnesium: 2.2 mg/dL (ref 1.7–2.4)

## 2022-11-19 SURGERY — FLEXIBLE SIGMOIDOSCOPY
Anesthesia: Monitor Anesthesia Care

## 2022-11-19 MED ORDER — THIAMINE HCL 100 MG/ML IJ SOLN
100.0000 mg | Freq: Every day | INTRAMUSCULAR | Status: DC
  Administered 2022-11-19 – 2022-11-22 (×4): 100 mg via INTRAVENOUS
  Filled 2022-11-19 (×4): qty 2

## 2022-11-19 MED ORDER — POTASSIUM PHOSPHATES 15 MMOLE/5ML IV SOLN
15.0000 mmol | Freq: Once | INTRAVENOUS | Status: AC
  Administered 2022-11-19: 15 mmol via INTRAVENOUS
  Filled 2022-11-19: qty 5

## 2022-11-19 MED ORDER — FENTANYL CITRATE PF 50 MCG/ML IJ SOSY
12.5000 ug | PREFILLED_SYRINGE | INTRAMUSCULAR | Status: DC | PRN
  Administered 2022-11-19: 12.5 ug via INTRAVENOUS
  Filled 2022-11-19: qty 1

## 2022-11-19 MED ORDER — ORAL CARE MOUTH RINSE: 15.0000 mL | OROMUCOSAL | Status: DC | PRN

## 2022-11-19 MED ORDER — FENTANYL CITRATE PF 50 MCG/ML IJ SOSY: 12.5000 ug | PREFILLED_SYRINGE | INTRAMUSCULAR | Status: DC | PRN

## 2022-11-19 MED ORDER — ONDANSETRON HCL 4 MG/2ML IJ SOLN: 4.0000 mg | Freq: Four times a day (QID) | INTRAMUSCULAR | Status: DC | PRN

## 2022-11-19 MED ORDER — HALOPERIDOL LACTATE 5 MG/ML IJ SOLN
2.0000 mg | Freq: Four times a day (QID) | INTRAMUSCULAR | Status: DC | PRN
  Administered 2022-11-19 – 2022-11-21 (×6): 2 mg via INTRAVENOUS
  Filled 2022-11-19 (×6): qty 1

## 2022-11-19 MED ORDER — PIPERACILLIN-TAZOBACTAM 3.375 G IVPB
3.3750 g | Freq: Three times a day (TID) | INTRAVENOUS | Status: DC
  Administered 2022-11-20 – 2022-11-23 (×10): 3.375 g via INTRAVENOUS
  Filled 2022-11-19 (×11): qty 50

## 2022-11-19 MED ORDER — CARMEX CLASSIC LIP BALM EX OINT
TOPICAL_OINTMENT | CUTANEOUS | Status: DC | PRN
  Filled 2022-11-19: qty 10

## 2022-11-19 MED ORDER — FENTANYL CITRATE PF 50 MCG/ML IJ SOSY
25.0000 ug | PREFILLED_SYRINGE | INTRAMUSCULAR | Status: DC | PRN
  Administered 2022-11-19 – 2022-11-21 (×7): 25 ug via INTRAVENOUS
  Filled 2022-11-19 (×7): qty 1

## 2022-11-19 MED ORDER — DEXTROSE IN LACTATED RINGERS 5 % IV SOLN: INTRAVENOUS | Status: AC

## 2022-11-19 MED ORDER — FAMOTIDINE IN NACL 20-0.9 MG/50ML-% IV SOLN
20.0000 mg | Freq: Every day | INTRAVENOUS | Status: DC
  Administered 2022-11-19 – 2022-11-21 (×3): 20 mg via INTRAVENOUS
  Filled 2022-11-19 (×3): qty 50

## 2022-11-19 MED ORDER — ORAL CARE MOUTH RINSE
15.0000 mL | OROMUCOSAL | Status: DC
  Administered 2022-11-20 – 2022-11-24 (×11): 15 mL via OROMUCOSAL

## 2022-11-19 MED ORDER — ACETAMINOPHEN 650 MG RE SUPP: 650.0000 mg | RECTAL | Status: DC | PRN

## 2022-11-19 MED ORDER — CHLORHEXIDINE GLUCONATE CLOTH 2 % EX PADS
6.0000 | MEDICATED_PAD | Freq: Every day | CUTANEOUS | Status: DC
  Administered 2022-11-19: 6 via TOPICAL

## 2022-11-19 NOTE — TOC Initial Note (Signed)
Transition of Care [facility]) - Initial/Assessment Note    Patient Details  Name: [name] MRN: [id number] Date of Birth: August 17, 1934  [facility]) CM/SW Contact:    Darleene Cleaver, LCSW Phone Number: 11/19/2022, 6:10 PM  Clinical Narrative:                  Patient is an 88 year old female who is alert and oriented to person only.  Patient has dementia, CSW completed assessment by speaking to patient's daughter [name], [phone].  Per patient's daughter patient has been at [facility] as a LTC resident for about a year and is still being followed by [facility].  Per patient's daughter the plan is to have her return back to SNF.  Per patient's daughter, patient is normally not agitated only when she is in unfamiliar situations.  Palliative has been consult to discuss with patient and daughter goals of care.  Per physician note, palliative will be meeting with daughter tomorrow to discuss treatment plan.  Per physician patient is not a good surgical candidate.  TOC to continue to follow patient's progress throughout discharge planning.  Expected Discharge Plan: Skilled Nursing Facility Barriers to Discharge: Continued Medical Work up   Patient Goals and CMS Choice Patient states their goals for this hospitalization and ongoing recovery are:: Per daughter, patient plans to return back to [facility] where she is a LTC resident. CMS Medicare.gov Compare Post Acute Care list provided to:: Patient Represenative (must comment) (Patient's daughter) Choice offered to / list presented to : Adult Children [place] ownership interest in [facility].provided to:: Adult Children    Expected Discharge Plan and Services In-house Referral: Clinical Social Work   Post Acute Care Choice: Skilled Nursing Facility Living arrangements for the past 2 months: Skilled Nursing Facility                                      Prior Living Arrangements/Services Living  arrangements for the past 2 months: Skilled Nursing Facility Lives with:: Facility Resident Patient language and need for interpreter reviewed:: Yes Do you feel safe going back to the place where you live?: Yes      Need for Family Participation in Patient Care: Yes (Comment) Care giver support system in place?: Yes (comment)   Criminal Activity/Legal Involvement Pertinent to Current Situation/Hospitalization: No - Comment as needed  Activities of Daily Living      Permission Sought/Granted Permission sought to share information with : Case Manager, Magazine features editor, Family Supports Permission granted to share information with : Yes, Release of Information Signed, Yes, Verbal Permission Granted  Share Information with NAME: Gaskill,Shelly Daughter   [phone]  [place], Prevette Son [phone]    Epifania Gore  [phone]  Permission granted to share info w AGENCY: SNF admissions        Emotional Assessment Appearance:: Appears stated age Attitude/Demeanor/Rapport: Combative Affect (typically observed): Other (comment), Restless (Confused) Orientation: : Oriented to Self Alcohol / Substance Use: Not Applicable Psych Involvement: No (comment)  Admission diagnosis:  Intestinal perforation (HCC) [K63.1] Perforated diverticulum [K57.80] Gastrointestinal hemorrhage, unspecified gastrointestinal hemorrhage type [K92.2] Sepsis, due to unspecified organism, unspecified whether acute organ dysfunction present [facility]) [A41.9] Patient Active Problem List   Diagnosis Date Noted   Perforated diverticulum 11/18/2022   Intestinal perforation (HCC) 11/18/2022   Aphasia 04/06/2018   Dysphagia 03/11/2016   Irritable bowel syndrome with  diarrhea 03/11/2016   History of colonic polyps 05/24/2013   Malignant neoplasm of corpus uteri, except isthmus (HCC) 12/22/2010   Pulmonary embolus (HCC)    DVT (deep venous thrombosis) (HCC)    Chronic diarrhea    Insomnia     Multiple thyroid nodules    Anorexia    PCP:  [facility]:   [facility] - [place], [place] - [address] [place] [address] Phone: [phone] Fax: [phone]     Social Determinants of Health (SDOH) Social History: SDOH Screenings   Tobacco Use: Low Risk  (11/18/2022)   SDOH Interventions:     Readmission Risk Interventions    11/19/2022    5:50 PM  Readmission Risk Prevention Plan  Transportation Screening Complete  PCP or Specialist Appt within 5-7 Days Complete  Home Care Screening Complete  Medication Review (RN CM) Referral to Pharmacy

## 2022-11-19 NOTE — Progress Notes (Addendum)
[facility] Progress Note Patient: [name] RUE:[id number] DOB: February 03, 1934 DOA: 11/18/2022  DOS: the patient was seen and examined on 11/19/2022  Brief hospital course: Patient presenting from [facility], PMH of dementia, DVT, cancer, anticoagulation, CKD, protein calorie malnutrition with cachexia and failure to thrive present to the hospital with complaints of abdominal pain and agitation. Was found to have bowel perforation in the large intestine. Concern for stercoral colitis versus diverticulitis. General surgery and GI were consulted. Management is conservative for now. Patient is not a candidate for surgical intervention.  Assessment and Plan: Perforated viscus. Concern for sigmoid diverticulitis. Appreciate surgical evaluation and consultation. Currently patient is not a candidate for surgical intervention. Given that there is some improvement recommended continue current nonoperative conservative measures. For now we will be utilizing IV antibiotics and IV pain medication. Monitor for now. Concern for some ileus given the lack of bowel movement and lack of bowel sound at the time of my evaluation.  Will monitor.  Goals of care conversation. Multiple providers have discussed with family with regards to patient's prognosis. Patient is not a good candidate for surgery. Recommendation is for nonoperative management. Given her ongoing lethargy and agitation she remains with a poor prognosis. Palliative care has been consulted. Daughter [name] have recommended to continue full code for now per conversation with palliative care but will be discussing with her brother.  Likely agreeing for DNR. Given her dire presentation ideally patient would benefit from DNR/DNI and comfort measures for better quality of life as prognosis is poor regardless of the outcome of hospitalization.  History of DVT on chronic anticoagulation. Initial presentation is actually secondary  to BRBPR. Given that as well as the presentation with bowel perforation currently anticoagulation is on hold. Will discuss with family with regards to goals of care tomorrow again at which time if she remains stable can consider initiation of IV heparin.  Advanced dementia. Ongoing agitation with behavioral issues. For now IV Haldol as needed. Patient is on Zyprexa at home, Lexapro, Remeron.  Prognosis is poor.  Parkinson's disease. Associated with dementia. Patient is on carbidopa. Given her n.p.o. status as well as concerns for bowel perforation at present unable to swallow this medication safely. Prognosis is poor while she is not able to receive this medication.  Hypophosphatemia. Replacing. Monitor.  Acute kidney injury. Baseline serum creatinine appears to be around 1.3. On admission serum creatinine was 1.7. Currently improving to 1.3 again with hydration. I have informed the family that there is a chance that we might be able to make the numbers look better but it does not mean that the patient may be appearing better or progressing better.  Normocytic anemia. Baseline hemoglobin around 10. Hemoglobin on admission was around 9.3-10.5. Currently 8.3 likely in the setting of dilution.  Reported BRBPR on admission but no further bleeding so far. Hold anticoagulation and Monitor for now.  Cachexia. Concern for adult failure to thrive. Underweight. Body mass index is 15.68 kg/m.  Likely in the setting of progressive dementia. Prognosis is very poor. Monitor   Subjective: No nausea no vomiting.  Patient is drowsy and sleepy. No BM so far.  Physical Exam: General: in Mild distress, No Rash Cardiovascular: S1 and S2 Present, No Murmur Respiratory: Good respiratory effort, Bilateral Air entry present. No Crackles, No wheezes Abdomen: Bowel Sound present, difficult to assess tenderness Extremities: No edema Neuro: Lethargic, opening of eyes.  Not following any  commands.  Nonverbal.  Does not appear to have any  focal deficit as moving all extremities spontaneously.  Data Reviewed: I have Reviewed nursing notes, Vitals, and Lab results. Since last encounter, pertinent lab results CBC and BMP   . I have ordered test including CBC and BMP  . I have discussed pt's care plan and test results with palliative care, general surgery, PCCM   .   Disposition: Status is: Inpatient Remains inpatient appropriate because: Need further clarity on goals of care as well as IV antibiotic therapy in the meantime.  Place and maintain sequential compression device Start: 11/19/22 0827   Family Communication: Discussed with family at bedside Level of care: Stepdown   Vitals:   11/19/22 1315 11/19/22 1330 11/19/22 1400 11/19/22 1700  BP:  (!) 106/47 120/60 139/76  Pulse: 68 69 71 73  Resp: 15 15 13 19   Temp:      TempSrc:      SpO2: 95% 94% 96% 90%  Weight:      Height:        The patient is critically ill with multiple organ systems failure and requires high complexity decision making for assessment and support, frequent evaluation and titration of therapies. Critical Care Time devoted to patient care services described in this note is 35 minutes  Author: Lynden Oxford, MD 11/19/2022 5:59 PM  Please look on [email or web address] to find out who is on call.

## 2022-11-19 NOTE — Progress Notes (Signed)
PHARMACY NOTE:  ANTIMICROBIAL RENAL DOSAGE ADJUSTMENT  Current antimicrobial regimen includes a mismatch between antimicrobial dosage and estimated renal function.  As per policy approved by the Pharmacy & Therapeutics and Medical Executive Committees, the antimicrobial dosage will be adjusted accordingly.  Current antimicrobial dosage:  Zosyn 2.25 g IV q8h  Indication: IAI  Renal Function:  Estimated Creatinine Clearance: 22.3 mL/min (A) (by C-G formula based on SCr of 1.3 mg/dL (H)). []      On intermittent HD, scheduled: []      On CRRT    Antimicrobial dosage has been changed to:   Zosyn 3.375 g IV over 4 hours q8h  Additional comments:   Thank you for allowing pharmacy to be a part of this patient's care.  Lynden Ang, [facility] 11/19/2022 10:54 AM

## 2022-11-19 NOTE — Hospital Course (Addendum)
Brief hospital course: Patient presenting from [facility], PMH of dementia, DVT, cancer, anticoagulation, CKD, protein calorie malnutrition with cachexia and failure to thrive present to the hospital with complaints of abdominal pain and agitation. Was found to have bowel perforation in the large intestine. Concern for stercoral colitis versus diverticulitis. General surgery and GI were consulted. Management is conservative for now. Per surgery, patient is not a candidate for surgical intervention.  Assessment and Plan: Perforated viscus. Concern for sigmoid diverticulitis. Appreciate surgical evaluation and consultation. Currently patient is not a candidate for surgical intervention. Given that there is some improvement recommended continue current nonoperative conservative measures. For now we will be utilizing IV antibiotics and IV pain medication. Patient had a BM.  No blood in the stool.  Discussed with general surgery.  Will advance to clear liquid diet based on SLP evaluation.  Goals of care conversation. Multiple providers have discussed with family with regards to patient's prognosis. Patient is not a good candidate for surgery. Recommendation is for nonoperative management. Palliative care has been consulted. Daughter [name] have recommended to continue full code for now per my conversation based on 11/15. Unfortunately prognosis is still poor regardless of the outcome of hospitalization given her malnutrition and presentation with bowel perforation.  History of DVT on chronic anticoagulation. Initial presentation is actually secondary to BRBPR. Given that as well as the presentation with bowel perforation currently anticoagulation is on hold. Will continue to hold anticoagulation but if H&H remains labile will initiate IV heparin on 11/15 without a bolus.  Advanced dementia with behavioral issue. Ongoing agitation. Will resume Zyprexa.  Parkinson's  disease. Associated with dementia. Patient is on carbidopa.  Will resume and monitor.  Hypophosphatemia. Replaced.  Acute kidney injury. Baseline serum creatinine appears to be around 1.3. On admission serum creatinine was 1.7. Currently improving to 1.3 again with hydration. I have informed the family that there is a chance that we might be able to make the numbers look better but it does not mean that the patient may be appearing better or progressing better.  Normocytic anemia. Baseline hemoglobin around 10. Hemoglobin on admission was around 9.3-10.5. Currently remaining stable around 8. Reported BRBPR on admission but so far no bleeding reassuringly. Hold anticoagulation and Monitor for now.  Cachexia. Concern for adult failure to thrive. Underweight. Body mass index is 15.68 kg/m.  Likely in the setting of progressive dementia. Prognosis is very poor.  Will consult dietitian. Monitor

## 2022-11-19 NOTE — ED Notes (Signed)
ED TO INPATIENT HANDOFF REPORT  ED Nurse Name and Phone #: Pennelope Bracken EMTP  S Name/Age/Gender [name] 88 y.o. female Room/Bed: WA04/WA04  Code Status   Code Status: Prior  Home/SNF/Other [facility] Patient oriented to: self Is this baseline? Yes   Triage Complete: Triage complete  Chief Complaint Intestinal perforation [facility]) [K63.1]  Triage Note Patient arrived via [facility] from [facility]. Per EMS reports blood in stool yesterday. Patient is oriented x1 which is baseline, but was very agitated this morning which is new per daughter.   Allergies Allergies  Allergen Reactions   Aspridrox [Aspirin Buf(Alhyd-Mghyd-Cacar)] Other (See Comments)    Unknown  Not documented on MAR   Codeine Other (See Comments)    Unknown   Darvocet [Propoxyphene N-Acetaminophen] Other (See Comments)    Unknown  Not documented on MAR   Esomeprazole Magnesium Other (See Comments)    Unknown  Not documented on MAR   Iodine Other (See Comments)    unknown   Morphine And Codeine Other (See Comments)    unknown   Sulfa Antibiotics Other (See Comments)    Unknown  Not documented on MAR   Sulfate Other (See Comments)    Unknown  Not documented on MAR   Tramadol Other (See Comments)    Unknown  Not documented on MAR    Level of Care/Admitting Diagnosis ED Disposition     ED Disposition  Admit   Condition  --   Comment  Hospital Area: [facility]  HOSPITAL [[id number]]  Level of Care: Stepdown [14]  Admit to SDU based on following criteria: Severe physiological/psychological symptoms:  Any diagnosis requiring assessment & intervention at least every 4 hours on an ongoing basis to obtain desired patient outcomes including stability and rehabilitation  May admit patient to [facility] or [facility] if equivalent level of care is available:: Yes  Covid Evaluation: Asymptomatic - no recent exposure (last 10 days) testing not required   Diagnosis: Intestinal perforation [facility]) [[id number]]  Admitting Physician: Lorin Glass [[id number]]  Attending Physician: Lorin Glass [[id number]]  Certification:: I certify this patient will need inpatient services for at least 2 midnights  Expected Medical Readiness: 11/25/2022          B Medical/Surgery History Past Medical History:  Diagnosis Date   Anorexia    Anxiety    Blood transfusion without reported diagnosis    1970's   Chronic diarrhea    CKD (chronic kidney disease)    Clotting disorder (HCC)    Dementia (HCC)    Depression    DVT (deep venous thrombosis) (HCC)    Endometrial ca (HCC)    GERD (gastroesophageal reflux disease)    Hiatal hernia    History of D&C    History of sinus surgery    Hypokalemia    IBS (irritable bowel syndrome)    Insomnia    Insomnia    Multiple thyroid nodules 12/2005   b/l nodules thyroid    Neuropathy    Parkinson's disease (HCC)    Pulmonary embolus (HCC)    Radiation Jan.3-Feb.6,2008   External Beam to pelvis   Radiation Feb.13,20 and 27/2008   Intracavitary brachytherapy   Seizure disorder (HCC)    Stroke (HCC)    R brain Left hemiparesis mild   Past Surgical History:  Procedure Laterality Date   ABDOMINAL HYSTERECTOMY  08/11/05   tah,bso,pelvic lymphadenectomy   ankle fracture repair Left  antroscopy     maxillary   CHOLECYSTECTOMY     COLONOSCOPY  2012   benign colonic polyps   ESOPHAGOGASTRODUODENOSCOPY  2012   neg   IVC Filter  2007   s/p DVT and P.E>   LAPAROSCOPIC ASSISTED RADICAL VAGINAL HYSTERECTOMY W/ NODE BIOPSY  08/11/05   LYMPHADENECTOMY     rotator cuff repair Right      A IV Location/Drains/Wounds Patient Lines/Drains/Airways Status     Active Line/Drains/Airways     Name Placement date Placement time Site Days   Peripheral IV 11/18/22 20 G 1" Anterior;Left;Proximal Forearm 11/18/22  1135  Forearm  1            Intake/Output Last 24 hours No intake or output data in the 24 hours  ending 11/19/22 1546  Labs/Imaging Results for orders placed or performed during the hospital encounter of 11/18/22 (from the past 48 hour(s))  Blood Culture (routine x 2)     Status: None (Preliminary result)   Collection Time: 11/18/22 11:38 AM   Specimen: BLOOD  Result Value Ref Range   Specimen Description      BLOOD LEFT ANTECUBITAL Performed at [facility], [address], [place], [place] [address]    Special Requests      BOTTLES DRAWN AEROBIC AND ANAEROBIC Blood Culture adequate volume Performed at [facility], [address], [place], [place] [address]    Culture      NO GROWTH < 24 HOURS Performed at [facility], [address] [place], [place] [address]    Report Status PENDING   Comprehensive metabolic panel     Status: Abnormal   Collection Time: 11/18/22 11:40 AM  Result Value Ref Range   Sodium 136 135 - 145 mmol/L   Potassium 4.6 3.5 - 5.1 mmol/L    Comment: HEMOLYSIS AT THIS LEVEL MAY AFFECT RESULT   Chloride 103 98 - 111 mmol/L   CO2 25 22 - 32 mmol/L   Glucose, Bld 91 70 - 99 mg/dL    Comment: Glucose reference range applies only to samples taken after fasting for at least 8 hours.   BUN 34 (H) 8 - 23 mg/dL   Creatinine, Ser 9.14 (H) 0.44 - 1.00 mg/dL   Calcium 9.5 8.9 - 78.2 mg/dL   Total Protein 6.6 6.5 - 8.1 g/dL   Albumin 3.0 (L) 3.5 - 5.0 g/dL   AST 25 15 - 41 U/L    Comment: HEMOLYSIS AT THIS LEVEL MAY AFFECT RESULT   ALT 17 0 - 44 U/L    Comment: HEMOLYSIS AT THIS LEVEL MAY AFFECT RESULT   Alkaline Phosphatase 84 38 - 126 U/L   Total Bilirubin 2.2 (H) <1.2 mg/dL    Comment: HEMOLYSIS AT THIS LEVEL MAY AFFECT RESULT   GFR, Estimated 34 (L) >60 mL/min    Comment: (NOTE) Calculated using the CKD-EPI Creatinine Equation (2021)    Anion gap 8 5 - 15    Comment: Performed at [facility], [address] [place], [place] [address]  CBC with Differential     Status: Abnormal    Collection Time: 11/18/22 11:40 AM  Result Value Ref Range   WBC 17.8 (H) 4.0 - 10.5 K/uL   RBC 3.01 (L) 3.87 - 5.11 MIL/uL   Hemoglobin 9.3 (L) 12.0 - 15.0 g/dL   HCT 30.8 (L) 65.7 - 84.6 %   MCV 98.7 80.0 - 100.0 fL   MCH 30.9 26.0 -  34.0 pg   MCHC 31.3 30.0 - 36.0 g/dL   RDW 81.1 91.4 - 78.2 %   Platelets 196 150 - 400 K/uL   nRBC 0.0 0.0 - 0.2 %   Neutrophils Relative % 89 %   Neutro Abs 15.8 (H) 1.7 - 7.7 K/uL   Lymphocytes Relative 5 %   Lymphs Abs 0.9 0.7 - 4.0 K/uL   Monocytes Relative 5 %   Monocytes Absolute 1.0 0.1 - 1.0 K/uL   Eosinophils Relative 0 %   Eosinophils Absolute 0.0 0.0 - 0.5 K/uL   Basophils Relative 0 %   Basophils Absolute 0.0 0.0 - 0.1 K/uL   Immature Granulocytes 1 %   Abs Immature Granulocytes 0.15 (H) 0.00 - 0.07 K/uL    Comment: Performed at [facility], [address], [place], [place] [address]  Protime-INR     Status: Abnormal   Collection Time: 11/18/22 11:40 AM  Result Value Ref Range   Prothrombin Time 18.7 (H) 11.4 - 15.2 seconds   INR 1.5 (H) 0.8 - 1.2    Comment: (NOTE) INR goal varies based on device and disease states. Performed at [facility], [address] [place], [place] [address]   APTT     Status: None   Collection Time: 11/18/22 11:40 AM  Result Value Ref Range   aPTT 26 24 - 36 seconds    Comment: Performed at [facility], [address] [place], [place] [address]  I-Stat Lactic Acid, ED     Status: None   Collection Time: 11/18/22 11:50 AM  Result Value Ref Range   Lactic Acid, Venous 1.2 0.5 - 1.9 mmol/L  Blood Culture (routine x 2)     Status: None (Preliminary result)   Collection Time: 11/18/22 11:50 AM   Specimen: BLOOD  Result Value Ref Range   Specimen Description      BLOOD RIGHT ANTECUBITAL Performed at [facility], [address] [place], [place] [address]    Special Requests      BOTTLES DRAWN AEROBIC AND ANAEROBIC  Blood Culture adequate volume Performed at [facility], [address], [place], [place] [address]    Culture      NO GROWTH < 24 HOURS Performed at [facility], [address] [place], [place] [address]    Report Status PENDING   Type and screen [facility]     Status: None   Collection Time: 11/18/22 12:00 PM  Result Value Ref Range   ABO/RH(D) B POS    Antibody Screen NEG    Sample Expiration      11/21/2022,2359 Performed at [facility], [address] [place], [place] [address]   Resp panel by RT-PCR (RSV, Flu A&B, Covid) Anterior Nasal Swab     Status: None   Collection Time: 11/18/22 12:02 PM   Specimen: Anterior Nasal Swab  Result Value Ref Range   SARS Coronavirus 2 by RT PCR NEGATIVE NEGATIVE    Comment: (NOTE) SARS-CoV-2 target nucleic acids are NOT DETECTED.  The SARS-CoV-2 RNA is generally detectable in upper respiratory specimens during the acute phase of infection. The lowest concentration of SARS-CoV-2 viral copies this assay can detect is 138 copies/mL. A negative result does not preclude SARS-Cov-2 infection and should not be used as the sole basis for treatment or other patient management decisions. A negative result may occur with  improper specimen collection/handling, submission of specimen other  than nasopharyngeal swab, presence of viral mutation(s) within the areas targeted by this assay, and inadequate number of viral copies(<138 copies/mL). A negative result must be combined with clinical observations, patient history, and epidemiological information. The expected result is Negative.  Fact Sheet for Patients:  BloggerCourse.com  Fact Sheet for Healthcare Providers:  SeriousBroker.it  This test is no t yet approved or cleared by the [place] FDA and  has been authorized for detection and/or diagnosis of SARS-CoV-2 by FDA  under an Emergency Use Authorization (EUA). This EUA will remain  in effect (meaning this test can be used) for the duration of the COVID-19 declaration under Section 564(b)(1) of the Act, 21 U.S.C.section 360bbb-3(b)(1), unless the authorization is terminated  or revoked sooner.       Influenza A by PCR NEGATIVE NEGATIVE   Influenza B by PCR NEGATIVE NEGATIVE    Comment: (NOTE) The Xpert Xpress SARS-CoV-2/FLU/RSV plus assay is intended as an aid in the diagnosis of influenza from Nasopharyngeal swab specimens and should not be used as a sole basis for treatment. Nasal washings and aspirates are unacceptable for Xpert Xpress SARS-CoV-2/FLU/RSV testing.  Fact Sheet for Patients: BloggerCourse.com  Fact Sheet for Healthcare Providers: SeriousBroker.it  This test is not yet approved or cleared by the [place] FDA and has been authorized for detection and/or diagnosis of SARS-CoV-2 by FDA under an Emergency Use Authorization (EUA). This EUA will remain in effect (meaning this test can be used) for the duration of the COVID-19 declaration under Section 564(b)(1) of the Act, 21 U.S.C. section 360bbb-3(b)(1), unless the authorization is terminated or revoked.     Resp Syncytial Virus by PCR NEGATIVE NEGATIVE    Comment: (NOTE) Fact Sheet for Patients: BloggerCourse.com  Fact Sheet for Healthcare Providers: SeriousBroker.it  This test is not yet approved or cleared by the [place] FDA and has been authorized for detection and/or diagnosis of SARS-CoV-2 by FDA under an Emergency Use Authorization (EUA). This EUA will remain in effect (meaning this test can be used) for the duration of the COVID-19 declaration under Section 564(b)(1) of the Act, 21 U.S.C. section 360bbb-3(b)(1), unless the authorization is terminated or revoked.  Performed at [facility], [address], [place], [place] [address]   I-stat chem 8, ED (not at [facility] or [facility])     Status: Abnormal   Collection Time: 11/18/22 12:05 PM  Result Value Ref Range   Sodium 139 135 - 145 mmol/L   Potassium 4.6 3.5 - 5.1 mmol/L   Chloride 104 98 - 111 mmol/L   BUN 42 (H) 8 - 23 mg/dL   Creatinine, Ser 4.78 (H) 0.44 - 1.00 mg/dL   Glucose, Bld 93 70 - 99 mg/dL    Comment: Glucose reference range applies only to samples taken after fasting for at least 8 hours.   Calcium, Ion 1.20 1.15 - 1.40 mmol/L   TCO2 28 22 - 32 mmol/L   Hemoglobin 10.5 (L) 12.0 - 15.0 g/dL   HCT 29.5 (L) 62.1 - 30.8 %  I-Stat Lactic Acid, ED     Status: None   Collection Time: 11/18/22  2:13 PM  Result Value Ref Range   Lactic Acid, Venous 1.0 0.5 - 1.9 mmol/L  Urinalysis, w/ Reflex to Culture (Infection Suspected) -Urine, Catheterized     Status: Abnormal   Collection Time: 11/18/22  2:15 PM  Result Value Ref Range   Specimen Source URINE, CATHETERIZED    Color, Urine YELLOW YELLOW  APPearance HAZY (A) CLEAR   Specific Gravity, Urine 1.015 1.005 - 1.030   pH 6.0 5.0 - 8.0   Glucose, UA NEGATIVE NEGATIVE mg/dL   Hgb urine dipstick SMALL (A) NEGATIVE   Bilirubin Urine NEGATIVE NEGATIVE   Ketones, ur 5 (A) NEGATIVE mg/dL   Protein, ur NEGATIVE NEGATIVE mg/dL   Nitrite NEGATIVE NEGATIVE   Leukocytes,Ua NEGATIVE NEGATIVE   RBC / HPF 0-5 0 - 5 RBC/hpf   WBC, UA 6-10 0 - 5 WBC/hpf    Comment:        Reflex urine culture not performed if WBC <=10, OR if Squamous epithelial cells >5. If Squamous epithelial cells >5 suggest recollection.    Bacteria, UA RARE (A) NONE SEEN   Squamous Epithelial / HPF 0-5 0 - 5 /HPF   Amorphous Crystal PRESENT     Comment: Performed at [facility], [address] [place], [place] [address]  POC occult blood, ED     Status: Abnormal   Collection Time: 11/18/22  2:21 PM  Result Value Ref Range   Fecal Occult Bld POSITIVE (A) NEGATIVE   I-Stat CG4 Lactic Acid     Status: Abnormal   Collection Time: 11/18/22  6:14 PM  Result Value Ref Range   Lactic Acid, Venous 2.4 (HH) 0.5 - 1.9 mmol/L   Comment NOTIFIED PHYSICIAN   I-Stat CG4 Lactic Acid     Status: None   Collection Time: 11/18/22  8:38 PM  Result Value Ref Range   Lactic Acid, Venous 0.7 0.5 - 1.9 mmol/L  CBC     Status: Abnormal   Collection Time: 11/19/22  4:16 AM  Result Value Ref Range   WBC 13.3 (H) 4.0 - 10.5 K/uL   RBC 2.72 (L) 3.87 - 5.11 MIL/uL   Hemoglobin 8.3 (L) 12.0 - 15.0 g/dL   HCT 84.6 (L) 96.2 - 95.2 %   MCV 98.2 80.0 - 100.0 fL   MCH 30.5 26.0 - 34.0 pg   MCHC 31.1 30.0 - 36.0 g/dL   RDW 84.1 32.4 - 40.1 %   Platelets 181 150 - 400 K/uL   nRBC 0.0 0.0 - 0.2 %    Comment: Performed at [facility], [address] [place], [place] [address]  Basic metabolic panel     Status: Abnormal   Collection Time: 11/19/22  4:16 AM  Result Value Ref Range   Sodium 139 135 - 145 mmol/L   Potassium 3.9 3.5 - 5.1 mmol/L   Chloride 106 98 - 111 mmol/L   CO2 25 22 - 32 mmol/L   Glucose, Bld 86 70 - 99 mg/dL    Comment: Glucose reference range applies only to samples taken after fasting for at least 8 hours.   BUN 29 (H) 8 - 23 mg/dL   Creatinine, Ser 3.66 (H) 0.44 - 1.00 mg/dL   Calcium 9.2 8.9 - 44.0 mg/dL   GFR, Estimated 40 (L) >60 mL/min    Comment: (NOTE) Calculated using the CKD-EPI Creatinine Equation (2021)    Anion gap 8 5 - 15    Comment: Performed at [facility], [address] [place], [place] [address]  Magnesium     Status: None   Collection Time: 11/19/22  4:16 AM  Result Value Ref Range   Magnesium 2.2 1.7 - 2.4 mg/dL    Comment: Performed at [facility], [address] [place], [place] [address]  Phosphorus     Status: Abnormal  Collection Time: 11/19/22  4:16 AM  Result Value Ref Range   Phosphorus 1.9 (L) 2.5 - 4.6 mg/dL    Comment: Performed at [facility], [address] [place], [place] [address]   DG Chest Port 1 View  Result Date: 11/18/2022 CLINICAL DATA:  Altered mental status. EXAM: PORTABLE CHEST 1 VIEW COMPARISON:  Chest radiograph dated October 10, 2022. FINDINGS: Patient is rotated to the right. The heart size and mediastinal contours are within normal limits. No focal consolidation, pleural effusion, or pneumothorax. No acute osseous abnormality. IMPRESSION: No acute cardiopulmonary findings. Electronically Signed   By: Hart Robinsons M.D.   On: 11/18/2022 15:58   CT ABDOMEN PELVIS WO CONTRAST  Result Date: 11/18/2022 CLINICAL DATA:  Left lower quadrant abdominal pain. EXAM: CT ABDOMEN AND PELVIS WITHOUT CONTRAST TECHNIQUE: Multidetector CT imaging of the abdomen and pelvis was performed following the standard protocol without IV contrast. RADIATION DOSE REDUCTION: This exam was performed according to the departmental dose-optimization program which includes automated exposure control, adjustment of the mA and/or kV according to patient size and/or use of iterative reconstruction technique. COMPARISON:  April 20, 2021. FINDINGS: Lower chest: Minimal bibasilar subsegmental atelectasis is noted. Hepatobiliary: Status post cholecystectomy. No biliary dilatation. Multiple calcified granulomas are noted in the patent parenchyma which are unchanged. Pancreas: Unremarkable. No pancreatic ductal dilatation or surrounding inflammatory changes. Spleen: Normal in size without focal abnormality. Adrenals/Urinary Tract: Evaluation is limited due to extensive amount of air seen in the retroperitoneum superiorly which extends into the mediastinum. No hydronephrosis or renal obstruction is noted. Urinary bladder is unremarkable. Stomach/Bowel: Stomach is unremarkable. Mildly dilated small bowel loops are noted in the pelvis of uncertain etiology. Stool is noted in the sigmoid colon. There is moderate wall thickening of the sigmoid colon and  a large amount of free air is noted adjacent to it concerning for perforation. This may account for the free air seen in the more superior portion of the retroperitoneum. Vascular/Lymphatic: Aortic atherosclerosis. No enlarged abdominal or pelvic lymph nodes. IVC filter is noted in infrarenal position. Reproductive: Status post hysterectomy. No adnexal masses. Other: No hernia is noted. Musculoskeletal: No acute or significant osseous findings. IMPRESSION: Moderate wall thickening of the sigmoid colon is noted with a large amount of free air adjacent to it concerning for perforation, potentially due to diverticulitis or colitis. Extensive amount of emphysema is noted in the retroperitoneal region most likely secondary to this, which extends into the visualized portion of the mediastinum. Mildly dilated small bowel loops are noted most likely representing ileus secondary to inflammation. Critical Value/emergent results were called by telephone at the time of interpretation on 11/18/2022 at 3:49 pm to provider [facility] , who verbally acknowledged these results. Electronically Signed   By: Lupita Raider M.D.   On: 11/18/2022 15:49   CT Head Wo Contrast  Result Date: 11/18/2022 CLINICAL DATA:  Head trauma.  Altered mental status. EXAM: CT HEAD WITHOUT CONTRAST TECHNIQUE: Contiguous axial images were obtained from the base of the skull through the vertex without intravenous contrast. RADIATION DOSE REDUCTION: This exam was performed according to the departmental dose-optimization program which includes automated exposure control, adjustment of the mA and/or kV according to patient size and/or use of iterative reconstruction technique. COMPARISON:  10/11/2022 FINDINGS: Brain: Chronic brain atrophy. No sign of acute infarction, mass lesion, hemorrhage, hydrocephalus or extra-axial collection. Vascular: There is atherosclerotic calcification of the major vessels at the base of the brain. Skull: Negative  Sinuses/Orbits:  Clear/normal Other: None IMPRESSION: No acute or traumatic finding. Chronic brain atrophy. Electronically Signed   By: Paulina Fusi M.D.   On: 11/18/2022 12:32    Pending Labs Unresulted Labs (From admission, onward)     Start     Ordered   11/19/22 0500  CBC  Daily,   R      11/18/22 1712   11/19/22 0500  Basic metabolic panel  Daily,   R      11/18/22 1712   11/19/22 0500  Magnesium  Daily,   R      11/18/22 1712   11/19/22 0500  Phosphorus  Daily,   R      11/18/22 1712            Vitals/Pain Today's Vitals   11/19/22 1300 11/19/22 1315 11/19/22 1330 11/19/22 1400  BP: (!) 126/55  (!) 106/47 120/60  Pulse: 71 68 69 71  Resp: 15 15 15 13   Temp:      TempSrc:      SpO2: 96% 95% 94% 96%  Weight:      Height:      PainSc:        Isolation Precautions No active isolations  Medications Medications  sodium chloride flush (NS) 0.9 % injection 10 mL (10 mLs Intravenous Given 11/19/22 1026)  haloperidol lactate (HALDOL) injection 2 mg (2 mg Intravenous Given 11/19/22 0220)  lactated ringers infusion (0 mLs Intravenous Stopped 11/19/22 0618)  pantoprazole (PROTONIX) injection 40 mg (40 mg Intravenous Given 11/19/22 1025)  piperacillin-tazobactam (ZOSYN) IVPB 3.375 g (has no administration in time range)  lip balm (CARMEX) ointment (has no administration in time range)  lactated ringers bolus 1,000 mL (0 mLs Intravenous Stopped 11/18/22 1400)  cefTRIAXone (ROCEPHIN) 2 g in sodium chloride 0.9 % 100 mL IVPB (0 g Intravenous Stopped 11/18/22 1400)  metroNIDAZOLE (FLAGYL) IVPB 500 mg (0 mg Intravenous Stopped 11/18/22 1400)  LORazepam (ATIVAN) injection 0.5 mg (0.5 mg Intravenous Given 11/18/22 1545)  sodium chloride 0.9 % bolus 1,000 mL (0 mLs Intravenous Stopped 11/18/22 2024)  piperacillin-tazobactam (ZOSYN) IVPB 3.375 g (0 g Intravenous Stopped 11/18/22 2024)  haloperidol lactate (HALDOL) injection 2 mg (2 mg Intramuscular Given 11/18/22 1808)     Mobility non-ambulatory     Focused Assessments See Chart   R Recommendations: See Admitting Provider Note  Report given to: Onalee Hua B. RN

## 2022-11-19 NOTE — Consult Note (Signed)
Consultation Note Date: 11/19/2022   Patient Name: [name]  DOB: Jun 02, 1934  MRN: [id number]  Age / Sex: 88 y.o., female  PCP: [facility] Referring Physician: Rolly Salter, MD  Reason for Consultation: Establishing goals of care  HPI/Patient Profile: 88 y.o. female  with past medical history of dementia, severe protein calorie malnutrition, VTE on Eliquis and s/p IVC filter, falls, endometrial cancer admitted on 11/18/2022 from nursing facility with blood in stool and agitation with chance in mental status. CT abd revealed abd free air and perforated diverticulum.   Clinical Assessment and Goals of Care: Consult received and chart review completed. I met today at [name]'s bedside and caregiver, [name], is at bedside. [name] and I discuss [name]'s baseline. She has been with her twice weekly for the past ~1 year. She tells me that [name] has baseline confusion and often talkative but rambling. She has incontinence most of the time. She is mostly wheelchair bound secondary to history of falls. She reports that intake varies and she has good days and bad days. [name] sleeps during most of my visit.   Dr. Allena Katz and Dr. Andrey Campanile both come to bedside towards the end of my visit. I called daughter, [name]. [name] and I review progressing dementia and frailty. We discussed perforated bowel. Very poor candidate for surgery - discussed with [name] that her mother is a poor candidate for surgery even if she declined and worsened. I worry that she would not recover from surgery due to her dementia as she would likely struggle with intake - she is already very frail. [name] understands but admits that she still struggles with not pursuing surgery if she worsened - we agreed to continue to discuss. We discussed plans for conservative treatment with bowel rest, IVF, and  antibiotics. We reviewed that no plans for artificial nutrition and we will see how she does over the next 1-2 days. We reviewed no plans for flex sig now that we see perforation on CT abd. I reassured her that her mother seems comfortable and did not exhibit tenderness to abd exam which is reassuring. She was resting comfortably during my visit.   We further discussed code status in the setting of advanced dementia and now bowel perforation. [name] initially shares that her mother is full code based on her stated wishes but in further conversation she last expressed desire for full code in 2015. We reviewed that her health and QOL has changed drastically since 2015 and discussed the risks vs benefits of resuscitation. [name] admits that DNR would be appropriate but wishes to discuss with her brother when he comes in person tomorrow. We agree to continue conservative interventions and discuss code status and progress tomorrow afternoon when she and her brother plan to be to beside.   I did clarify [name]'s desire for caregiver/companion [name] to have medical information. [name] agrees that we can answer [name]'s questions but details, discussions, and decisions need to go through [name] as she  is HCPOA. Most information to caregiver should come from [name].   All questions/concerns addressed. Emotional support provided.   Primary Decision Maker HCPOA daughter [name]    SUMMARY OF RECOMMENDATIONS   - Family meeting tomorrow afternoon to discuss progress, GOC, code status.   Code Status/Advance Care Planning: Full code - family discussing wishes for potential DNR   Symptom Management:  Per attending, surgery.   Prognosis:  Overall prognosis poor with advanced dementia.   Discharge Planning: To Be Determined      Primary Diagnoses: Present on Admission:  Perforated diverticulum   I have reviewed the medical record, interviewed the patient and family, and examined the patient. The  following aspects are pertinent.  Past Medical History:  Diagnosis Date   Anorexia    Anxiety    Blood transfusion without reported diagnosis    1970's   Chronic diarrhea    CKD (chronic kidney disease)    Clotting disorder (HCC)    Dementia (HCC)    Depression    DVT (deep venous thrombosis) (HCC)    Endometrial ca (HCC)    GERD (gastroesophageal reflux disease)    Hiatal hernia    History of D&C    History of sinus surgery    Hypokalemia    IBS (irritable bowel syndrome)    Insomnia    Insomnia    Multiple thyroid nodules 12/2005   b/l nodules thyroid    Neuropathy    Parkinson's disease (HCC)    Pulmonary embolus (HCC)    Radiation Jan.3-Feb.6,2008   External Beam to pelvis   Radiation Feb.13,20 and 27/2008   Intracavitary brachytherapy   Seizure disorder (HCC)    Stroke (HCC)    R brain Left hemiparesis mild   Social History   Socioeconomic History   Marital status: Married    Spouse name: Not on file   Number of children: 5   Years of education: Not on file   Highest education level: Not on file  Occupational History   Occupation: RETIRED, Runner, broadcasting/film/video Asst/Librarian    Employer: RETIRED  Tobacco Use   Smoking status: Never   Smokeless tobacco: Never  Substance and Sexual Activity   Alcohol use: No   Drug use: No   Sexual activity: Not on file  Other Topics Concern   Not on file  Social History Narrative   Not on file   Social Determinants of Health   Financial Resource Strain: Not on file  Food Insecurity: Not on file  Transportation Needs: Not on file  Physical Activity: Not on file  Stress: Not on file  Social Connections: Not on file   Family History  Problem Relation Age of Onset   Stomach cancer Sister    Brain cancer Brother    Lung cancer Brother    Breast cancer Sister    Colon cancer Neg Hx    Scheduled Meds:  pantoprazole (PROTONIX) IV  40 mg Intravenous Q12H   sodium chloride flush  10 mL Intravenous Q12H   Continuous  Infusions:  lactated ringers Stopped (11/19/22 0618)   piperacillin-tazobactam (ZOSYN)  IV     PRN Meds:.haloperidol lactate Allergies  Allergen Reactions   Aspridrox [Aspirin Buf(Alhyd-Mghyd-Cacar)] Other (See Comments)    Unknown  Not documented on MAR   Codeine Other (See Comments)    Unknown   Darvocet [Propoxyphene N-Acetaminophen] Other (See Comments)    Unknown  Not documented on MAR   Esomeprazole Magnesium Other (See Comments)    Unknown  Not  documented on MAR   Iodine Other (See Comments)    unknown   Morphine And Codeine Other (See Comments)    unknown   Sulfa Antibiotics Other (See Comments)    Unknown  Not documented on MAR   Sulfate Other (See Comments)    Unknown  Not documented on MAR   Tramadol Other (See Comments)    Unknown  Not documented on MAR   Review of Systems  Unable to perform ROS: Dementia    Physical Exam Vitals and nursing note reviewed.  Constitutional:      General: She is sleeping. She is not in acute distress.    Appearance: She is cachectic. She is ill-appearing.  Cardiovascular:     Rate and Rhythm: Normal rate.  Pulmonary:     Effort: No tachypnea, accessory muscle usage or respiratory distress.  Abdominal:     General: Abdomen is flat.     Palpations: Abdomen is soft.     Tenderness: There is no abdominal tenderness.  Neurological:     Mental Status: She is disoriented and confused.     Vital Signs: BP 120/70   Pulse 78   Temp (!) 97.1 F (36.2 C) (Axillary)   Resp 13   Ht 5\' 7"  (1.702 m)   Wt 45.4 kg   SpO2 95%   BMI 15.68 kg/m  Pain Scale: 0-10   Pain Score: 0-No pain   SpO2: SpO2: 95 % O2 Device:SpO2: 95 % O2 Flow Rate: .   IO: Intake/output summary: No intake or output data in the 24 hours ending 11/19/22 1254  LBM:   Baseline Weight: Weight: 45.4 kg Most recent weight: Weight: 45.4 kg     Palliative Assessment/Data:    Time Total: 75 min  Greater than 50%  of this time was spent  counseling and coordinating care related to the above assessment and plan.  Signed by: Yong Channel, NP Palliative Medicine Team Pager # [phone] (M-F 8a-5p) Team Phone # [phone] (Nights/Weekends)

## 2022-11-19 NOTE — Progress Notes (Signed)
Progress Note     Subjective: Pt sleeping this AM, no family at bedside. Got haldol for agitation around 2 AM.  HD stable and afebrile this AM.   Objective: Vital signs in last 24 hours: Temp:  [97.6 F (36.4 C)-100.7 F (38.2 C)] 98 F (36.7 C) (11/14 0806) Pulse Rate:  [54-137] 72 (11/14 0745) Resp:  [12-24] 24 (11/14 0745) BP: (82-138)/(52-105) 134/64 (11/14 0730) SpO2:  [77 %-100 %] 94 % (11/14 0745) Weight:  [45.4 kg] 45.4 kg (11/13 1217)    Intake/Output from previous day: 11/13 0701 - 11/14 0700 In: 10 [I.V.:10] Out: -  Intake/Output this shift: No intake/output data recorded.  PE: General: sleeping, WD, cachectic female who is laying in bed in NAD, mittens and waist restraint present Heart: regular, rate, and rhythm. Palpable pedal pulses bilaterally Lungs: CTAB, no wheezes, rhonchi, or rales noted.  Respiratory effort nonlabored Abd: soft, ND, +BS, no guarding or grimace on palpation of abdomen   Lab Results:  Recent Labs    11/18/22 1140 11/18/22 1205 11/19/22 0416  WBC 17.8*  --  13.3*  HGB 9.3* 10.5* 8.3*  HCT 29.7* 31.0* 26.7*  PLT 196  --  181   BMET Recent Labs    11/18/22 1140 11/18/22 1205 11/19/22 0416  NA 136 139 139  K 4.6 4.6 3.9  CL 103 104 106  CO2 25  --  25  GLUCOSE 91 93 86  BUN 34* 42* 29*  CREATININE 1.49* 1.70* 1.30*  CALCIUM 9.5  --  9.2   PT/INR Recent Labs    11/18/22 1140  LABPROT 18.7*  INR 1.5*   CMP     Component Value Date/Time   NA 139 11/19/2022 0416   K 3.9 11/19/2022 0416   CL 106 11/19/2022 0416   CO2 25 11/19/2022 0416   GLUCOSE 86 11/19/2022 0416   BUN 29 (H) 11/19/2022 0416   CREATININE 1.30 (H) 11/19/2022 0416   CALCIUM 9.2 11/19/2022 0416   PROT 6.6 11/18/2022 1140   ALBUMIN 3.0 (L) 11/18/2022 1140   AST 25 11/18/2022 1140   ALT 17 11/18/2022 1140   ALKPHOS 84 11/18/2022 1140   BILITOT 2.2 (H) 11/18/2022 1140   GFRNONAA 40 (L) 11/19/2022 0416   GFRAA >60 04/07/2018 0538   Lipase      Component Value Date/Time   LIPASE 29 04/20/2021 1215       Studies/Results: DG Chest Port 1 View  Result Date: 11/18/2022 CLINICAL DATA:  Altered mental status. EXAM: PORTABLE CHEST 1 VIEW COMPARISON:  Chest radiograph dated October 10, 2022. FINDINGS: Patient is rotated to the right. The heart size and mediastinal contours are within normal limits. No focal consolidation, pleural effusion, or pneumothorax. No acute osseous abnormality. IMPRESSION: No acute cardiopulmonary findings. Electronically Signed   By: Hart Robinsons M.D.   On: 11/18/2022 15:58   CT ABDOMEN PELVIS WO CONTRAST  Result Date: 11/18/2022 CLINICAL DATA:  Left lower quadrant abdominal pain. EXAM: CT ABDOMEN AND PELVIS WITHOUT CONTRAST TECHNIQUE: Multidetector CT imaging of the abdomen and pelvis was performed following the standard protocol without IV contrast. RADIATION DOSE REDUCTION: This exam was performed according to the departmental dose-optimization program which includes automated exposure control, adjustment of the mA and/or kV according to patient size and/or use of iterative reconstruction technique. COMPARISON:  April 20, 2021. FINDINGS: Lower chest: Minimal bibasilar subsegmental atelectasis is noted. Hepatobiliary: Status post cholecystectomy. No biliary dilatation. Multiple calcified granulomas are noted in the patent parenchyma which are  unchanged. Pancreas: Unremarkable. No pancreatic ductal dilatation or surrounding inflammatory changes. Spleen: Normal in size without focal abnormality. Adrenals/Urinary Tract: Evaluation is limited due to extensive amount of air seen in the retroperitoneum superiorly which extends into the mediastinum. No hydronephrosis or renal obstruction is noted. Urinary bladder is unremarkable. Stomach/Bowel: Stomach is unremarkable. Mildly dilated small bowel loops are noted in the pelvis of uncertain etiology. Stool is noted in the sigmoid colon. There is moderate wall thickening  of the sigmoid colon and a large amount of free air is noted adjacent to it concerning for perforation. This may account for the free air seen in the more superior portion of the retroperitoneum. Vascular/Lymphatic: Aortic atherosclerosis. No enlarged abdominal or pelvic lymph nodes. IVC filter is noted in infrarenal position. Reproductive: Status post hysterectomy. No adnexal masses. Other: No hernia is noted. Musculoskeletal: No acute or significant osseous findings. IMPRESSION: Moderate wall thickening of the sigmoid colon is noted with a large amount of free air adjacent to it concerning for perforation, potentially due to diverticulitis or colitis. Extensive amount of emphysema is noted in the retroperitoneal region most likely secondary to this, which extends into the visualized portion of the mediastinum. Mildly dilated small bowel loops are noted most likely representing ileus secondary to inflammation. Critical Value/emergent results were called by telephone at the time of interpretation on 11/18/2022 at 3:49 pm to provider [facility] , who verbally acknowledged these results. Electronically Signed   By: Lupita Raider M.D.   On: 11/18/2022 15:49   CT Head Wo Contrast  Result Date: 11/18/2022 CLINICAL DATA:  Head trauma.  Altered mental status. EXAM: CT HEAD WITHOUT CONTRAST TECHNIQUE: Contiguous axial images were obtained from the base of the skull through the vertex without intravenous contrast. RADIATION DOSE REDUCTION: This exam was performed according to the departmental dose-optimization program which includes automated exposure control, adjustment of the mA and/or kV according to patient size and/or use of iterative reconstruction technique. COMPARISON:  10/11/2022 FINDINGS: Brain: Chronic brain atrophy. No sign of acute infarction, mass lesion, hemorrhage, hydrocephalus or extra-axial collection. Vascular: There is atherosclerotic calcification of the major vessels at the base of the  brain. Skull: Negative Sinuses/Orbits: Clear/normal Other: None IMPRESSION: No acute or traumatic finding. Chronic brain atrophy. Electronically Signed   By: Paulina Fusi M.D.   On: 11/18/2022 12:32    Anti-infectives: Anti-infectives (From admission, onward)    Start     Dose/Rate Route Frequency Ordered Stop   11/19/22 0200  piperacillin-tazobactam (ZOSYN) IVPB 3.375 g  Status:  Discontinued        3.375 g 12.5 mL/hr over 240 Minutes Intravenous Every 8 hours 11/18/22 1717 11/18/22 1804   11/19/22 0200  piperacillin-tazobactam (ZOSYN) IVPB 2.25 g        2.25 g 100 mL/hr over 30 Minutes Intravenous Every 8 hours 11/18/22 1804     11/18/22 1730  piperacillin-tazobactam (ZOSYN) IVPB 3.375 g        3.375 g 100 mL/hr over 30 Minutes Intravenous  Once 11/18/22 1717 11/18/22 2024   11/18/22 1130  cefTRIAXone (ROCEPHIN) 2 g in sodium chloride 0.9 % 100 mL IVPB        2 g 200 mL/hr over 30 Minutes Intravenous  Once 11/18/22 1116 11/18/22 1400   11/18/22 1130  metroNIDAZOLE (FLAGYL) IVPB 500 mg        500 mg 100 mL/hr over 60 Minutes Intravenous  Once 11/18/22 1116 11/18/22 1400        Assessment/Plan  Pneumoperitoneum,  likely from sigmoid perforation  - unclear etiology - infection/inflammation v hx of radiation v malignancy - lactate cleared, Cr improving, WBC 13 from 17 - no plans for flex-sig in setting of perforation - hemodynamically stable this AM with fairly benign abdominal exam  - continue IV abx and bowel rest  - per discussion with family overnight, patient's daughter thought she would want aggressive care but was going to have further discussion with other family members - Would not recommend surgical intervention urgent/emergently this AM but would recommend palliative care be engaged to help with GOC discussions - I am not sure that partial colectomy and colostomy in a malnourished patient with dementia and frequent falls would improve her quality of life and she would be  very high risk for perioperative complication and/or mortality   FEN: NPO, IVF VTE: IVC filter  ID: Zosyn 11/13>>  - per [facility] -  Parkinson's diseasea with Dementia - oriented to self at baseline per chart Hx of DVT/PE - s/p IVCF Hx of endometrial CA s/p radiation  CKD Hx of CVA Seizure disorder   LOS: 1 day   I reviewed Consultant GI notes, hospitalist notes, last 24 h vitals and pain scores, last 48 h intake and output, last 24 h labs and trends, and last 24 h imaging results.   Juliet Rude, [facility] Surgery 11/19/2022, 8:43 AM Please see Amion for pager number during day hours 7:00am-4:30pm

## 2022-11-20 ENCOUNTER — Other Ambulatory Visit: Payer: Self-pay

## 2022-11-20 DIAGNOSIS — K5780 Diverticulitis of intestine, part unspecified, with perforation and abscess without bleeding: Secondary | ICD-10-CM | POA: Diagnosis not present

## 2022-11-20 LAB — CBC
HCT: 26.9 % — ABNORMAL LOW (ref 36.0–46.0)
Hemoglobin: 8.6 g/dL — ABNORMAL LOW (ref 12.0–15.0)
MCH: 30.9 pg (ref 26.0–34.0)
MCHC: 32 g/dL (ref 30.0–36.0)
MCV: 96.8 fL (ref 80.0–100.0)
Platelets: 215 10*3/uL (ref 150–400)
RBC: 2.78 MIL/uL — ABNORMAL LOW (ref 3.87–5.11)
RDW: 13.4 % (ref 11.5–15.5)
WBC: 11.9 10*3/uL — ABNORMAL HIGH (ref 4.0–10.5)
nRBC: 0 % (ref 0.0–0.2)

## 2022-11-20 LAB — BASIC METABOLIC PANEL
Anion gap: 9 (ref 5–15)
BUN: 25 mg/dL — ABNORMAL HIGH (ref 8–23)
CO2: 23 mmol/L (ref 22–32)
Calcium: 9.2 mg/dL (ref 8.9–10.3)
Chloride: 107 mmol/L (ref 98–111)
Creatinine, Ser: 1.31 mg/dL — ABNORMAL HIGH (ref 0.44–1.00)
GFR, Estimated: 40 mL/min — ABNORMAL LOW (ref >60)
Glucose, Bld: 90 mg/dL (ref 70–99)
Potassium: 3.5 mmol/L (ref 3.5–5.1)
Sodium: 139 mmol/L (ref 135–145)

## 2022-11-20 LAB — PHOSPHORUS: Phosphorus: 2.6 mg/dL (ref 2.5–4.6)

## 2022-11-20 LAB — MAGNESIUM: Magnesium: 2 mg/dL (ref 1.7–2.4)

## 2022-11-20 MED ORDER — MIRTAZAPINE 15 MG PO TABS
15.0000 mg | ORAL_TABLET | Freq: Every day | ORAL | Status: DC
  Administered 2022-11-20: 15 mg via ORAL
  Filled 2022-11-20: qty 1

## 2022-11-20 MED ORDER — CHLORHEXIDINE GLUCONATE CLOTH 2 % EX PADS
6.0000 | MEDICATED_PAD | Freq: Every day | CUTANEOUS | Status: DC
  Administered 2022-11-20 – 2022-11-23 (×4): 6 via TOPICAL

## 2022-11-20 MED ORDER — CARBIDOPA-LEVODOPA 25-100 MG PO TABS
1.0000 | ORAL_TABLET | Freq: Four times a day (QID) | ORAL | Status: DC
  Administered 2022-11-20 – 2022-11-24 (×15): 1 via ORAL
  Filled 2022-11-20 (×18): qty 1

## 2022-11-20 MED ORDER — OLANZAPINE 10 MG PO TABS
10.0000 mg | ORAL_TABLET | Freq: Every day | ORAL | Status: DC
  Administered 2022-11-20 – 2022-11-23 (×4): 10 mg via ORAL
  Filled 2022-11-20 (×4): qty 1

## 2022-11-20 MED ORDER — OLANZAPINE 10 MG IM SOLR
2.5000 mg | Freq: Once | INTRAMUSCULAR | Status: AC
  Administered 2022-11-20: 2.5 mg via INTRAMUSCULAR
  Filled 2022-11-20: qty 10

## 2022-11-20 MED ORDER — DEXTROSE IN LACTATED RINGERS 5 % IV SOLN: INTRAVENOUS | Status: AC

## 2022-11-20 NOTE — Progress Notes (Signed)
    Patient Name: [name]           DOB: Jan 29, 1934  MRN: [id number]      Admission Date: 11/18/2022  Attending Provider: Rolly Salter, MD  Primary Diagnosis: Perforated diverticulum   Level of care: Stepdown    CROSS COVER NOTE   Date of Service   11/20/2022   [name], 88 y.o. female, was admitted on 11/18/2022 for Perforated diverticulum.    HPI/Events of Note   Agitated, restless, and confused Advanced dementia with known agitation and behavioral issues Currently restless attempting to get out of bed multiple times. Wearing soft waist restraint due to safety. High fall risk.  Unfortunately, she is not following commands despite multiple attempts at redirection by staff.  Patient has already received haldol for agitation which was ineffective. Per family, haldol "does not work well for her."    Interventions/ Plan   Will trial IM zyprexa for agitation.  Restraint to be discontinued as soon as patient is able to safely have them removed.        Anthoney Harada, DNP, ACNPC- AG [facility]

## 2022-11-20 NOTE — Evaluation (Signed)
Clinical/Bedside Swallow Evaluation Patient Details  Name: [name] MRN: [id number] Date of Birth: [birth year]  Today's Date: 11/20/2022 Time: SLP Start Time (ACUTE ONLY): 1630 SLP Stop Time (ACUTE ONLY): 1642 SLP Time Calculation (min) (ACUTE ONLY): 12 min  Past Medical History:  Past Medical History:  Diagnosis Date   Anorexia    Anxiety    Blood transfusion without reported diagnosis    1970's   Chronic diarrhea    CKD (chronic kidney disease)    Clotting disorder (HCC)    Dementia (HCC)    Depression    DVT (deep venous thrombosis) (HCC)    Endometrial ca (HCC)    GERD (gastroesophageal reflux disease)    Hiatal hernia    History of D&C    History of sinus surgery    Hypokalemia    IBS (irritable bowel syndrome)    Insomnia    Insomnia    Multiple thyroid nodules 12/2005   b/l nodules thyroid    Neuropathy    Parkinson's disease (HCC)    Pulmonary embolus (HCC)    Radiation Jan.3-Feb.6,2008   External Beam to pelvis   Radiation Feb.13,20 and 27/2008   Intracavitary brachytherapy   Seizure disorder (HCC)    Stroke (HCC)    R brain Left hemiparesis mild   Past Surgical History:  Past Surgical History:  Procedure Laterality Date   ABDOMINAL HYSTERECTOMY  08/11/05   [place],bso,pelvic lymphadenectomy   ankle fracture repair Left    antroscopy     maxillary   CHOLECYSTECTOMY     COLONOSCOPY  2012   benign colonic polyps   ESOPHAGOGASTRODUODENOSCOPY  2012   neg   IVC Filter  2007   s/p DVT and P.E>   LAPAROSCOPIC ASSISTED RADICAL VAGINAL HYSTERECTOMY W/ NODE BIOPSY  08/11/05   LYMPHADENECTOMY     rotator cuff repair Right    HPI:  Pt is an 88 yo admitted 11/13 from SNF for perforated diverticulum. PMH includes: dementia, Parkinson's disease, GERD, HH, stroke, falls, DVT, endometrial cancer, CKD, protein calorie malnutrition with cachexia and failure to thrive    Assessment / Plan / Recommendation  Clinical Impression  SLP was asked to assess pt with  clear liquids only. Pt is not consistently following commands for completion of oral motor exam but she is without obvious focal deficits. She does not attend well to boluses and does not show much interest in them, but she did take in a few small sips of water (one via spoon, otherwise via straw). She seems to swallow fairly swiftly without oral holding and she has no overt s/s of aspiration. I do not think she would have adequate PO intake, but this appears to be a more chronic issue. She seems appropriate to start a clear liquid diet with thin liquids and careful assistance during intake if MD thinks she is medically ready to do so. SLP Visit Diagnosis: Dysphagia, unspecified (R13.10)    Aspiration Risk  Moderate aspiration risk;Risk for inadequate nutrition/hydration    Diet Recommendation Thin liquid (can begin clear liquid diet per MD)    Liquid Administration via: Spoon;Cup;Straw Medication Administration: Via alternative means Supervision: Staff to assist with self feeding;Full supervision/cueing for compensatory strategies Compensations: Minimize environmental distractions;Slow rate;Small sips/bites Postural Changes: Seated upright at 90 degrees;Remain upright for at least 30 minutes after po intake    Other  Recommendations Oral Care Recommendations: Oral care QID    Recommendations for follow up therapy are one component of a multi-disciplinary discharge planning process,  led by the attending physician.  Recommendations may be updated based on patient status, additional functional criteria and insurance authorization.  Follow up Recommendations No SLP follow up      Assistance Recommended at Discharge    Functional Status Assessment Patient has had a recent decline in their functional status and demonstrates the ability to make significant improvements in function in a reasonable and predictable amount of time.  Frequency and Duration min 2x/week  2 weeks       Prognosis  Prognosis for improved oropharyngeal function: Fair Barriers to Reach Goals: Cognitive deficits      Swallow Study   General HPI: Pt is an 88 yo admitted 11/13 from SNF for perforated diverticulum. PMH includes: dementia, Parkinson's disease, GERD, HH, stroke, falls, DVT, endometrial cancer, CKD, protein calorie malnutrition with cachexia and failure to thrive Type of Study: Bedside Swallow Evaluation Previous Swallow Assessment: none in chart Diet Prior to this Study: NPO Temperature Spikes Noted: No Respiratory Status: Room air History of Recent Intubation: No Behavior/Cognition: Alert;Confused;Requires cueing Oral Cavity Assessment:  (?dark coating on tongue but limited visibility) Oral Care Completed by SLP: No Oral Cavity - Dentition: Adequate natural dentition (as can be observed) Vision: Functional for self-feeding Self-Feeding Abilities: Total assist Patient Positioning: Upright in bed Baseline Vocal Quality: Low vocal intensity Volitional Cough: Cognitively unable to elicit Volitional Swallow: Able to elicit    Oral/Motor/Sensory Function Overall Oral Motor/Sensory Function: Other (comment) (not following commands consistently to assess directly, no overt focal deficits though)   Ice Chips Ice chips: Not tested   Thin Liquid Thin Liquid: Within functional limits Presentation: Spoon;Cup;Straw    Nectar Thick Nectar Thick Liquid: Not tested   Honey Thick Honey Thick Liquid: Not tested   Puree Puree: Not tested   Solid     Solid: Not tested      Mahala Menghini., M.A. CCC-SLP Acute Rehabilitation Services Office [phone]  Secure chat preferred  11/20/2022,4:51 PM

## 2022-11-20 NOTE — Progress Notes (Signed)
Palliative:  No family at bedside. Dr. Allena Katz has spoken with family today and reports no needs or changes today. I will continue to follow.   No needs  Yong Channel, NP Palliative Medicine Team Pager [phone] (Please see [email or web address] for schedule) Team Phone [phone]

## 2022-11-20 NOTE — Progress Notes (Signed)
[facility] Progress Note Patient: [name] [id number]:[id number] DOB: 09-Sep-1934 DOA: 11/18/2022  DOS: the patient was seen and examined on 11/20/2022  Brief hospital course: [facility], PMH of dementia, DVT, cancer, anticoagulation, CKD, protein calorie malnutrition with cachexia and failure to thrive present to the hospital with complaints of abdominal pain and agitation. Was found to have bowel perforation in the large intestine. Concern for stercoral colitis versus diverticulitis. General surgery and GI were consulted. Management is conservative for now. Per surgery, patient is not a candidate for surgical intervention.  Assessment and Plan: Perforated viscus. Concern for sigmoid diverticulitis. Appreciate surgical evaluation and consultation. Currently patient is not a candidate for surgical intervention. Given that there is some improvement recommended continue current nonoperative conservative measures. For now we will be utilizing IV antibiotics and IV pain medication. Patient had a BM.  No blood in the stool.  Discussed with general surgery.  Will advance to clear liquid diet based on SLP evaluation.  Goals of care conversation. Multiple providers have discussed with family with regards to patient's prognosis. Patient is not a good candidate for surgery. Recommendation is for nonoperative management. Palliative care has been consulted. Daughter [name] have recommended to continue full code for now per my conversation based on 11/15. Unfortunately prognosis is still poor regardless of the outcome of hospitalization given her malnutrition and presentation with bowel perforation.  History of DVT on chronic anticoagulation. Initial presentation is actually secondary to BRBPR. Given that as well as the presentation with bowel perforation currently anticoagulation is on hold. Will continue to hold anticoagulation but if H&H remains labile will  initiate IV heparin on 11/15 without a bolus.  Advanced dementia with behavioral issue. Ongoing agitation. Will resume Zyprexa.  Parkinson's disease. Associated with dementia. Patient is on carbidopa.  Will resume and monitor.  Hypophosphatemia. Replaced.  Acute kidney injury. Baseline serum creatinine appears to be around 1.3. On admission serum creatinine was 1.7. Currently improving to 1.3 again with hydration. I have informed the family that there is a chance that we might be able to make the numbers look better but it does not mean that the patient may be appearing better or progressing better.  Normocytic anemia. Baseline hemoglobin around 10. Hemoglobin on admission was around 9.3-10.5. Currently remaining stable around 8. Reported BRBPR on admission but so far no bleeding reassuringly. Hold anticoagulation and Monitor for now.  Cachexia. Concern for adult failure to thrive. Underweight. Body mass index is 15.68 kg/m.  Likely in the setting of progressive dementia. Prognosis is very poor.  Will consult dietitian. Monitor   Subjective: No nausea no vomiting.  Able to verbalize but unable to follow commands.  Speech not clear but likely at her baseline she does have some incoherence in the setting of Parkinson's per family.  Physical Exam: General: in Mild distress, No Rash Cardiovascular: S1 and S2 Present, No Murmur Respiratory: Good respiratory effort, Bilateral Air entry present. No Crackles, No wheezes Abdomen: Bowel Sound present, soft, difficult to assess tenderness, but patient grimaced only once in periumbilical region on examination. Extremities: No edema Neuro: Alert and not limited.  Moving all extremities unable to follow any commands.  Data Reviewed: I have Reviewed nursing notes, Vitals, and Lab results. Since last encounter, pertinent lab results CBC and BMP   . I have ordered test including CBC and BMP  . I have discussed pt's care plan and test  results with palliative care and general surgery  .  Disposition:  Status is: Inpatient Remains inpatient appropriate because: Requiring IV antibiotics  Place and maintain sequential compression device Start: 11/19/22 0827   Family Communication: Daughter at bedside as well as multiple other family members at bedside. Level of care: Telemetry transfer from stepdown to telemetry. Vitals:   11/20/22 1100 11/20/22 1200 11/20/22 1245 11/20/22 1300  BP: (!) 141/59 (!) 152/55  (!) 154/68  Pulse: 65 69 84 89  Resp: 13 (!) 23 18 (!) 21  Temp:  97.8 F (36.6 C)    TempSrc:  Axillary    SpO2: 99% 100% 99% 100%  Weight:      Height:         Author: Lynden Oxford, MD 11/20/2022 6:10 PM  Please look on [email or web address] to find out who is on call.

## 2022-11-21 DIAGNOSIS — K5780 Diverticulitis of intestine, part unspecified, with perforation and abscess without bleeding: Secondary | ICD-10-CM | POA: Diagnosis not present

## 2022-11-21 LAB — BASIC METABOLIC PANEL
Anion gap: 5 (ref 5–15)
BUN: 11 mg/dL (ref 8–23)
CO2: 26 mmol/L (ref 22–32)
Calcium: 8.8 mg/dL — ABNORMAL LOW (ref 8.9–10.3)
Chloride: 106 mmol/L (ref 98–111)
Creatinine, Ser: 1.14 mg/dL — ABNORMAL HIGH (ref 0.44–1.00)
GFR, Estimated: 47 mL/min — ABNORMAL LOW (ref >60)
Glucose, Bld: 100 mg/dL — ABNORMAL HIGH (ref 70–99)
Potassium: 3.2 mmol/L — ABNORMAL LOW (ref 3.5–5.1)
Sodium: 137 mmol/L (ref 135–145)

## 2022-11-21 LAB — CBC
HCT: 26.6 % — ABNORMAL LOW (ref 36.0–46.0)
Hemoglobin: 8.1 g/dL — ABNORMAL LOW (ref 12.0–15.0)
MCH: 29.6 pg (ref 26.0–34.0)
MCHC: 30.5 g/dL (ref 30.0–36.0)
MCV: 97.1 fL (ref 80.0–100.0)
Platelets: 239 10*3/uL (ref 150–400)
RBC: 2.74 MIL/uL — ABNORMAL LOW (ref 3.87–5.11)
RDW: 13.5 % (ref 11.5–15.5)
WBC: 7.5 10*3/uL (ref 4.0–10.5)
nRBC: 0 % (ref 0.0–0.2)

## 2022-11-21 LAB — PHOSPHORUS: Phosphorus: 2.1 mg/dL — ABNORMAL LOW (ref 2.5–4.6)

## 2022-11-21 LAB — MAGNESIUM: Magnesium: 1.9 mg/dL (ref 1.7–2.4)

## 2022-11-21 MED ORDER — K PHOS MONO-SOD PHOS DI & MONO 155-852-130 MG PO TABS
500.0000 mg | ORAL_TABLET | Freq: Three times a day (TID) | ORAL | Status: DC
  Administered 2022-11-21 (×2): 500 mg via ORAL
  Filled 2022-11-21 (×3): qty 2

## 2022-11-21 MED ORDER — BOOST / RESOURCE BREEZE PO LIQD CUSTOM
1.0000 | Freq: Three times a day (TID) | ORAL | Status: DC
  Administered 2022-11-21 – 2022-11-23 (×6): 1 via ORAL

## 2022-11-21 MED ORDER — FAMOTIDINE 20 MG PO TABS
20.0000 mg | ORAL_TABLET | Freq: Every day | ORAL | Status: DC
  Administered 2022-11-22 – 2022-11-24 (×3): 20 mg via ORAL
  Filled 2022-11-21 (×3): qty 1

## 2022-11-21 MED ORDER — POTASSIUM PHOSPHATES 15 MMOLE/5ML IV SOLN
15.0000 mmol | Freq: Once | INTRAVENOUS | Status: DC
  Filled 2022-11-21: qty 5

## 2022-11-21 MED ORDER — POTASSIUM CHLORIDE CRYS ER 20 MEQ PO TBCR: 40.0000 meq | EXTENDED_RELEASE_TABLET | Freq: Once | ORAL | Status: DC

## 2022-11-21 MED ORDER — HYDRALAZINE HCL 20 MG/ML IJ SOLN: 10.0000 mg | INTRAMUSCULAR | Status: DC | PRN

## 2022-11-21 NOTE — Plan of Care (Signed)
  Problem: Clinical Measurements: Goal: Ability to maintain clinical measurements within normal limits will improve Outcome: Progressing   Problem: Safety: Goal: Ability to remain free from injury will improve Outcome: Progressing   Problem: Clinical Measurements: Goal: Will remain free from infection Outcome: Not Progressing   Problem: Nutrition: Goal: Adequate nutrition will be maintained Outcome: Not Progressing   Problem: Coping: Goal: Level of anxiety will decrease Outcome: Not Progressing

## 2022-11-21 NOTE — Plan of Care (Signed)
  Problem: Health Behavior/Discharge Planning: Goal: Ability to manage health-related needs will improve Outcome: Progressing   

## 2022-11-21 NOTE — Progress Notes (Signed)
[facility] Progress Note Patient: [name] [id number]:[id number] DOB: [birth year] DOA: 11/18/2022  DOS: the patient was seen and examined on 11/21/2022  Brief hospital course: Patient presenting from [facility], PMH of dementia, DVT, cancer, anticoagulation, CKD, protein calorie malnutrition with cachexia and failure to thrive present to the hospital with complaints of abdominal pain and agitation. Was found to have bowel perforation in the large intestine. Concern for stercoral colitis versus diverticulitis. General surgery and GI were consulted. Management is conservative for now. Per surgery, patient is not a candidate for surgical intervention.  Assessment and Plan: Perforated viscus. Concern for sigmoid diverticulitis. Appreciate surgical evaluation and consultation. Currently patient is not a candidate for surgical intervention. Given that there is some improvement recommended continue current nonoperative conservative measures. For now we will be utilizing IV antibiotics and IV pain medication. Patient had a BM.  No blood in the stool.   Discussed with general surgery on 11/15. Drowsy therefore unable to eat much.  For now we will continue clear liquid diet but add boost.   Goals of care conversation. Multiple providers have discussed with family with regards to patient's prognosis. Patient is not a good candidate for surgery. Recommendation is for nonoperative management. Palliative care has been consulted. Daughter [name] have recommended to continue full code for now per my conversation based on 11/15. Unfortunately prognosis is still poor regardless of the outcome of hospitalization given her malnutrition and presentation with bowel perforation.  History of DVT on chronic anticoagulation. Initial presentation is actually secondary to BRBPR. Given that as well as the presentation with bowel perforation currently anticoagulation is on hold.  Advanced dementia  with behavioral issue. Continue Zyprexa. On Remeron at home.  This was resumed in the hospital.  Although patient is significantly drowsy today therefore we will discontinue that medication.  Parkinson's disease. Associated with dementia. Patient is on carbidopa.  Continue.  Hypophosphatemia. Mild again.  Will replace and monitor.  Acute kidney injury. Baseline serum creatinine appears to be around 1.3. On admission serum creatinine was 1.7. Currently improving with hydration.  Hypokalemia. Replacing as well.  Normocytic anemia. Baseline hemoglobin around 10. Hemoglobin on admission was around 9.3-10.5. Currently remaining stable around 8. Reported BRBPR on admission but so far no bleeding reassuringly. Hold anticoagulation and Monitor for now.  Cachexia. Concern for adult failure to thrive. Underweight. Body mass index is 15.68 kg/m.  Likely in the setting of progressive dementia. Prognosis is very poor.  Will consult dietitian. Monitor   Subjective: Drowsy.  No nausea no vomiting no fever no chills.  Had couple of bowel movement today without any blood.  Physical Exam: General: in Mild distress, No Rash Cardiovascular: S1 and S2 Present, No Murmur Respiratory: Good respiratory effort, Bilateral Air entry present. No Crackles, No wheezes Abdomen: Bowel Sound present, difficult to assess tenderness, no grimacing Extremities: No edema Neuro: Drowsy but awakens easily and incoherent speech, unchanged from prior, no new focal deficit  Data Reviewed: I have Reviewed nursing notes, Vitals, and Lab results. Since last encounter, pertinent lab results CBC and BMP   . I have ordered test including CBC and BMP  .   Disposition: Status is: Inpatient Remains inpatient appropriate because: Monitor for improvement in alertness and diet tolerance  Place and maintain sequential compression device Start: 11/19/22 0827   Family Communication: No one at bedside Level of care:  Telemetry   Vitals:   11/20/22 1300 11/20/22 2045 11/21/22 0612 11/21/22 0843  BP: (!) 154/68 (!) 153/118 Marland Kitchen)  154/68 (!) 156/66  Pulse: 89 62 67 72  Resp: (!) 21 18    Temp:  98.7 F (37.1 C) (!) 97.5 F (36.4 C) 98 F (36.7 C)  TempSrc:  Oral Oral Axillary  SpO2: 100% 98% 100% 100%  Weight:      Height:         Author: Lynden Oxford, MD 11/21/2022 6:14 PM  Please look on [email or web address] to find out who is on call.

## 2022-11-21 NOTE — Plan of Care (Signed)
?  Problem: Clinical Measurements: Goal: Will remain free from infection Outcome: Progressing   Problem: Clinical Measurements: Goal: Diagnostic test results will improve Outcome: Progressing   Problem: Nutrition: Goal: Adequate nutrition will be maintained Outcome: Progressing   Problem: Coping: Goal: Level of anxiety will decrease Outcome: Progressing   

## 2022-11-22 DIAGNOSIS — K5780 Diverticulitis of intestine, part unspecified, with perforation and abscess without bleeding: Secondary | ICD-10-CM | POA: Diagnosis not present

## 2022-11-22 LAB — CBC
HCT: 25.6 % — ABNORMAL LOW (ref 36.0–46.0)
Hemoglobin: 8.2 g/dL — ABNORMAL LOW (ref 12.0–15.0)
MCH: 30.5 pg (ref 26.0–34.0)
MCHC: 32 g/dL (ref 30.0–36.0)
MCV: 95.2 fL (ref 80.0–100.0)
Platelets: 251 10*3/uL (ref 150–400)
RBC: 2.69 MIL/uL — ABNORMAL LOW (ref 3.87–5.11)
RDW: 13.7 % (ref 11.5–15.5)
WBC: 6.3 10*3/uL (ref 4.0–10.5)
nRBC: 0 % (ref 0.0–0.2)

## 2022-11-22 LAB — HEPARIN LEVEL (UNFRACTIONATED): Heparin Unfractionated: 0.14 [IU]/mL — ABNORMAL LOW (ref 0.30–0.70)

## 2022-11-22 LAB — APTT: aPTT: 35 s (ref 24–36)

## 2022-11-22 LAB — PHOSPHORUS: Phosphorus: 3.4 mg/dL (ref 2.5–4.6)

## 2022-11-22 LAB — BASIC METABOLIC PANEL
Anion gap: 10 (ref 5–15)
BUN: 7 mg/dL — ABNORMAL LOW (ref 8–23)
CO2: 24 mmol/L (ref 22–32)
Calcium: 9.1 mg/dL (ref 8.9–10.3)
Chloride: 110 mmol/L (ref 98–111)
Creatinine, Ser: 1.13 mg/dL — ABNORMAL HIGH (ref 0.44–1.00)
GFR, Estimated: 47 mL/min — ABNORMAL LOW (ref >60)
Glucose, Bld: 78 mg/dL (ref 70–99)
Potassium: 3.2 mmol/L — ABNORMAL LOW (ref 3.5–5.1)
Sodium: 144 mmol/L (ref 135–145)

## 2022-11-22 LAB — MAGNESIUM: Magnesium: 1.9 mg/dL (ref 1.7–2.4)

## 2022-11-22 MED ORDER — PANTOPRAZOLE SODIUM 40 MG PO TBEC
40.0000 mg | DELAYED_RELEASE_TABLET | Freq: Two times a day (BID) | ORAL | Status: DC
  Administered 2022-11-22 – 2022-11-24 (×4): 40 mg via ORAL
  Filled 2022-11-22 (×4): qty 1

## 2022-11-22 MED ORDER — HEPARIN (PORCINE) 25000 UT/250ML-% IV SOLN
800.0000 [IU]/h | INTRAVENOUS | Status: DC
  Administered 2022-11-22: 650 [IU]/h via INTRAVENOUS
  Filled 2022-11-22: qty 250

## 2022-11-22 MED ORDER — THIAMINE MONONITRATE 100 MG PO TABS
100.0000 mg | ORAL_TABLET | Freq: Every day | ORAL | Status: DC
  Administered 2022-11-23 – 2022-11-24 (×2): 100 mg via ORAL
  Filled 2022-11-22 (×2): qty 1

## 2022-11-22 MED ORDER — POTASSIUM CHLORIDE CRYS ER 20 MEQ PO TBCR
40.0000 meq | EXTENDED_RELEASE_TABLET | Freq: Once | ORAL | Status: AC
  Administered 2022-11-22: 40 meq via ORAL
  Filled 2022-11-22: qty 2

## 2022-11-22 NOTE — Progress Notes (Signed)
[facility] Progress Note Patient: [name] [id number]:[id number] DOB: 07/28/1934 DOA: 11/18/2022  DOS: the patient was seen and examined on 11/22/2022  Brief hospital course: Patient presenting from [place] [facility], PMH of dementia, DVT, cancer, anticoagulation, CKD, protein calorie malnutrition with cachexia and failure to thrive present to the hospital with complaints of abdominal pain and agitation. Was found to have bowel perforation in the large intestine. Concern for stercoral colitis versus diverticulitis. General surgery and GI were consulted. Management is conservative for now. Per surgery, patient is not a candidate for surgical intervention.  Assessment and Plan: Perforated viscus. Concern for sigmoid diverticulitis. Appreciate surgical evaluation and consultation. Currently patient is not a candidate for surgical intervention. Given that there is some improvement recommended continue current nonoperative conservative measures. For now we will be utilizing IV antibiotics and IV pain medication. Patient had a BM.  No blood in the stool.   Discussed with general surgery on 11/15. Drowsy therefore unable to eat much.  For now we will continue clear liquid diet but add boost.   Goals of care conversation. Multiple providers have discussed with family with regards to patient's prognosis. Patient is not a good candidate for surgery. Recommendation is for nonoperative management. Palliative care has been consulted. Daughter [name] have recommended to continue full code for now per my conversation based on 11/15. Unfortunately prognosis is still poor regardless of the outcome of hospitalization given her malnutrition and presentation with bowel perforation.  History of DVT on chronic anticoagulation. Initial presentation is actually secondary to BRBPR. Given that as well as the presentation with bowel perforation currently anticoagulation is on hold.  Will resume with IV  heparin.  Advanced dementia with behavioral issue. Continue Zyprexa. On Remeron at home.  This was resumed in the hospital.  Although patient is significantly drowsy today therefore we will discontinue that medication.  Parkinson's disease. Associated with dementia. Patient is on carbidopa.  Continue.  Hypophosphatemia. Mild again.  Will replace and monitor.  Acute kidney injury. Baseline serum creatinine appears to be around 1.3. On admission serum creatinine was 1.7. Currently improving with hydration.  Hypokalemia. Replacing as well.  Normocytic anemia. Baseline hemoglobin around 10. Hemoglobin on admission was around 9.3-10.5. Currently remaining stable around 8. Reported BRBPR on admission but so far no bleeding reassuringly. Hold anticoagulation and Monitor for now.  Cachexia. Concern for adult failure to thrive. Underweight. Body mass index is 15.68 kg/m.  Likely in the setting of progressive dementia. Prognosis is very poor.  Will consult dietitian. Monitor   Subjective: More alert and able to follow commands.  Unable to answer any questions.  No abdominal pain reported.  Physical Exam: Clear to auscultation. S1-S2 present. Bowel sound present but Diffusely tender. Trace edema.  Data Reviewed: I have Reviewed nursing notes, Vitals, and Lab results. Reviewed CBC and CMP. Reorder CBC and CMP.  Disposition: Status is: Inpatient Remains inpatient appropriate because: Monitor for improvement in alertness and diet tolerance  Place and maintain sequential compression device Start: 11/19/22 0827   Family Communication: No one at bedside, discussed with daughter on the phone. Level of care: Telemetry   Vitals:   11/21/22 2140 11/22/22 0606 11/22/22 0638 11/22/22 1758  BP: 131/61 (!) 155/63 138/69 (!) 114/59  Pulse: 88 71  73  Resp: 19     Temp: 98.3 F (36.8 C) 98.5 F (36.9 C)  99.1 F (37.3 C)  TempSrc: Oral Oral  Axillary  SpO2: 98% 100%  99%   Weight:  Height:         Author: Lynden Oxford, MD 11/22/2022 7:14 PM  Please look on [email or web address] to find out who is on call.

## 2022-11-22 NOTE — Plan of Care (Signed)

## 2022-11-22 NOTE — Progress Notes (Signed)
PHARMACY - ANTICOAGULATION CONSULT NOTE  Pharmacy Consult for IV heparin Indication: Hx DVT  Allergies  Allergen Reactions   Aspridrox [Aspirin Buf(Alhyd-Mghyd-Cacar)] Other (See Comments)    Unknown  Not documented on MAR   Codeine Other (See Comments)    Unknown   Darvocet [Propoxyphene N-Acetaminophen] Other (See Comments)    Unknown  Not documented on MAR   Esomeprazole Magnesium Other (See Comments)    Unknown  Not documented on MAR   Iodine Other (See Comments)    unknown   Morphine And Codeine Other (See Comments)    unknown   Sulfa Antibiotics Other (See Comments)    Unknown  Not documented on MAR   Sulfate Other (See Comments)    Unknown  Not documented on MAR   Tramadol Other (See Comments)    Unknown  Not documented on [facility]    Patient Measurements: Height: 5\' 7"  (170.2 cm) Weight: 45.4 kg (100 lb 1.4 oz) IBW/kg (Calculated) : 61.6 Heparin Dosing Weight: TBW  Vital Signs: Temp: 98.1 F (36.7 C) (11/17 1947) Temp Source: Oral (11/17 1947) BP: 132/46 (11/17 1947) Pulse Rate: 70 (11/17 1947)  Labs: Recent Labs    11/20/22 0313 11/21/22 0702 11/22/22 0548 11/22/22 1146 11/22/22 2157  HGB 8.6* 8.1*  --  8.2*  --   HCT 26.9* 26.6*  --  25.6*  --   PLT 215 239  --  251  --   APTT  --   --   --   --  35  HEPARINUNFRC  --   --   --   --  0.14*  CREATININE 1.31* 1.14* 1.13*  --   --     Estimated Creatinine Clearance: 25.6 mL/min (A) (by C-G formula based on SCr of 1.13 mg/dL (H)).   Medical History: Past Medical History:  Diagnosis Date   Anorexia    Anxiety    Blood transfusion without reported diagnosis    1970's   Chronic diarrhea    CKD (chronic kidney disease)    Clotting disorder (HCC)    Dementia (HCC)    Depression    DVT (deep venous thrombosis) (HCC)    Endometrial ca (HCC)    GERD (gastroesophageal reflux disease)    Hiatal hernia    History of D&C    History of sinus surgery    Hypokalemia    IBS (irritable bowel  syndrome)    Insomnia    Insomnia    Multiple thyroid nodules 12/2005   b/l nodules thyroid    Neuropathy    Parkinson's disease (HCC)    Pulmonary embolus (HCC)    Radiation Jan.3-Feb.6,2008   External Beam to pelvis   Radiation Feb.13,20 and 27/2008   Intracavitary brachytherapy   Seizure disorder (HCC)    Stroke (HCC)    R brain Left hemiparesis mild    Medications:  Medications Prior to Admission  Medication Sig Dispense Refill Last Dose   acetaminophen (TYLENOL) 650 MG CR tablet Take 1,300 mg by mouth every 8 (eight) hours as needed for pain.   Unk   apixaban (ELIQUIS) 2.5 MG TABS tablet Take 2.5 mg by mouth 2 (two) times daily.   11/17/2022 at 1000   calcium carbonate (TUMS EX) 750 MG chewable tablet Chew 750 mg by mouth 2 (two) times daily.   11/17/2022   carbidopa-levodopa (SINEMET IR) 25-100 MG tablet Take 1 tablet by mouth 4 (four) times daily. 1000, 1400, 1800, 2200   11/17/2022   cholecalciferol (VITAMIN D)  1000 UNITS tablet Take 1,000 Units by mouth daily.   11/17/2022   cholestyramine (QUESTRAN) 4 g packet Take 1 packet (4 g total) by mouth daily. Take 1.5 hours away from other medications. 60 each 12 11/17/2022   Cyanocobalamin (VITAMIN B 12) 100 MCG LOZG Take 1,000 mcg by mouth daily.   11/17/2022   escitalopram (LEXAPRO) 5 MG tablet Take 1 tablet (5 mg total) by mouth daily. (Patient taking differently: Take 10 mg by mouth daily.) 30 tablet 0 11/17/2022   famotidine (PEPCID) 20 MG tablet Take 20 mg by mouth daily.   11/17/2022   loperamide (IMODIUM A-D) 2 MG tablet Take 4 mg by mouth in the morning, at noon, and at bedtime.   11/17/2022   magnesium gluconate (MAGONATE) 500 MG tablet Take 500 mg by mouth 2 (two) times daily.   11/17/2022   mirtazapine (REMERON) 15 MG tablet Take 15 mg by mouth at bedtime.   11/16/2022   NON FORMULARY Take 120 mLs by mouth 2 (two) times daily. Med Pass   11/17/2022   NONFORMULARY OR COMPOUNDED ITEM Apply 1 Application topically daily  as needed (Severe anxiety.). Ativan Gel: Apply (1mg /ml) 1mL once every 24 hours as needed.   11/17/2022   OLANZapine (ZYPREXA) 10 MG tablet Take 10 mg by mouth at bedtime.   11/16/2022   Skin Protectants, Misc. (MINERIN CREME EX) Apply 1 Application topically daily.   11/17/2022   Scheduled:   carbidopa-levodopa  1 tablet Oral QID   Chlorhexidine Gluconate Cloth  6 each Topical Q2200   famotidine  20 mg Oral Daily   feeding supplement  1 Container Oral TID BM   OLANZapine  10 mg Oral QHS   mouth rinse  15 mL Mouth Rinse 4 times per day   pantoprazole  40 mg Oral BID   [START ON 11/23/2022] thiamine  100 mg Oral Daily   PRN: acetaminophen, fentaNYL (SUBLIMAZE) injection **OR** fentaNYL (SUBLIMAZE) injection, haloperidol lactate, hydrALAZINE, lip balm, ondansetron (ZOFRAN) IV, mouth rinse  Assessment: 84 yoF with PMH advanced dementia, chronically on Eliquis for Hx DVT, CKD, FTT, remote Hx uterine CA admitted 11/13 with hematochezia felt d/t perforated sigmoid diverticulitis. Not a surgical candidate. Eliquis was held on admission, but given stable CBC (did not require transfusion) and no further hematochezia, Pharmacy has been consulted to start patient on trial of IV heparin before resuming Eliquis.  Of note, on 11/13, Critical Care H&P recommended stopping Eliquis permanently d/t falls, declining functional status, and concurrent IVC filter. When I discussed CCM's recommendation with [facility] hospitalist, he stated family wishes patient to resume anticoagulation.  Baseline INR elevated on admission d/t recent DOAC, aPTT WNL Prior anticoagulation: Eliquis 2.5 mg bid; LD 11/12  Significant events:  Today, 11/22/2022: CBC: Hgb low but stable; Plt stable WNL aPTT = 35 and HL = 0.14 (both subtherapeutic) with heparin gtt @ 650 units/hr RN verified no interruption in heparin infusion RN reported no bleeding issues  Goal of Therapy: Heparin level 0.3-0.7 units/ml Monitor platelets by  anticoagulation protocol: Yes  Plan: Increase Heparin to 800 units/hr IV infusion; no bolus As heparin level and APTT correlate, will monitor heparin therapy with only heparin levels Check heparin level 8 hrs after rate increase Daily CBC, daily heparin level  Monitor for signs of bleeding or thrombosis  Terrilee Files, PharmD 11/22/2022, 11:47 PM

## 2022-11-22 NOTE — Progress Notes (Signed)
PHARMACY - ANTICOAGULATION CONSULT NOTE  Pharmacy Consult for IV heparin Indication: Hx DVT  Allergies  Allergen Reactions   Aspridrox [Aspirin Buf(Alhyd-Mghyd-Cacar)] Other (See Comments)    Unknown  Not documented on MAR   Codeine Other (See Comments)    Unknown   Darvocet [Propoxyphene N-Acetaminophen] Other (See Comments)    Unknown  Not documented on MAR   Esomeprazole Magnesium Other (See Comments)    Unknown  Not documented on MAR   Iodine Other (See Comments)    unknown   Morphine And Codeine Other (See Comments)    unknown   Sulfa Antibiotics Other (See Comments)    Unknown  Not documented on MAR   Sulfate Other (See Comments)    Unknown  Not documented on MAR   Tramadol Other (See Comments)    Unknown  Not documented on [facility]    Patient Measurements: Height: 5\' 7"  (170.2 cm) Weight: 45.4 kg (100 lb 1.4 oz) IBW/kg (Calculated) : 61.6 Heparin Dosing Weight: TBW  Vital Signs: Temp: 98.5 F (36.9 C) (11/17 0606) Temp Source: Oral (11/17 0606) BP: 138/69 (11/17 0638) Pulse Rate: 71 (11/17 0606)  Labs: Recent Labs    11/20/22 0313 11/21/22 0702 11/22/22 0548 11/22/22 1146  HGB 8.6* 8.1*  --  8.2*  HCT 26.9* 26.6*  --  25.6*  PLT 215 239  --  251  CREATININE 1.31* 1.14* 1.13*  --     Estimated Creatinine Clearance: 25.6 mL/min (A) (by C-G formula based on SCr of 1.13 mg/dL (H)).   Medical History: Past Medical History:  Diagnosis Date   Anorexia    Anxiety    Blood transfusion without reported diagnosis    1970's   Chronic diarrhea    CKD (chronic kidney disease)    Clotting disorder (HCC)    Dementia (HCC)    Depression    DVT (deep venous thrombosis) (HCC)    Endometrial ca (HCC)    GERD (gastroesophageal reflux disease)    Hiatal hernia    History of D&C    History of sinus surgery    Hypokalemia    IBS (irritable bowel syndrome)    Insomnia    Insomnia    Multiple thyroid nodules 12/2005   b/l nodules thyroid     Neuropathy    Parkinson's disease (HCC)    Pulmonary embolus (HCC)    Radiation Jan.3-Feb.6,2008   External Beam to pelvis   Radiation Feb.13,20 and 27/2008   Intracavitary brachytherapy   Seizure disorder (HCC)    Stroke (HCC)    R brain Left hemiparesis mild    Medications:  Medications Prior to Admission  Medication Sig Dispense Refill Last Dose   acetaminophen (TYLENOL) 650 MG CR tablet Take 1,300 mg by mouth every 8 (eight) hours as needed for pain.   Unk   apixaban (ELIQUIS) 2.5 MG TABS tablet Take 2.5 mg by mouth 2 (two) times daily.   11/17/2022 at 1000   calcium carbonate (TUMS EX) 750 MG chewable tablet Chew 750 mg by mouth 2 (two) times daily.   11/17/2022   carbidopa-levodopa (SINEMET IR) 25-100 MG tablet Take 1 tablet by mouth 4 (four) times daily. 1000, 1400, 1800, 2200   11/17/2022   cholecalciferol (VITAMIN D) 1000 UNITS tablet Take 1,000 Units by mouth daily.   11/17/2022   cholestyramine (QUESTRAN) 4 g packet Take 1 packet (4 g total) by mouth daily. Take 1.5 hours away from other medications. 60 each 12 11/17/2022   Cyanocobalamin (VITAMIN B  12) 100 MCG LOZG Take 1,000 mcg by mouth daily.   11/17/2022   escitalopram (LEXAPRO) 5 MG tablet Take 1 tablet (5 mg total) by mouth daily. (Patient taking differently: Take 10 mg by mouth daily.) 30 tablet 0 11/17/2022   famotidine (PEPCID) 20 MG tablet Take 20 mg by mouth daily.   11/17/2022   loperamide (IMODIUM A-D) 2 MG tablet Take 4 mg by mouth in the morning, at noon, and at bedtime.   11/17/2022   magnesium gluconate (MAGONATE) 500 MG tablet Take 500 mg by mouth 2 (two) times daily.   11/17/2022   mirtazapine (REMERON) 15 MG tablet Take 15 mg by mouth at bedtime.   11/16/2022   NON FORMULARY Take 120 mLs by mouth 2 (two) times daily. Med Pass   11/17/2022   NONFORMULARY OR COMPOUNDED ITEM Apply 1 Application topically daily as needed (Severe anxiety.). Ativan Gel: Apply (1mg /ml) 1mL once every 24 hours as needed.    11/17/2022   OLANZapine (ZYPREXA) 10 MG tablet Take 10 mg by mouth at bedtime.   11/16/2022   Skin Protectants, Misc. (MINERIN CREME EX) Apply 1 Application topically daily.   11/17/2022   Scheduled:   carbidopa-levodopa  1 tablet Oral QID   Chlorhexidine Gluconate Cloth  6 each Topical Q2200   famotidine  20 mg Oral Daily   feeding supplement  1 Container Oral TID BM   OLANZapine  10 mg Oral QHS   mouth rinse  15 mL Mouth Rinse 4 times per day   pantoprazole  40 mg Oral BID   [START ON 11/23/2022] thiamine  100 mg Oral Daily   PRN: acetaminophen, fentaNYL (SUBLIMAZE) injection **OR** fentaNYL (SUBLIMAZE) injection, haloperidol lactate, hydrALAZINE, lip balm, ondansetron (ZOFRAN) IV, mouth rinse  Assessment: 7 yoF with PMH advanced dementia, chronically on Eliquis for Hx DVT, CKD, FTT, remote Hx uterine CA admitted 11/13 with hematochezia felt d/t perforated sigmoid diverticulitis. Not a surgical candidate. Eliquis was held on admission, but given stable CBC (did not require transfusion) and no further hematochezia, Pharmacy has been consulted to start patient on trial of IV heparin before resuming Eliquis.  Of note, on 11/13, Critical Care H&P recommended stopping Eliquis permanently d/t falls, declining functional status, and concurrent IVC filter. When I discussed CCM's recommendation with [facility] hospitalist, he stated family wishes patient to resume anticoagulation.  Baseline INR elevated on admission d/t recent DOAC, aPTT WNL Prior anticoagulation: Eliquis 2.5 mg bid; LD 11/12  Significant events:  Today, 11/22/2022: CBC: Hgb low but stable; Plt stable WNL No bleeding issues documented AKI on admission appears to have resolved; SCr at/below baseline  Goal of Therapy: Heparin level 0.3-0.7 units/ml Monitor platelets by anticoagulation protocol: Yes  Plan: Heparin 650 units/hr IV infusion; no bolus Check heparin level and aPTT 8 hrs after start Daily CBC, daily heparin level  once stable; aPTTs as needed as long as DOAC effects persist Monitor for signs of bleeding or thrombosis  Bernadene Person, PharmD, BCPS [phone] 11/22/2022, 1:05 PM

## 2022-11-22 NOTE — TOC Progression Note (Signed)
Transition of Care [facility]) - Progression Note    Patient Details  Name: OFA ISSA MRN: [id number] Date of Birth: 11/20/1934  Transition of Care [facility]) CM/SW Contact  Darleene Cleaver, [place] Phone Number: 11/22/2022, 7:18 PM  Clinical Narrative:     TOC continuing to follow patient's progress.  Patient from [facility].   Expected Discharge Plan: Skilled Nursing Facility Barriers to Discharge: Continued Medical Work up  Expected Discharge Plan and Services In-house Referral: Clinical Social Work   Post Acute Care Choice: Skilled Nursing Facility Living arrangements for the past 2 months: Skilled Nursing Facility                                       Social Determinants of Health (SDOH) Interventions SDOH Screenings   Food Insecurity: No Food Insecurity (11/20/2022)  Housing: Low Risk  (11/20/2022)  Transportation Needs: No Transportation Needs (11/20/2022)  Utilities: Not At Risk (11/20/2022)  Tobacco Use: Low Risk  (11/18/2022)    Readmission Risk Interventions    11/19/2022    5:50 PM  Readmission Risk Prevention Plan  Transportation Screening Complete  PCP or Specialist Appt within 5-7 Days Complete  Home Care Screening Complete  Medication Review (RN CM) Referral to Pharmacy

## 2022-11-23 DIAGNOSIS — E43 Unspecified severe protein-calorie malnutrition: Secondary | ICD-10-CM | POA: Insufficient documentation

## 2022-11-23 DIAGNOSIS — K5780 Diverticulitis of intestine, part unspecified, with perforation and abscess without bleeding: Secondary | ICD-10-CM | POA: Diagnosis not present

## 2022-11-23 LAB — CBC
HCT: 27.1 % — ABNORMAL LOW (ref 36.0–46.0)
Hemoglobin: 8.5 g/dL — ABNORMAL LOW (ref 12.0–15.0)
MCH: 30.7 pg (ref 26.0–34.0)
MCHC: 31.4 g/dL (ref 30.0–36.0)
MCV: 97.8 fL (ref 80.0–100.0)
Platelets: 326 10*3/uL (ref 150–400)
RBC: 2.77 MIL/uL — ABNORMAL LOW (ref 3.87–5.11)
RDW: 13.7 % (ref 11.5–15.5)
WBC: 7.7 10*3/uL (ref 4.0–10.5)
nRBC: 0 % (ref 0.0–0.2)

## 2022-11-23 LAB — BASIC METABOLIC PANEL
Anion gap: 7 (ref 5–15)
BUN: 8 mg/dL (ref 8–23)
CO2: 23 mmol/L (ref 22–32)
Calcium: 8.8 mg/dL — ABNORMAL LOW (ref 8.9–10.3)
Chloride: 112 mmol/L — ABNORMAL HIGH (ref 98–111)
Creatinine, Ser: 1.09 mg/dL — ABNORMAL HIGH (ref 0.44–1.00)
GFR, Estimated: 49 mL/min — ABNORMAL LOW (ref >60)
Glucose, Bld: 92 mg/dL (ref 70–99)
Potassium: 3.9 mmol/L (ref 3.5–5.1)
Sodium: 142 mmol/L (ref 135–145)

## 2022-11-23 LAB — CULTURE, BLOOD (ROUTINE X 2)
Culture: NO GROWTH
Culture: NO GROWTH
Special Requests: ADEQUATE
Special Requests: ADEQUATE

## 2022-11-23 LAB — HEPARIN LEVEL (UNFRACTIONATED): Heparin Unfractionated: 0.27 [IU]/mL — ABNORMAL LOW (ref 0.30–0.70)

## 2022-11-23 LAB — MAGNESIUM: Magnesium: 1.7 mg/dL (ref 1.7–2.4)

## 2022-11-23 LAB — PHOSPHORUS: Phosphorus: 2.3 mg/dL — ABNORMAL LOW (ref 2.5–4.6)

## 2022-11-23 MED ORDER — ADULT MULTIVITAMIN W/MINERALS CH
1.0000 | ORAL_TABLET | Freq: Every day | ORAL | Status: DC
  Administered 2022-11-23 – 2022-11-24 (×2): 1 via ORAL
  Filled 2022-11-23 (×2): qty 1

## 2022-11-23 MED ORDER — MELATONIN 5 MG PO TABS: 5.0000 mg | ORAL_TABLET | Freq: Every evening | ORAL | Status: DC | PRN

## 2022-11-23 MED ORDER — ACETAMINOPHEN 325 MG PO TABS: 650.0000 mg | ORAL_TABLET | Freq: Four times a day (QID) | ORAL | Status: DC | PRN

## 2022-11-23 MED ORDER — APIXABAN 2.5 MG PO TABS
2.5000 mg | ORAL_TABLET | Freq: Two times a day (BID) | ORAL | Status: DC
  Administered 2022-11-23 – 2022-11-24 (×3): 2.5 mg via ORAL
  Filled 2022-11-23 (×3): qty 1

## 2022-11-23 MED ORDER — ACETAMINOPHEN 650 MG RE SUPP: 650.0000 mg | RECTAL | Status: DC | PRN

## 2022-11-23 MED ORDER — AMOXICILLIN-POT CLAVULANATE 500-125 MG PO TABS
1.0000 | ORAL_TABLET | Freq: Two times a day (BID) | ORAL | Status: DC
  Administered 2022-11-23 – 2022-11-24 (×3): 1 via ORAL
  Filled 2022-11-23 (×3): qty 1

## 2022-11-23 MED ORDER — KATE FARMS STANDARD 1.4 PO LIQD
325.0000 mL | Freq: Two times a day (BID) | ORAL | Status: DC
  Administered 2022-11-23 – 2022-11-24 (×2): 325 mL via ORAL
  Filled 2022-11-23 (×3): qty 325

## 2022-11-23 NOTE — Progress Notes (Signed)
Speech Language Pathology Treatment: Dysphagia  Patient Details Name: [name] MRN: [id number] DOB: 18-Jun-1934 Today's Date: 11/23/2022 Time: 7829-5621 SLP Time Calculation (min) (ACUTE ONLY): 15 min  Assessment / Plan / Recommendation Clinical Impression  Patent seen by SLP for skilled treatment focused on dysphagia goals. Patient's daughter present in room and was feeding patient when SLP arrived. Daughter has experience as a caregiver as she has been providing care to family run group home residents for past 30-some years. She had watered down patient's grits so she could drink via straw sips as patient was not opening her mouth wide enough for spoon bites. SLP observed patient's PO intake with watered-down grits and thin liquids (flavored water) via straw sips. She exhibited mildly prolonged bolus formation with grits and suspected delayed swallow initiation, but no overt s/s aspiration. Daughter is happy that patient is able to have current full liquids diet. She would like to bring in some "baby food" to feed to patient. Daughter will be present to assist patient Mon/Wed/Fri and she has an aid who will come on other days. SLP will continue to follow.    HPI HPI: Pt is an 88 yo admitted 11/13 from SNF for perforated diverticulum. PMH includes: dementia, Parkinson's disease, GERD, HH, stroke, falls, DVT, endometrial cancer, CKD, protein calorie malnutrition with cachexia and failure to thrive. MD started patient on full liquids on 11/22/2022.      SLP Plan  Continue with current plan of care      Recommendations for follow up therapy are one component of a multi-disciplinary discharge planning process, led by the attending physician.  Recommendations may be updated based on patient status, additional functional criteria and insurance authorization.    Recommendations  Diet recommendations: Thin liquid;Dysphagia 1 (puree) Liquids provided via: Straw;Cup Medication Administration:  Crushed with puree Supervision: Trained caregiver to feed patient;Full supervision/cueing for compensatory strategies;Staff to assist with self feeding Compensations: Slow rate;Small sips/bites Postural Changes and/or Swallow Maneuvers: Seated upright 90 degrees                  Oral care QID;Oral care before and after PO   Frequent or constant Supervision/Assistance Dysphagia, unspecified (R13.10)     Continue with current plan of care   Angela Nevin, MA, CCC-SLP Speech Therapy

## 2022-11-23 NOTE — TOC Progression Note (Addendum)
Transition of Care [facility]) - Progression Note    Patient Details  Name: [name] MRN: [id number] Date of Birth: 01/20/1934  Transition of Care [facility]) CM/SW Contact  Otelia Santee, LCSW Phone Number: 11/23/2022, 11:26 AM  Clinical Narrative:    Confirmed pt able to return to [facility] at discharge. Current expected discharge date for tomorrow.  [facility] back to SNF.    Expected Discharge Plan: Skilled Nursing Facility Barriers to Discharge: Continued Medical Work up  Expected Discharge Plan and Services In-house Referral: Clinical Social Work   Post Acute Care Choice: Skilled Nursing Facility Living arrangements for the past 2 months: Skilled Nursing Facility                                       Social Determinants of Health (SDOH) Interventions SDOH Screenings   Food Insecurity: No Food Insecurity (11/20/2022)  Housing: Low Risk  (11/20/2022)  Transportation Needs: No Transportation Needs (11/20/2022)  Utilities: Not At Risk (11/20/2022)  Tobacco Use: Low Risk  (11/18/2022)    Readmission Risk Interventions    11/19/2022    5:50 PM  Readmission Risk Prevention Plan  Transportation Screening Complete  PCP or Specialist Appt within 5-7 Days Complete  Home Care Screening Complete  Medication Review (RN CM) Referral to Pharmacy

## 2022-11-23 NOTE — Progress Notes (Signed)
   11/23/22 0206  Safety Observation   Observer at bedside Yes

## 2022-11-23 NOTE — Progress Notes (Signed)
Initial Nutrition Assessment  DOCUMENTATION CODES:   Severe malnutrition in context of chronic illness, Underweight  INTERVENTION:   -Will order [employer] 1.4 po BID, each supplement provides 455 kcal and 20 grams protein.    -Multivitamin with minerals daily  NUTRITION DIAGNOSIS:   Severe Malnutrition related to chronic illness as evidenced by severe fat depletion, severe muscle depletion.  GOAL:   Patient will meet greater than or equal to 90% of their needs  MONITOR:   PO intake, Supplement acceptance, Labs, Weight trends, I & O's, Skin  REASON FOR ASSESSMENT:   Consult Assessment of nutrition requirement/status  ASSESSMENT:   88 y.o patient presenting from [facility] facility, PMH of dementia, DVT, cancer, anticoagulation, CKD, protein calorie malnutrition with cachexia and failure to thrive present to the hospital with complaints of abdominal pain and agitation.  Was found to have bowel perforation in the large intestine. Not a surgical candidate. Concern for stercoral colitis versus diverticulitis.  Patient in room, daughter at bedside as well as sitter. MD entered room at the end of visit. Pt has been enjoying [name] but needs higher protein option so [name] was recommended as daughter states pt hasn't tolerated dairy in the past. Pt more alert during visit, able to open mouth now so can be spoon fed. Drinking Vitamin water as well as grape juice. Had some grits through a straw this morning. Tends to like fruit flavors. Does not like chocolate flavors.   At facility, daughter was unable to speak on how all meals have been going lately. States pt's best meal is breakfast when she would have eggs with corn flakes in the morning.  Noted MD advanced diet to soft diet. Recommendations remain the same.    Weighed patient on bed scale, pt with blankets on bed. Weight: ~106 lbs. UBW is ~100 lbs per daughter. Pt is underweight.   Medications: Pepcid, Thiamine  Labs  reviewed: Low Phos   NUTRITION - FOCUSED PHYSICAL EXAM:  Flowsheet Row Most Recent Value  Orbital Region Severe depletion  Upper Arm Region Severe depletion  Thoracic and Lumbar Region Severe depletion  Buccal Region Severe depletion  Temple Region Severe depletion  Clavicle Bone Region Severe depletion  Clavicle and Acromion Bone Region Severe depletion  Scapular Bone Region Severe depletion  Dorsal Hand Severe depletion  Patellar Region Severe depletion  Anterior Thigh Region Severe depletion  Posterior Calf Region Severe depletion  Edema (RD Assessment) None  Hair Reviewed  Eyes Reviewed  Mouth Reviewed  Skin Reviewed       Diet Order:   Diet Order             DIET SOFT Room service appropriate? Yes; Fluid consistency: Thin  Diet effective now                   EDUCATION NEEDS:   No education needs have been identified at this time  Skin:  Skin Assessment: Skin Integrity Issues: Skin Integrity Issues:: Stage I Stage I: sacrum  Last BM:  11/17 -type 6  Height:   Ht Readings from Last 1 Encounters:  11/18/22 5\' 7"  (1.702 m)    Weight:   Wt Readings from Last 1 Encounters:  11/18/22 45.4 kg    BMI:  Body mass index is 15.68 kg/m.  Estimated Nutritional Needs:   Kcal:  1600-1800  Protein:  70-85g  Fluid:  1.8L/day  [name], MS, RD, LDN Inpatient Clinical Dietitian Contact information available via Amion

## 2022-11-23 NOTE — Plan of Care (Signed)

## 2022-11-23 NOTE — Evaluation (Signed)
Physical Therapy Evaluation Patient Details Name: [name] MRN: [id number] DOB: 24-Jul-1934 Today's Date: 11/23/2022  History of Present Illness  Pt is 88 yo female admitted on 11/18/22 from [facility] with abdominal pain.  Pt found to have perforated viscus and concern for sigmoid diverticulitis.  General surgery and GI consulted - managing conservatively, not surgical candidate.  Pt with hx including but not limited to dementia, DVT, CA, CKD, Parkinson's, protein calorie malnutrition with cachexia, and FTT  Clinical Impression  Pt admitted with above diagnosis. At baseline, pt is from [facility].  Daughter was present earlier and reports they mostly have pt in w/c and she is a fall risk.  States she could walk some and transfer with RW and assist.  Today, pt requiring light mod A for transfers and to ambulate 15' to and from restroom.  Needs increased time and multimodal cues.   Pt currently with functional limitations due to the deficits listed below (see PT Problem List). Pt will benefit from acute skilled PT to increase their independence and safety with mobility to allow discharge. Family reports plan to return to [facility]. Patient will benefit from continued inpatient follow up therapy, <3 hours/day          If plan is discharge home, recommend the following: A little help with walking and/or transfers;A little help with bathing/dressing/bathroom;Assistance with cooking/housework;Help with stairs or ramp for entrance   Can travel by private vehicle   Yes    Equipment Recommendations None recommended by PT  Recommendations for Other Services       Functional Status Assessment Patient has had a recent decline in their functional status and demonstrates the ability to make significant improvements in function in a reasonable and predictable amount of time.     Precautions / Restrictions Precautions Precautions: Fall      Mobility  Bed Mobility Overal bed  mobility: Needs Assistance Bed Mobility: Supine to Sit, Rolling Rolling: Mod assist   Supine to sit: Mod assist          Transfers Overall transfer level: Needs assistance Equipment used: Rolling walker (2 wheels) Transfers: Sit to/from Stand Sit to Stand: Mod assist           General transfer comment: increased time, assist for hand placement and light mod A to rise    Ambulation/Gait Ambulation/Gait assistance: Mod assist Gait Distance (Feet): 15 Feet (15'x2) Assistive device: Rolling walker (2 wheels) Gait Pattern/deviations: Step-to pattern, Decreased stride length, Shuffle Gait velocity: decreased     General Gait Details: Requiring min A for balance and assist to manage RW for forward momentum. Ambulated to/from bathroom, incontinent of stool  Stairs            Wheelchair Mobility     Tilt Bed    Modified Rankin (Stroke Patients Only)       Balance Overall balance assessment: Needs assistance Sitting-balance support: No upper extremity supported Sitting balance-Leahy Scale: Fair     Standing balance support: Bilateral upper extremity supported Standing balance-Leahy Scale: Poor Standing balance comment: RW and min A                             Pertinent Vitals/Pain Pain Assessment Pain Assessment: No/denies pain    Home Living Family/patient expects to be discharged to:: Skilled nursing facility  Additional Comments: Pt from [facility]    Prior Function Prior Level of Function : Needs assist  Cognitive Assist : Mobility (cognitive);ADLs (cognitive)     Physical Assist : Mobility (physical);ADLs (physical)     Mobility Comments: Daughter reports pt LTC resident at [facility] and is high fall risk.  States she needs some assist with transfers and is mainly in w/c but can walk some with assist and RW ADLs Comments: Daughter reports pt has assist for all ADLs; did not ask about feeding      Extremity/Trunk Assessment   Upper Extremity Assessment Upper Extremity Assessment: Generalized weakness;Difficult to assess due to impaired cognition    Lower Extremity Assessment Lower Extremity Assessment: Generalized weakness;Difficult to assess due to impaired cognition    Cervical / Trunk Assessment Cervical / Trunk Assessment: Kyphotic;Other exceptions Cervical / Trunk Exceptions: cachetic in appearance  Communication      Cognition Arousal: Alert Behavior During Therapy: Flat affect Overall Cognitive Status: History of cognitive impairments - at baseline                                 General Comments: Followed basic commands with increased time and multimodal cues        General Comments General comments (skin integrity, edema, etc.): VSS    Exercises     Assessment/Plan    PT Assessment Patient needs continued PT services  PT Problem List Decreased strength;Decreased range of motion;Decreased activity tolerance;Decreased balance;Decreased mobility;Decreased knowledge of precautions;Decreased safety awareness;Decreased knowledge of use of DME;Decreased cognition;Decreased coordination       PT Treatment Interventions DME instruction;Therapeutic exercise;Gait training;Balance training;Neuromuscular re-education;Functional mobility training;Therapeutic activities;Patient/family education;Wheelchair mobility training    PT Goals (Current goals can be found in the Care Plan section)  Acute Rehab PT Goals Patient Stated Goal: per dtr return to [facility] PT Goal Formulation: With patient/family Time For Goal Achievement: 12/07/22 Potential to Achieve Goals: Fair    Frequency Min 1X/week     Co-evaluation               AM-PAC PT "6 Clicks" Mobility  Outcome Measure Help needed turning from your back to your side while in a flat bed without using bedrails?: A Little Help needed moving from lying on your back to sitting on the side of  a flat bed without using bedrails?: A Lot Help needed moving to and from a bed to a chair (including a wheelchair)?: A Lot Help needed standing up from a chair using your arms (e.g., wheelchair or bedside chair)?: A Lot Help needed to walk in hospital room?: A Lot Help needed climbing 3-5 steps with a railing? : Total 6 Click Score: 12    End of Session Equipment Utilized During Treatment: Gait belt Activity Tolerance: Patient tolerated treatment well Patient left: with chair alarm set;in chair;with call bell/phone within reach Nurse Communication: Mobility status (wrote on white board and notified NT - pt could be assist of 1 pivot with RW) PT Visit Diagnosis: Other abnormalities of gait and mobility (R26.89);Muscle weakness (generalized) (M62.81)    Time: 1610-9604 PT Time Calculation (min) (ACUTE ONLY): 23 min   Charges:   PT Evaluation $PT Eval Low Complexity: 1 Low PT Treatments $Gait Training: 8-22 mins PT General Charges $$ ACUTE PT VISIT: 1 Visit         Anise Salvo, PT Acute Rehab Services  Rehab [phone]   Billey Chang  Lakoda Mcanany 11/23/2022, 4:54 PM

## 2022-11-23 NOTE — Progress Notes (Signed)
[facility] Progress Note Patient: EMILIN MAPES MVH:[id number] DOB: [birth year] DOA: 11/18/2022  DOS: the patient was seen and examined on 11/23/2022  Brief hospital course: [facility], PMH of dementia, DVT, cancer, anticoagulation, CKD, protein calorie malnutrition with cachexia and failure to thrive present to the hospital with complaints of abdominal pain and agitation. Was found to have bowel perforation in the large intestine. Concern for stercoral colitis versus diverticulitis. General surgery and GI were consulted. Management is conservative for now. Per surgery, patient is not a candidate for surgical intervention.  Assessment and Plan: Perforated viscus. Concern for sigmoid diverticulitis. Appreciate surgical evaluation and consultation. Currently patient is not a candidate for surgical intervention. Given that there is some improvement recommended continue current nonoperative conservative measures. For now we will be utilizing IV antibiotics and IV pain medication. Patient had a BM.  No blood in the stool.   Discussed with general surgery on 11/15. Currently on full liquid diet.  Advance to soft diet.  Monitor.  Goals of care conversation. Multiple providers have discussed with family with regards to patient's prognosis. Patient is not a good candidate for surgery. Recommendation is for nonoperative management. Palliative care has been consulted. Daughter [name] have recommended to continue full code for now per my conversation based on 11/15. Unfortunately prognosis is still poor regardless of the outcome of hospitalization given her malnutrition and presentation with bowel perforation.  History of DVT on chronic anticoagulation. Initial presentation is actually secondary to BRBPR. Anticoagulation was on hold.  Now resumed.  Family would like to continue with.  Recommendation is to discontinue it given minimal benefit.  Advanced  dementia with behavioral issue. Continue Zyprexa. On Remeron at home.  This was resumed in the hospital.  Although patient is significantly drowsy therefore we will discontinue that medication.  Would recommend to discontinue that and switch to melatonin.  Parkinson's disease. Associated with dementia. Patient is on carbidopa.  Continue.  Hypophosphatemia. Replaced.  Acute kidney injury. Baseline serum creatinine appears to be around 1.3. On admission serum creatinine was 1.7. Currently improving with hydration.  Hypokalemia. Replacing as well.  Normocytic anemia. Baseline hemoglobin around 10. Hemoglobin on admission was around 9.3-10.5. Currently remaining stable around 8. Reported BRBPR on admission but so far no bleeding reassuringly. Recommend to hold anticoagulation going forward.  Daughter would like to discuss with [facility]  Cachexia. Concern for adult failure to thrive. Underweight. Severe protein calorie malnutrition. Body mass index is 15.68 kg/m.  Likely in the setting of progressive dementia. Prognosis is very poor.  Will consult dietitian. Monitor   Subjective: No nausea no vomiting.  Worked with PT and ambulated 15 feet.  Had a BM.  No diarrhea.  No blood in the stool.  Physical Exam: General: in Mild distress, No Rash Cardiovascular: S1 and S2 Present, No Murmur Respiratory: Good respiratory effort, Bilateral Air entry present. No Crackles, No wheezes Abdomen: Bowel Sound present, No tenderness Extremities: No edema Neuro: Alert and oriented place and person, no new focal deficit  Data Reviewed: I have Reviewed nursing notes, Vitals, and Lab results. Since last encounter, pertinent lab results CBC and CMP   . I have ordered test including CBC and CMP  . I have discussed pt's care plan and test results with palliative care  .   Disposition: Status is: Inpatient Remains inpatient appropriate because: Monitor for diet tolerance overnight  apixaban  (ELIQUIS) tablet 2.5 mg Start: 11/23/22 1000 Place and maintain sequential compression device Start: 11/19/22  0827 apixaban (ELIQUIS) tablet 2.5 mg   Family Communication: Daughter at bedside Level of care: Telemetry   Vitals:   11/22/22 1947 11/22/22 2220 11/23/22 0627 11/23/22 1507  BP: (!) 132/46  (!) 158/73 (!) 121/53  Pulse: 70  74 64  Resp:  18 16 16   Temp: 98.1 F (36.7 C)  98.2 F (36.8 C) 97.7 F (36.5 C)  TempSrc: Oral  Oral   SpO2: 99%  96% 98%  Weight:      Height:         Author: Lynden Oxford, MD 11/23/2022 7:43 PM  Please look on [email or web address] to find out who is on call.

## 2022-11-23 NOTE — [place] FL2 (Signed)
[place] MEDICAID FL2 LEVEL OF CARE FORM     IDENTIFICATION  Patient Name: [name] Birthdate: 1934/04/15 Sex: female Admission Date (Current Location): 11/18/2022  [facility] and [place] Number:  Producer, television/film/video and Address:  [facility],  [address] [place], [place] [address]      Provider Number: [id number]  Attending Physician Name and Address:  Rolly Salter, MD  Relative Name and Phone Number:       Current Level of Care: Hospital Recommended Level of Care: Nursing Facility Prior Approval Number:    Date Approved/Denied:   PASRR Number: [id number]  Discharge Plan: SNF    Current Diagnoses: Patient Active Problem List   Diagnosis Date Noted   Perforated diverticulum 11/18/2022   Intestinal perforation (HCC) 11/18/2022   Aphasia 04/06/2018   Dysphagia 03/11/2016   Irritable bowel syndrome with diarrhea 03/11/2016   History of colonic polyps 05/24/2013   Malignant neoplasm of corpus uteri, except isthmus (HCC) 12/22/2010   Pulmonary embolus (HCC)    DVT (deep venous thrombosis) (HCC)    Chronic diarrhea    Insomnia    Multiple thyroid nodules    Anorexia     Orientation RESPIRATION BLADDER Height & Weight     Self  Normal Incontinent Weight: 100 lb 1.4 oz (45.4 kg) Height:  5\' 7"  (170.2 cm)  BEHAVIORAL SYMPTOMS/MOOD NEUROLOGICAL BOWEL NUTRITION STATUS      Incontinent Diet (See discharge summary)  AMBULATORY STATUS COMMUNICATION OF NEEDS Skin   Extensive Assist Verbally PU Stage and Appropriate Care (Sacrum Stage 1 -  Intact skin with non-blanchable redness of a localized area usually over a bony prominence.) PU Stage 1 Dressing:  (PRN)                     Personal Care Assistance Level of Assistance  Bathing, Feeding, Dressing Bathing Assistance: Maximum assistance Feeding assistance: Limited assistance Dressing Assistance: Maximum assistance     Functional Limitations Info  Sight, Hearing, Speech Sight Info:  Impaired Hearing Info: Adequate Speech Info: Adequate    SPECIAL CARE FACTORS FREQUENCY                       Contractures Contractures Info: Not present    Additional Factors Info  Code Status, Allergies, Psychotropic Code Status Info: FULL Allergies Info: Aspridrox (Aspirin Buf(alhyd-mghyd-cacar)), Codeine, Darvocet (Propoxyphene N-acetaminophen), Esomeprazole Magnesium, Iodine, Morphine And Codeine, Sulfa Antibiotics, Sulfate, Tramadol Psychotropic Info: See MAR         Current Medications (11/23/2022):  This is the current hospital active medication list Current Facility-Administered Medications  Medication Dose Route Frequency Provider Last Rate Last Admin   acetaminophen (TYLENOL) tablet 650 mg  650 mg Oral Q6H PRN Rolly Salter, MD       Or   acetaminophen (TYLENOL) suppository 650 mg  650 mg Rectal Q4H PRN Rolly Salter, MD       amoxicillin-clavulanate (AUGMENTIN) 500-125 MG per tablet 1 tablet  1 tablet Oral BID Rolly Salter, MD       apixaban Everlene Balls) tablet 2.5 mg  2.5 mg Oral BID Rolly Salter, MD   2.5 mg at 11/23/22 1034   carbidopa-levodopa (SINEMET IR) 25-100 MG per tablet immediate release 1 tablet  1 tablet Oral QID Rolly Salter, MD   1 tablet at 11/23/22 1034   Chlorhexidine Gluconate Cloth 2 % PADS 6 each  6 each Topical Q2200 Rolly Salter, MD  6 each at 11/22/22 2220   famotidine (PEPCID) tablet 20 mg  20 mg Oral Daily Rolly Salter, MD   20 mg at 11/23/22 1034   feeding supplement (KATE FARMS STANDARD 1.4) liquid 325 mL  325 mL Oral BID BM Rolly Salter, MD       hydrALAZINE (APRESOLINE) injection 10 mg  10 mg Intravenous Q4H PRN Rolly Salter, MD       lip balm (CARMEX) ointment   Topical PRN Ulice Bold, NP       melatonin tablet 5 mg  5 mg Oral QHS PRN Rolly Salter, MD       multivitamin with minerals tablet 1 tablet  1 tablet Oral Daily Rolly Salter, MD       OLANZapine [facility]) tablet 10 mg  10 mg Oral QHS  Rolly Salter, MD   10 mg at 11/22/22 2205   ondansetron (ZOFRAN) injection 4 mg  4 mg Intravenous Q6H PRN Rolly Salter, MD       Oral care mouth rinse  15 mL Mouth Rinse 4 times per day Rolly Salter, MD   15 mL at 11/23/22 0900   Oral care mouth rinse  15 mL Mouth Rinse PRN Rolly Salter, MD       pantoprazole (PROTONIX) EC tablet 40 mg  40 mg Oral BID Hessie Knows, RPH   40 mg at 11/23/22 1034   thiamine (VITAMIN B1) tablet 100 mg  100 mg Oral Daily Hessie Knows, RPH   100 mg at 11/23/22 1034     Discharge Medications: Please see discharge summary for a list of discharge medications.  Relevant Imaging Results:  Relevant Lab Results:   Additional Information SSN: [id number]  Otelia Santee, LCSW

## 2022-11-23 NOTE — Progress Notes (Signed)
     Referral previously received for [name] for goals of care discussion. Chart reviewed and updates received from RN. See last PMT note dated 11/20/2022.  Spoke with the hospitalist Dr. Lynden Oxford.  Family is planning to continue full code, full scope.  They are anticipating discharge to skilled nursing facility tomorrow.  At this time goals are clear. We will sign off for now and discontinue consult order. Please contact us and enter a new consult order for any new palliative care needs.  Thank you for your referral and allowing PMT to assist in [name]'s care.   Wynne Dust, NP Palliative Medicine Team Phone: [phone]  NO CHARGE

## 2022-11-24 DIAGNOSIS — K5780 Diverticulitis of intestine, part unspecified, with perforation and abscess without bleeding: Secondary | ICD-10-CM | POA: Diagnosis not present

## 2022-11-24 MED ORDER — KATE FARMS STANDARD 1.4 PO LIQD: 325.0000 mL | Freq: Two times a day (BID) | ORAL | 0 refills | Status: DC

## 2022-11-24 MED ORDER — ADULT MULTIVITAMIN W/MINERALS CH: 1.0000 | ORAL_TABLET | Freq: Every day | ORAL | 0 refills | Status: AC

## 2022-11-24 MED ORDER — MIRTAZAPINE 15 MG PO TABS: 7.5000 mg | ORAL_TABLET | Freq: Every day | ORAL | Status: DC

## 2022-11-24 MED ORDER — AMOXICILLIN-POT CLAVULANATE 500-125 MG PO TABS: 1.0000 | ORAL_TABLET | Freq: Two times a day (BID) | ORAL | 0 refills | Status: AC

## 2022-11-24 MED ORDER — PANTOPRAZOLE SODIUM 40 MG PO TBEC: 40.0000 mg | DELAYED_RELEASE_TABLET | Freq: Every day | ORAL | 0 refills | Status: DC

## 2022-11-24 MED ORDER — MIRTAZAPINE 7.5 MG PO TABS: 7.5000 mg | ORAL_TABLET | Freq: Every day | ORAL | 0 refills | Status: AC

## 2022-11-24 MED ORDER — VITAMIN B-1 100 MG PO TABS: 100.0000 mg | ORAL_TABLET | Freq: Every day | ORAL | 0 refills | Status: AC

## 2022-11-24 NOTE — Discharge Summary (Signed)
Physician Discharge Summary   Patient: [name] MRN: [id number] DOB: 05-04-1934  Admit date:     11/18/2022  Discharge date: 11/24/22  Discharge Physician: Lynden Oxford  PCP: [facility]  Recommendations at discharge: Follow up with PCP in 1 week  Soft diet only. Plenty of hydration. Avoid constipation.    Follow-up [employer], [facility]. Schedule an appointment as soon as possible for a visit in 1 week(s).   Contact information: [address] [place] [address] [phone]                Discharge Diagnoses: Principal Problem:   Perforated diverticulum Active Problems:   Intestinal perforation (HCC)   Protein-calorie malnutrition, severe  Brief hospital course: [facility] from [facility], PMH of dementia, DVT, cancer, anticoagulation, CKD, protein calorie malnutrition with cachexia and failure to thrive present to the hospital with complaints of abdominal pain and agitation. Was found to have bowel perforation in the large intestine. Concern for stercoral colitis versus diverticulitis. General surgery and GI were consulted. Per surgery, patient is not a candidate for surgical intervention. Treated conservatively as a diverticulitis presentation per General surgery recommendation  with improvement in oral intake.   Assessment and Plan: Perforated viscus. Concern for sigmoid diverticulitis. Appreciate surgical evaluation and consultation. Currently patient is not a candidate for surgical intervention. Given that there is some improvement recommended continue current nonoperative conservative measures. For now we will be utilizing IV antibiotics and IV pain medication. Patient had a BM.  No blood in the stool.   Discussed with general surgery on 11/15. Currently tolerating soft diet.   Goals of care conversation. Multiple providers have discussed with family  with regards to patient's prognosis. Patient is not a good candidate for surgery. Recommendation is for nonoperative management. Palliative care has been consulted. Daughter [name] have recommended to continue full code for now per my conversation based on 11/15. Unfortunately prognosis is still poor regardless of the outcome of hospitalization given her malnutrition and presentation with bowel perforation.  History of DVT on chronic anticoagulation. Initial presentation is actually secondary to BRBPR. Anticoagulation was on hold.  Now resumed.  Family would like to continue with.  Recommendation is to discontinue it given minimal benefit.  Advanced dementia with behavioral issue. Continue Zyprexa. On Remeron at home.  This was resumed in the hospital.  Although patient is significantly drowsy therefore we will discontinue that medication.  Would recommend to discontinue that and switch to melatonin.  Insomnia  Excessively sleepy with remeron 30, and unable to sleep without it.  Will reduce the dose to 7.5 mg   Parkinson's disease. Associated with dementia. Patient is on carbidopa.  Continue.  Hypophosphatemia. Replaced.  Acute kidney injury. Baseline serum creatinine appears to be around 1.3. On admission serum creatinine was 1.7. Currently improving with hydration.  Hypokalemia. Replacing as well.  Normocytic anemia. Baseline hemoglobin around 10. Hemoglobin on admission was around 9.3-10.5. Currently remaining stable around 8. Reported BRBPR on admission but so far no bleeding reassuringly. Recommend to hold anticoagulation going forward.  Daughter would like to discuss with [facility]  Cachexia. Concern for adult failure to thrive. Underweight. Severe protein calorie malnutrition. Body mass index is 15.68 kg/m.  Likely in the setting of progressive dementia. Prognosis is very poor.  Will consult dietitian. Monitor  Consultants:  General surgery  Gastroenterology   Palliative care   Procedures performed:  none  DISCHARGE MEDICATION: Allergies as of 11/24/2022       Reactions   Aspridrox [aspirin Buf(alhyd-mghyd-cacar)] Other (See Comments)   Unknown Not documented on MAR   Codeine Other (See Comments)   Unknown   Darvocet [propoxyphene N-acetaminophen] Other (See Comments)   Unknown Not documented on MAR   Esomeprazole Magnesium Other (See Comments)   Unknown Not documented on MAR   Iodine Other (See Comments)   unknown   Morphine And Codeine Other (See Comments)   unknown   Sulfa Antibiotics Other (See Comments)   Unknown Not documented on MAR   Sulfate Other (See Comments)   Unknown Not documented on MAR   Tramadol Other (See Comments)   Unknown Not documented on MAR        Medication List     STOP taking these medications    cholestyramine 4 g packet Commonly known as: Questran   loperamide 2 MG tablet Commonly known as: IMODIUM A-D       TAKE these medications    acetaminophen 650 MG CR tablet Commonly known as: TYLENOL Take 1,300 mg by mouth every 8 (eight) hours as needed for pain.   amoxicillin-clavulanate 500-125 MG tablet Commonly known as: AUGMENTIN Take 1 tablet by mouth 2 (two) times daily for 4 days.   calcium carbonate 750 MG chewable tablet Commonly known as: TUMS EX Chew 750 mg by mouth 2 (two) times daily.   carbidopa-levodopa 25-100 MG tablet Commonly known as: SINEMET IR Take 1 tablet by mouth 4 (four) times daily. 1000, 1400, 1800, 2200   cholecalciferol 1000 units tablet Commonly known as: VITAMIN D Take 1,000 Units by mouth daily.   Eliquis 2.5 MG Tabs tablet Generic drug: apixaban Take 2.5 mg by mouth 2 (two) times daily.   escitalopram 5 MG tablet Commonly known as: LEXAPRO Take 1 tablet (5 mg total) by mouth daily. What changed: how much to take   famotidine 20 MG tablet Commonly known as: PEPCID Take 20 mg by mouth daily.   feeding supplement (KATE FARMS STANDARD  1.4) Liqd liquid Take 325 mLs by mouth 2 (two) times daily between meals.   magnesium gluconate 500 MG tablet Commonly known as: MAGONATE Take 500 mg by mouth 2 (two) times daily.   MINERIN CREME EX Apply 1 Application topically daily.   mirtazapine 7.5 MG tablet Commonly known as: REMERON Take 1 tablet (7.5 mg total) by mouth at bedtime. What changed:  medication strength how much to take   multivitamin with minerals Tabs tablet Take 1 tablet by mouth daily.   NON FORMULARY Take 120 mLs by mouth 2 (two) times daily. Med Pass   NONFORMULARY OR COMPOUNDED ITEM Apply 1 Application topically daily as needed (Severe anxiety.). Ativan Gel: Apply (1mg /ml) 1mL once every 24 hours as needed.   OLANZapine 10 MG tablet Commonly known as: ZYPREXA Take 10 mg by mouth at bedtime.   pantoprazole 40 MG tablet Commonly known as: PROTONIX Take 1 tablet (40 mg total) by mouth daily for 14 days.   thiamine 100 MG tablet Commonly known as: Vitamin B-1 Take 1 tablet (100 mg total) by mouth daily.   Vitamin B 12 100 MCG Lozg Take 1,000 mcg by mouth daily.               Discharge Care Instructions  (From admission, onward)           Start     Ordered   11/24/22 0000  Discharge wound care:  Comments: Foam dressing on sacrum   11/24/22 1051           Disposition: ALF/ILF Diet recommendation: low fiber soft diet  Discharge Exam: Vitals:   11/23/22 0627 11/23/22 1507 11/23/22 1945 11/24/22 0432  BP: (!) 158/73 (!) 121/53 (!) 116/57 133/78  Pulse: 74 64 66 63  Resp: 16 16 18 18   Temp: 98.2 F (36.8 C) 97.7 F (36.5 C) 98.6 F (37 C)   TempSrc: Oral  Oral   SpO2: 96% 98% 98% 100%  Weight:      Height:       General: Appear in mild distress; no visible Abnormal Neck Mass Or lumps, Conjunctiva normal Cardiovascular: S1 and S2 Present, no Murmur, Respiratory: good respiratory effort, Bilateral Air entry present and CTA, no Crackles, no wheezes Abdomen:  Bowel Sound present, Non tender  Extremities: no Pedal edema Neurology: alert and oriented to self  Filed Weights   11/18/22 1217  Weight: 45.4 kg   Condition at discharge: stable  The results of significant diagnostics from this hospitalization (including imaging, microbiology, ancillary and laboratory) are listed below for reference.   Imaging Studies: DG Chest Port 1 View  Result Date: 11/18/2022 CLINICAL DATA:  Altered mental status. EXAM: PORTABLE CHEST 1 VIEW COMPARISON:  Chest radiograph dated October 10, 2022. FINDINGS: Patient is rotated to the right. The heart size and mediastinal contours are within normal limits. No focal consolidation, pleural effusion, or pneumothorax. No acute osseous abnormality. IMPRESSION: No acute cardiopulmonary findings. Electronically Signed   By: Hart Robinsons M.D.   On: 11/18/2022 15:58   CT ABDOMEN PELVIS WO CONTRAST  Result Date: 11/18/2022 CLINICAL DATA:  Left lower quadrant abdominal pain. EXAM: CT ABDOMEN AND PELVIS WITHOUT CONTRAST TECHNIQUE: Multidetector CT imaging of the abdomen and pelvis was performed following the standard protocol without IV contrast. RADIATION DOSE REDUCTION: This exam was performed according to the departmental dose-optimization program which includes automated exposure control, adjustment of the mA and/or kV according to patient size and/or use of iterative reconstruction technique. COMPARISON:  April 20, 2021. FINDINGS: Lower chest: Minimal bibasilar subsegmental atelectasis is noted. Hepatobiliary: Status post cholecystectomy. No biliary dilatation. Multiple calcified granulomas are noted in the patent parenchyma which are unchanged. Pancreas: Unremarkable. No pancreatic ductal dilatation or surrounding inflammatory changes. Spleen: Normal in size without focal abnormality. Adrenals/Urinary Tract: Evaluation is limited due to extensive amount of air seen in the retroperitoneum superiorly which extends into the  mediastinum. No hydronephrosis or renal obstruction is noted. Urinary bladder is unremarkable. Stomach/Bowel: Stomach is unremarkable. Mildly dilated small bowel loops are noted in the pelvis of uncertain etiology. Stool is noted in the sigmoid colon. There is moderate wall thickening of the sigmoid colon and a large amount of free air is noted adjacent to it concerning for perforation. This may account for the free air seen in the more superior portion of the retroperitoneum. Vascular/Lymphatic: Aortic atherosclerosis. No enlarged abdominal or pelvic lymph nodes. IVC filter is noted in infrarenal position. Reproductive: Status post hysterectomy. No adnexal masses. Other: No hernia is noted. Musculoskeletal: No acute or significant osseous findings. IMPRESSION: Moderate wall thickening of the sigmoid colon is noted with a large amount of free air adjacent to it concerning for perforation, potentially due to diverticulitis or colitis. Extensive amount of emphysema is noted in the retroperitoneal region most likely secondary to this, which extends into the visualized portion of the mediastinum. Mildly dilated small bowel loops are noted most likely representing ileus secondary to inflammation.  Critical Value/emergent results were called by telephone at the time of interpretation on 11/18/2022 at 3:49 pm to provider [facility] , who verbally acknowledged these results. Electronically Signed   By: Lupita Raider M.D.   On: 11/18/2022 15:49   CT Head Wo Contrast  Result Date: 11/18/2022 CLINICAL DATA:  Head trauma.  Altered mental status. EXAM: CT HEAD WITHOUT CONTRAST TECHNIQUE: Contiguous axial images were obtained from the base of the skull through the vertex without intravenous contrast. RADIATION DOSE REDUCTION: This exam was performed according to the departmental dose-optimization program which includes automated exposure control, adjustment of the mA and/or kV according to patient size and/or use of  iterative reconstruction technique. COMPARISON:  10/11/2022 FINDINGS: Brain: Chronic brain atrophy. No sign of acute infarction, mass lesion, hemorrhage, hydrocephalus or extra-axial collection. Vascular: There is atherosclerotic calcification of the major vessels at the base of the brain. Skull: Negative Sinuses/Orbits: Clear/normal Other: None IMPRESSION: No acute or traumatic finding. Chronic brain atrophy. Electronically Signed   By: Paulina Fusi M.D.   On: 11/18/2022 12:32    Microbiology: Results for orders placed or performed during the hospital encounter of 11/18/22  Blood Culture (routine x 2)     Status: None   Collection Time: 11/18/22 11:38 AM   Specimen: BLOOD  Result Value Ref Range Status   Specimen Description   Final    BLOOD LEFT ANTECUBITAL Performed at [facility], [address] [place], [place] [address]    Special Requests   Final    BOTTLES DRAWN AEROBIC AND ANAEROBIC Blood Culture adequate volume Performed at [facility], [address], [place], [place] [address]    Culture   Final    NO GROWTH 5 DAYS Performed at [facility], [address] [place], [place] [address]    Report Status 11/23/2022 FINAL  Final  Blood Culture (routine x 2)     Status: None   Collection Time: 11/18/22 11:50 AM   Specimen: BLOOD  Result Value Ref Range Status   Specimen Description   Final    BLOOD RIGHT ANTECUBITAL Performed at [facility], [address] [place], [place] [address]    Special Requests   Final    BOTTLES DRAWN AEROBIC AND ANAEROBIC Blood Culture adequate volume Performed at [facility], [address] [place], [place] [address]    Culture   Final    NO GROWTH 5 DAYS Performed at [facility], [address] [place], [place] [address]    Report Status 11/23/2022 FINAL  Final  Resp panel by RT-PCR (RSV, Flu A&B, Covid) Anterior Nasal Swab     Status: None    Collection Time: 11/18/22 12:02 PM   Specimen: Anterior Nasal Swab  Result Value Ref Range Status   SARS Coronavirus 2 by RT PCR NEGATIVE NEGATIVE Final    Comment: (NOTE) SARS-CoV-2 target nucleic acids are NOT DETECTED.  The SARS-CoV-2 RNA is generally detectable in upper respiratory specimens during the acute phase of infection. The lowest concentration of SARS-CoV-2 viral copies this assay can detect is 138 copies/mL. A negative result does not preclude SARS-Cov-2 infection and should not be used as the sole basis for treatment or other patient management decisions. A negative result may occur with  improper specimen collection/handling, submission of specimen other than nasopharyngeal swab, presence of viral mutation(s) within the areas targeted by this assay, and inadequate number of viral copies(<138 copies/mL). A negative  result must be combined with clinical observations, patient history, and epidemiological information. The expected result is Negative.  Fact Sheet for Patients:  BloggerCourse.com  Fact Sheet for Healthcare Providers:  SeriousBroker.it  This test is no t yet approved or cleared by the [place] FDA and  has been authorized for detection and/or diagnosis of SARS-CoV-2 by FDA under an Emergency Use Authorization (EUA). This EUA will remain  in effect (meaning this test can be used) for the duration of the COVID-19 declaration under Section 564(b)(1) of the Act, 21 U.S.C.section 360bbb-3(b)(1), unless the authorization is terminated  or revoked sooner.       Influenza A by PCR NEGATIVE NEGATIVE Final   Influenza B by PCR NEGATIVE NEGATIVE Final    Comment: (NOTE) The Xpert Xpress SARS-CoV-2/FLU/RSV plus assay is intended as an aid in the diagnosis of influenza from Nasopharyngeal swab specimens and should not be used as a sole basis for treatment. Nasal washings and aspirates are unacceptable for  Xpert Xpress SARS-CoV-2/FLU/RSV testing.  Fact Sheet for Patients: BloggerCourse.com  Fact Sheet for Healthcare Providers: SeriousBroker.it  This test is not yet approved or cleared by the [place] FDA and has been authorized for detection and/or diagnosis of SARS-CoV-2 by FDA under an Emergency Use Authorization (EUA). This EUA will remain in effect (meaning this test can be used) for the duration of the COVID-19 declaration under Section 564(b)(1) of the Act, 21 U.S.C. section 360bbb-3(b)(1), unless the authorization is terminated or revoked.     Resp Syncytial Virus by PCR NEGATIVE NEGATIVE Final    Comment: (NOTE) Fact Sheet for Patients: BloggerCourse.com  Fact Sheet for Healthcare Providers: SeriousBroker.it  This test is not yet approved or cleared by the [place] FDA and has been authorized for detection and/or diagnosis of SARS-CoV-2 by FDA under an Emergency Use Authorization (EUA). This EUA will remain in effect (meaning this test can be used) for the duration of the COVID-19 declaration under Section 564(b)(1) of the Act, 21 U.S.C. section 360bbb-3(b)(1), unless the authorization is terminated or revoked.  Performed at [facility], [address], [place], [place] [address]   MRSA Next Gen by PCR, Nasal     Status: None   Collection Time: 11/19/22  4:32 PM   Specimen: Nasal Mucosa; Nasal Swab  Result Value Ref Range Status   MRSA by PCR Next Gen NOT DETECTED NOT DETECTED Final    Comment: (NOTE) The GeneXpert MRSA Assay (FDA approved for NASAL specimens only), is one component of a comprehensive MRSA colonization surveillance program. It is not intended to diagnose MRSA infection nor to guide or monitor treatment for MRSA infections. Test performance is not FDA approved in patients less than 98 years old. Performed at [facility], [address], [place], [place] [address]    Labs: CBC: Recent Labs  Lab 11/18/22 1140 11/18/22 1205 11/19/22 0416 11/20/22 0313 11/21/22 0702 11/22/22 1146 11/23/22 0746  WBC 17.8*  --  13.3* 11.9* 7.5 6.3 7.7  NEUTROABS 15.8*  --   --   --   --   --   --   HGB 9.3*   < > 8.3* 8.6* 8.1* 8.2* 8.5*  HCT 29.7*   < > 26.7* 26.9* 26.6* 25.6* 27.1*  MCV 98.7  --  98.2 96.8 97.1 95.2 97.8  PLT 196  --  181 215 239 251 326   < > = values in this interval not displayed.   Basic Metabolic Panel: Recent  Labs  Lab 11/19/22 0416 11/20/22 0313 11/21/22 0702 11/22/22 0548 11/23/22 0746  NA 139 139 137 144 142  K 3.9 3.5 3.2* 3.2* 3.9  CL 106 107 106 110 112*  CO2 25 23 26 24 23   GLUCOSE 86 90 100* 78 92  BUN 29* 25* 11 7* 8  CREATININE 1.30* 1.31* 1.14* 1.13* 1.09*  CALCIUM 9.2 9.2 8.8* 9.1 8.8*  MG 2.2 2.0 1.9 1.9 1.7  PHOS 1.9* 2.6 2.1* 3.4 2.3*   Liver Function Tests: Recent Labs  Lab 11/18/22 1140  AST 25  ALT 17  ALKPHOS 84  BILITOT 2.2*  PROT 6.6  ALBUMIN 3.0*   CBG: No results for input(s): "GLUCAP" in the last 168 hours.  Discharge time spent: greater than 30 minutes.  Author: Lynden Oxford, MD  [facility]

## 2022-11-24 NOTE — TOC Transition Note (Signed)
Transition of Care [facility]) - CM/SW Discharge Note   Patient Details  Name: [name] MRN: [id number] Date of Birth: 10/19/1934  Transition of Care [facility] CM/SW Contact:  Otelia Santee, LCSW Phone Number: 11/24/2022, 12:17 PM   Clinical Narrative:    Pt to return to [facility]. Pt will be going to room 213. RN to call report to [phone]. Left VM with pt's daughter to inform of discharge. Spoke with Heather at [facility] who will be sending a wheelchair and wheelchair Zenaida Niece to pick pt up at 1pm to transport back to [facility]. DC packet placed at RN station.    Final next level of care: Long Term Nursing Home Barriers to Discharge: Barriers Resolved   Patient Goals and CMS Choice CMS Medicare.gov Compare Post Acute Care list provided to:: Patient Represenative (must comment) (Patient's daughter) Choice offered to / list presented to : Adult Children  Discharge Placement                Patient chooses bed at: [facility] Patient to be transferred to facility by: [facility] Name of family member notified: Patient Patient and family notified of of transfer: 11/24/22  Discharge Plan and Services Additional resources added to the After Visit Summary for   In-house Referral: Clinical Social Work   Post Acute Care Choice: Skilled Nursing Facility          DME Arranged: N/A DME Agency: NA                  Social Determinants of Health (SDOH) Interventions SDOH Screenings   Food Insecurity: No Food Insecurity (11/20/2022)  Housing: Low Risk  (11/20/2022)  Transportation Needs: No Transportation Needs (11/20/2022)  Utilities: Not At Risk (11/20/2022)  Tobacco Use: Low Risk  (11/18/2022)     Readmission Risk Interventions    11/19/2022    5:50 PM  Readmission Risk Prevention Plan  Transportation Screening Complete  PCP or Specialist Appt within 5-7 Days Complete  Home Care Screening Complete  Medication Review (RN CM) Referral to  Pharmacy

## 2022-12-21 ENCOUNTER — Emergency Department (HOSPITAL_COMMUNITY)
Admission: EM | Admit: 2022-12-21 | Discharge: 2022-12-22 | Disposition: A | Discharge status: Home / Self Care | Payer: Medicare (Managed Care) | Attending: Emergency Medicine | Admitting: Emergency Medicine

## 2022-12-21 ENCOUNTER — Encounter (HOSPITAL_COMMUNITY): Payer: Self-pay

## 2022-12-21 ENCOUNTER — Emergency Department (HOSPITAL_COMMUNITY): Payer: Medicare (Managed Care)

## 2022-12-21 DIAGNOSIS — G20A1 Parkinson's disease without dyskinesia, without mention of fluctuations: Secondary | ICD-10-CM | POA: Insufficient documentation

## 2022-12-21 DIAGNOSIS — S161XXA Strain of muscle, fascia and tendon at neck level, initial encounter: Secondary | ICD-10-CM | POA: Diagnosis not present

## 2022-12-21 DIAGNOSIS — N189 Chronic kidney disease, unspecified: Secondary | ICD-10-CM | POA: Insufficient documentation

## 2022-12-21 DIAGNOSIS — Y92129 Unspecified place in nursing home as the place of occurrence of the external cause: Secondary | ICD-10-CM | POA: Insufficient documentation

## 2022-12-21 DIAGNOSIS — S0083XA Contusion of other part of head, initial encounter: Secondary | ICD-10-CM | POA: Diagnosis not present

## 2022-12-21 DIAGNOSIS — S0990XA Unspecified injury of head, initial encounter: Secondary | ICD-10-CM

## 2022-12-21 DIAGNOSIS — Z7901 Long term (current) use of anticoagulants: Secondary | ICD-10-CM | POA: Insufficient documentation

## 2022-12-21 DIAGNOSIS — S80212A Abrasion, left knee, initial encounter: Secondary | ICD-10-CM | POA: Insufficient documentation

## 2022-12-21 DIAGNOSIS — F0390 Unspecified dementia without behavioral disturbance: Secondary | ICD-10-CM | POA: Diagnosis not present

## 2022-12-21 DIAGNOSIS — S199XXA Unspecified injury of neck, initial encounter: Secondary | ICD-10-CM | POA: Diagnosis present

## 2022-12-21 DIAGNOSIS — W19XXXA Unspecified fall, initial encounter: Secondary | ICD-10-CM | POA: Diagnosis not present

## 2022-12-21 NOTE — ED Triage Notes (Signed)
Pt had an unwitnessed fall at [facility]. Patient with history of parkinsons, patient GCS 14 at baseline and has not deviated from that. Small goose egg on left forehead.

## 2022-12-21 NOTE — ED Notes (Signed)
C-collar placed upon arrival

## 2022-12-21 NOTE — ED Notes (Signed)
Trauma Response Nurse Documentation   [name] is a 88 y.o. female arriving to [facility] ED via [facility]  On Eliquis (apixaban) daily. Trauma was activated as a Level 2 by [name] based on the following trauma criteria Elderly patients > 65 with head trauma on anti-coagulation (excluding ASA).  Patient cleared for CT by Dr. Bebe [name]. Pt transported to CT with trauma response nurse present to monitor. RN remained with the patient throughout their absence from the department for clinical observation.   GCS 15.  Trauma MD Arrival Time: N/A.  History   Past Medical History:  Diagnosis Date   Anorexia    Anxiety    Blood transfusion without reported diagnosis    1970's   Chronic diarrhea    CKD (chronic kidney disease)    Clotting disorder (HCC)    Dementia (HCC)    Depression    DVT (deep venous thrombosis) (HCC)    Endometrial ca (HCC)    GERD (gastroesophageal reflux disease)    Hiatal hernia    History of D&C    History of sinus surgery    Hypokalemia    IBS (irritable bowel syndrome)    Insomnia    Insomnia    Multiple thyroid nodules 12/2005   b/l nodules thyroid    Neuropathy    Parkinson's disease (HCC)    Pulmonary embolus (HCC)    Radiation Jan.3-Feb.6,2008   External Beam to pelvis   Radiation Feb.13,20 and 27/2008   Intracavitary brachytherapy   Seizure disorder (HCC)    Stroke (HCC)    R brain Left hemiparesis mild     Past Surgical History:  Procedure Laterality Date   ABDOMINAL HYSTERECTOMY  08/11/05   tah,bso,pelvic lymphadenectomy   ankle fracture repair Left    antroscopy     maxillary   CHOLECYSTECTOMY     COLONOSCOPY  2012   benign colonic polyps   ESOPHAGOGASTRODUODENOSCOPY  2012   neg   IVC Filter  2007   s/p DVT and P.E>   LAPAROSCOPIC ASSISTED RADICAL VAGINAL HYSTERECTOMY W/ NODE BIOPSY  08/11/05   LYMPHADENECTOMY     rotator cuff repair Right        Initial Focused Assessment (If applicable, or please see trauma  documentation): Airway-- intact, no visible obstruction Breathing-- spontaneous, unlabored Circulation-- no obvious bleeding noted  CT's Completed:   CT Head and CT C-Spine   Interventions:  See event summary  Plan for disposition:  Discharge home   Consults completed:  none at 0000.  Event Summary: Patient brought in by [facility] from rehab facility. Patient with fall this evening, hematoma to the right side of her head. On arrival GCS 14 which is baseline for the patient. Patient transferred from EMS stretcher to hospital stretcher. Patient placed in a [place]. Vital signs assessed.   MTP Summary (If applicable):  N/A  Bedside handoff with ED RN [name].    [name]  Trauma Response RN  Please call TRN at [phone] for further assistance.

## 2022-12-21 NOTE — ED Provider Notes (Signed)
[place] EMERGENCY DEPARTMENT AT [facility] Provider Note   CSN: [id number] Arrival date & time: 12/21/22  2319     History  Chief Complaint  Patient presents with   Fall   Level 5 caveat due to altered mental status   [name] is a 88 y.o. female.  The history is provided by the EMS personnel and the patient. The history is limited by the condition of the patient.  Patient with extensive history including dementia, VTE on Eliquis presents after a fall.  Reportedly patient had a fall at the nursing facility and was found with a hematoma to her forehead. She was brought in as a level 2 trauma due to evidence of head trauma in the setting of anticoagulation use No other signs of trauma per EMS Patient is minimally verbal during exam    Past Medical History:  Diagnosis Date   Anorexia    Anxiety    Blood transfusion without reported diagnosis    1970's   Chronic diarrhea    CKD (chronic kidney disease)    Clotting disorder (HCC)    Dementia (HCC)    Depression    DVT (deep venous thrombosis) (HCC)    Endometrial ca (HCC)    GERD (gastroesophageal reflux disease)    Hiatal hernia    History of D&C    History of sinus surgery    Hypokalemia    IBS (irritable bowel syndrome)    Insomnia    Insomnia    Multiple thyroid nodules 12/2005   b/l nodules thyroid    Neuropathy    Parkinson's disease (HCC)    Pulmonary embolus (HCC)    Radiation Jan.3-Feb.6,2008   External Beam to pelvis   Radiation Feb.13,20 and 27/2008   Intracavitary brachytherapy   Seizure disorder (HCC)    Stroke (HCC)    R brain Left hemiparesis mild    Home Medications Prior to Admission medications   Medication Sig Start Date End Date Taking? Authorizing Provider  apixaban (ELIQUIS) 2.5 MG TABS tablet Take 2.5 mg by mouth 2 (two) times daily.   Yes [provider]  acetaminophen (TYLENOL) 650 MG CR tablet Take 1,300 mg by mouth every 8 (eight) hours as needed for  pain.    [provider]  calcium carbonate (TUMS EX) 750 MG chewable tablet Chew 750 mg by mouth 2 (two) times daily.    [provider]  carbidopa-levodopa (SINEMET IR) 25-100 MG tablet Take 1 tablet by mouth 4 (four) times daily. 1000, 1400, 1800, 2200    [provider]  cholecalciferol (VITAMIN D) 1000 UNITS tablet Take 1,000 Units by mouth daily.    [provider]  Cyanocobalamin (VITAMIN B 12) 100 MCG LOZG Take 1,000 mcg by mouth daily.    [provider]  escitalopram (LEXAPRO) 5 MG tablet Take 1 tablet (5 mg total) by mouth daily. Patient taking differently: Take 10 mg by mouth daily. 04/08/18   Ali Lowe, MD  famotidine (PEPCID) 20 MG tablet Take 20 mg by mouth daily.    [provider]  magnesium gluconate (MAGONATE) 500 MG tablet Take 500 mg by mouth 2 (two) times daily.    [provider]  mirtazapine (REMERON) 7.5 MG tablet Take 1 tablet (7.5 mg total) by mouth at bedtime. 11/24/22   Rolly Salter, MD  Multiple Vitamin (MULTIVITAMIN WITH MINERALS) TABS tablet Take 1 tablet by mouth daily. 11/24/22   Rolly Salter, MD  NON [place] [employer]  Take 120 mLs by mouth 2 (two) times daily. Med Pass    [provider]  NONFORMULARY OR COMPOUNDED ITEM Apply 1 Application topically daily as needed (Severe anxiety.). Ativan Gel: Apply (1mg /ml) 1mL once every 24 hours as needed.    [provider]  Nutritional Supplements (FEEDING SUPPLEMENT, KATE FARMS STANDARD 1.4,) LIQD liquid Take 325 mLs by mouth 2 (two) times daily between meals. 11/24/22   Rolly Salter, MD  OLANZapine (ZYPREXA) 10 MG tablet Take 10 mg by mouth at bedtime.    [provider]  pantoprazole (PROTONIX) 40 MG tablet Take 1 tablet (40 mg total) by mouth daily for 14 days. 11/24/22 12/08/22  Rolly Salter, MD  Skin Protectants, Misc. (MINERIN CREME EX) Apply 1 Application topically daily.    [provider]  thiamine (VITAMIN B-1)  100 MG tablet Take 1 tablet (100 mg total) by mouth daily. 11/24/22   Rolly Salter, MD      Allergies    Aspridrox Eilene Ghazi buf(alhyd-mghyd-cacar)], Codeine, Darvocet [propoxyphene n-acetaminophen], Esomeprazole magnesium, Iodine, Morphine and codeine, Sulfa antibiotics, Sulfate, and Tramadol    Review of Systems   Review of Systems  Unable to perform ROS: Mental status change    Physical Exam Updated Vital Signs BP 131/79   Pulse 73   Temp 97.8 F (36.6 C) (Oral)   Resp 16   SpO2 100%  Physical Exam CONSTITUTIONAL: Elderly and frail HEAD: Hematoma noted to left forehead, no other signs of trauma EYES: EOMI/PERRL ENMT: Mucous membranes moist, no visible trauma SPINE/BACK: Kyphotic spine No bruising/crepitance/stepoffs noted to spine CV: S1/S2 noted, no murmurs/rubs/gallops noted LUNGS: Lungs are clear to auscultation bilaterally, no apparent distress Chest-no bruising or tenderness ABDOMEN: soft, nontender NEURO: Pt is awake/alert, moves all extremitiesx4.  No facial droop.  Patient is slow to respond but moves all extremities with command.  Patient whispers during exam EXTREMITIES: pulses normal/equal, full ROM Pelvis stable Small abrasion to her left knee All other extremities/joints palpated/ranged and nontender SKIN: warm, color normal  ED Results / Procedures / Treatments   Labs (all labs ordered are listed, but only abnormal results are displayed) Labs Reviewed - No data to display  EKG EKG Interpretation Date/Time:  Monday December 21 2022 23:50:51 EST Ventricular Rate:  71 PR Interval:  147 QRS Duration:  94 QT Interval:  450 QTC Calculation: 490 R Axis:   55  Text Interpretation: Sinus rhythm Consider left ventricular hypertrophy Borderline prolonged QT interval Interpretation limited secondary to artifact Confirmed by Zadie Rhine ([id number]) on 12/21/2022 11:53:30 PM  Radiology CT CERVICAL SPINE WO CONTRAST Result Date: 12/22/2022 CLINICAL DATA:   Fall EXAM: CT HEAD WITHOUT CONTRAST CT CERVICAL SPINE WITHOUT CONTRAST TECHNIQUE: Multidetector CT imaging of the head and cervical spine was performed following the standard protocol without intravenous contrast. Multiplanar CT image reconstructions of the cervical spine were also generated. RADIATION DOSE REDUCTION: This exam was performed according to the departmental dose-optimization program which includes automated exposure control, adjustment of the mA and/or kV according to patient size and/or use of iterative reconstruction technique. COMPARISON:  11/18/2022 FINDINGS: CT HEAD FINDINGS Brain: There is no mass, hemorrhage or extra-axial collection. The size and configuration of the ventricles and extra-axial CSF spaces are normal. There is hypoattenuation of the periventricular white matter, most commonly indicating chronic ischemic microangiopathy. Vascular: No abnormal hyperdensity of the major intracranial arteries or dural venous sinuses. No intracranial atherosclerosis. Skull: The visualized skull base, calvarium and extracranial soft tissues are normal. Sinuses/Orbits:  No fluid levels or advanced mucosal thickening of the visualized paranasal sinuses. No mastoid or middle ear effusion. The orbits are normal. CT CERVICAL SPINE FINDINGS Alignment: No static subluxation. Facets are aligned. Occipital condyles are normally positioned. Skull base and vertebrae: No acute fracture. Soft tissues and spinal canal: No prevertebral fluid or swelling. No visible canal hematoma. Disc levels: No advanced spinal canal or neural foraminal stenosis. Upper chest: No pneumothorax, pulmonary nodule or pleural effusion. Other: Normal visualized paraspinal cervical soft tissues. IMPRESSION: 1. No acute intracranial abnormality. 2. Chronic ischemic microangiopathy. 3. No acute fracture or static subluxation of the cervical spine. Electronically Signed   By: Deatra Robinson M.D.   On: 12/22/2022 01:00   CT HEAD WO  CONTRAST Result Date: 12/22/2022 CLINICAL DATA:  Fall EXAM: CT HEAD WITHOUT CONTRAST CT CERVICAL SPINE WITHOUT CONTRAST TECHNIQUE: Multidetector CT imaging of the head and cervical spine was performed following the standard protocol without intravenous contrast. Multiplanar CT image reconstructions of the cervical spine were also generated. RADIATION DOSE REDUCTION: This exam was performed according to the departmental dose-optimization program which includes automated exposure control, adjustment of the mA and/or kV according to patient size and/or use of iterative reconstruction technique. COMPARISON:  11/18/2022 FINDINGS: CT HEAD FINDINGS Brain: There is no mass, hemorrhage or extra-axial collection. The size and configuration of the ventricles and extra-axial CSF spaces are normal. There is hypoattenuation of the periventricular white matter, most commonly indicating chronic ischemic microangiopathy. Vascular: No abnormal hyperdensity of the major intracranial arteries or dural venous sinuses. No intracranial atherosclerosis. Skull: The visualized skull base, calvarium and extracranial soft tissues are normal. Sinuses/Orbits: No fluid levels or advanced mucosal thickening of the visualized paranasal sinuses. No mastoid or middle ear effusion. The orbits are normal. CT CERVICAL SPINE FINDINGS Alignment: No static subluxation. Facets are aligned. Occipital condyles are normally positioned. Skull base and vertebrae: No acute fracture. Soft tissues and spinal canal: No prevertebral fluid or swelling. No visible canal hematoma. Disc levels: No advanced spinal canal or neural foraminal stenosis. Upper chest: No pneumothorax, pulmonary nodule or pleural effusion. Other: Normal visualized paraspinal cervical soft tissues. IMPRESSION: 1. No acute intracranial abnormality. 2. Chronic ischemic microangiopathy. 3. No acute fracture or static subluxation of the cervical spine. Electronically Signed   By: Deatra Robinson M.D.    On: 12/22/2022 01:00    Procedures Procedures    Medications Ordered in ED Medications - No data to display  ED Course/ Medical Decision Making/ A&P Clinical Course as of 12/22/22 0104  Tue Dec 22, 2022  1610 I spoke to the nursing facility, they report patient had a witnessed fall and that she tripped.  They reported she had an abrasion to her left knee and hit her head with a hematoma She is otherwise at her baseline [DW]    Clinical Course User Index [DW] Zadie Rhine, MD                                 Medical Decision Making Amount and/or Complexity of Data Reviewed Radiology: ordered.   This patient presents to the ED for concern of head trauma, this involves an extensive number of treatment options, and is a complaint that carries with it a high risk of complications and morbidity.  The differential diagnosis includes but is not limited to subdural hematoma, subarachnoid hemorrhage, skull fracture, concussion  Comorbidities that complicate the patient evaluation: Patient's presentation is complicated by  their history of dementia, anticoagulation use  Social Determinants of Health: Patient's  poor mobility   increases the complexity of managing their presentation  Additional history obtained: Additional history obtained from EMS  Records reviewed previous admission documents  Imaging Studies ordered: I ordered imaging studies including CT scan head and C-spine   I independently visualized and interpreted imaging which showed no acute findings I agree with the radiologist interpretation   Reevaluation: After the interventions noted above, I reevaluated the patient and found that they have :stayed the same  Complexity of problems addressed: Patient's presentation is most consistent with  acute presentation with potential threat to life or bodily function  Disposition: After consideration of the diagnostic results and the patient's response to treatment,   I feel that the patent would benefit from discharge   .           Final Clinical Impression(s) / ED Diagnoses Final diagnoses:  Fall, initial encounter  Injury of head, initial encounter  Strain of neck muscle, initial encounter    Rx / DC Orders ED Discharge Orders     None         Zadie Rhine, MD 12/22/22 0105

## 2022-12-21 NOTE — Progress Notes (Signed)
Orthopedic Tech Progress Note Patient Details:  [name] [birth year] [id number]  Patient ID: [name], female   DOB: July 21, 1934, 88 y.o.   MRN: [id number] I attended trauma page. Trinna Post 12/21/2022, 11:42 PM

## 2022-12-22 NOTE — Progress Notes (Signed)
   12/21/22 2310  Spiritual Encounters  Type of Visit Initial  Care provided to: Pt not available  Referral source Trauma page  Reason for visit Trauma  OnCall Visit No   Chaplain responded to a level two trauma. The patient was attended to by the medical team. No family is present. If a chaplain is requested someone will respond.   Valerie Roys [facility]  [phone]

## 2022-12-22 NOTE — ED Notes (Signed)
P[facility] called 

## 2023-05-27 ENCOUNTER — Emergency Department (HOSPITAL_COMMUNITY): Payer: Medicare (Managed Care)

## 2023-05-27 ENCOUNTER — Emergency Department (HOSPITAL_COMMUNITY)
Admission: EM | Admit: 2023-05-27 | Discharge: 2023-05-27 | Disposition: A | Discharge status: Home / Self Care | Payer: Medicare (Managed Care) | Attending: Emergency Medicine | Admitting: Emergency Medicine

## 2023-05-27 DIAGNOSIS — Z23 Encounter for immunization: Secondary | ICD-10-CM | POA: Diagnosis not present

## 2023-05-27 DIAGNOSIS — Z7901 Long term (current) use of anticoagulants: Secondary | ICD-10-CM | POA: Diagnosis not present

## 2023-05-27 DIAGNOSIS — S0181XA Laceration without foreign body of other part of head, initial encounter: Secondary | ICD-10-CM | POA: Insufficient documentation

## 2023-05-27 DIAGNOSIS — Z8542 Personal history of malignant neoplasm of other parts of uterus: Secondary | ICD-10-CM | POA: Insufficient documentation

## 2023-05-27 DIAGNOSIS — S199XXA Unspecified injury of neck, initial encounter: Secondary | ICD-10-CM | POA: Insufficient documentation

## 2023-05-27 DIAGNOSIS — S0990XA Unspecified injury of head, initial encounter: Secondary | ICD-10-CM | POA: Diagnosis present

## 2023-05-27 DIAGNOSIS — W19XXXA Unspecified fall, initial encounter: Secondary | ICD-10-CM | POA: Diagnosis not present

## 2023-05-27 DIAGNOSIS — F0390 Unspecified dementia without behavioral disturbance: Secondary | ICD-10-CM | POA: Diagnosis not present

## 2023-05-27 DIAGNOSIS — N189 Chronic kidney disease, unspecified: Secondary | ICD-10-CM | POA: Diagnosis not present

## 2023-05-27 DIAGNOSIS — G20A1 Parkinson's disease without dyskinesia, without mention of fluctuations: Secondary | ICD-10-CM | POA: Insufficient documentation

## 2023-05-27 LAB — COMPREHENSIVE METABOLIC PANEL WITH GFR
ALT: <5 U/L (ref 0–44)
AST: 19 U/L (ref 15–41)
Albumin: 3.2 g/dL — ABNORMAL LOW (ref 3.5–5.0)
Alkaline Phosphatase: 49 U/L (ref 38–126)
Anion gap: 6 (ref 5–15)
BUN: 22 mg/dL (ref 8–23)
CO2: 28 mmol/L (ref 22–32)
Calcium: 9.8 mg/dL (ref 8.9–10.3)
Chloride: 109 mmol/L (ref 98–111)
Creatinine, Ser: 1.18 mg/dL — ABNORMAL HIGH (ref 0.44–1.00)
GFR, Estimated: 45 mL/min — ABNORMAL LOW (ref >60)
Glucose, Bld: 88 mg/dL (ref 70–99)
Potassium: 3.8 mmol/L (ref 3.5–5.1)
Sodium: 143 mmol/L (ref 135–145)
Total Bilirubin: 0.6 mg/dL (ref 0.0–1.2)
Total Protein: 6 g/dL — ABNORMAL LOW (ref 6.5–8.1)

## 2023-05-27 LAB — CBC
HCT: 33.3 % — ABNORMAL LOW (ref 36.0–46.0)
Hemoglobin: 10.7 g/dL — ABNORMAL LOW (ref 12.0–15.0)
MCH: 31 pg (ref 26.0–34.0)
MCHC: 32.1 g/dL (ref 30.0–36.0)
MCV: 96.5 fL (ref 80.0–100.0)
Platelets: 194 10*3/uL (ref 150–400)
RBC: 3.45 MIL/uL — ABNORMAL LOW (ref 3.87–5.11)
RDW: 12.6 % (ref 11.5–15.5)
WBC: 5.4 10*3/uL (ref 4.0–10.5)
nRBC: 0 % (ref 0.0–0.2)

## 2023-05-27 MED ORDER — LIDOCAINE-EPINEPHRINE (PF) 2 %-1:200000 IJ SOLN
10.0000 mL | Freq: Once | INTRAMUSCULAR | Status: AC
  Administered 2023-05-27: 10 mL
  Filled 2023-05-27: qty 20

## 2023-05-27 MED ORDER — TETANUS-DIPHTH-ACELL PERTUSSIS 5-2.5-18.5 LF-MCG/0.5 IM SUSY
0.5000 mL | PREFILLED_SYRINGE | Freq: Once | INTRAMUSCULAR | Status: AC
  Administered 2023-05-27: 0.5 mL via INTRAMUSCULAR
  Filled 2023-05-27: qty 0.5

## 2023-05-27 NOTE — ED Provider Notes (Signed)
 [place] EMERGENCY DEPARTMENT AT [facility] Provider Note  History  Chief Complaint:  No chief complaint on file.  The history is provided by the patient and the EMS personnel.  Trauma Mechanism of injury: Fall Injury location: head/neck and face Injury location detail: head and forehead Time since incident: 1 hour Arrived directly from scene: yes   Fall:      Fall occurred: in unknown circumstances      Impact surface: unknown      Point of impact: face  EMS/PTA data:      Immobilization: C-collar      Mental status condition since incident: at baseline per facility AxO 1 (name)  Relevant PMH:      Pharmacological risk factors:            Anticoagulation therapy.       Tetanus status: unknown    [name] is a 89 y.o. female with a history of DVT who presents the emergency department after a fall.  Patient currently is on anticoagulants.  Past Medical History:  Diagnosis Date   Anorexia    Anxiety    Blood transfusion without reported diagnosis    1970's   Chronic diarrhea    CKD (chronic kidney disease)    Clotting disorder (HCC)    Dementia (HCC)    Depression    DVT (deep venous thrombosis) (HCC)    Endometrial ca (HCC)    GERD (gastroesophageal reflux disease)    Hiatal hernia    History of D&C    History of sinus surgery    Hypokalemia    IBS (irritable bowel syndrome)    Insomnia    Insomnia    Multiple thyroid  nodules 12/2005   b/l nodules thyroid     Neuropathy    Parkinson's disease (HCC)    Pulmonary embolus (HCC)    Radiation Jan.3-Feb.6,2008   External Beam to pelvis   Radiation Feb.13,20 and 27/2008   Intracavitary brachytherapy   Seizure disorder (HCC)    Stroke (HCC)    R brain Left hemiparesis mild    Past Surgical History:  Procedure Laterality Date   ABDOMINAL HYSTERECTOMY  08/11/05   tah,bso,pelvic lymphadenectomy   ankle fracture repair Left    antroscopy     maxillary   CHOLECYSTECTOMY     COLONOSCOPY   2012   benign colonic polyps   ESOPHAGOGASTRODUODENOSCOPY  2012   neg   IVC Filter  2007   s/p DVT and P.E>   LAPAROSCOPIC ASSISTED RADICAL VAGINAL HYSTERECTOMY W/ NODE BIOPSY  08/11/05   LYMPHADENECTOMY     rotator cuff repair Right     Family History  Problem Relation Age of Onset   Stomach cancer Sister    Brain cancer Brother    Lung cancer Brother    Breast cancer Sister    Colon cancer Neg Hx     Social History   Tobacco Use   Smoking status: Never   Smokeless tobacco: Never  Substance Use Topics   Alcohol use: No   Drug use: No    Review of Systems  Review of Systems   Reviewed and documented in HPI if pertinent.   Physical Exam   Vitals:   05/27/23 2248 05/27/23 2249  BP: (!) 160/98   Pulse: 75   Resp: 18   Temp: 98.2 F (36.8 C)   SpO2: 100% 100%     Physical Exam  Constitutional Nursing notes reviewed Vital signs reviewed  Head 3cm  laceration to glabella; no bleeding   ENT PERRL No conjunctival hemorrhage No periorbital ecchymoses, [place] Eyes, or Battle Sign bilaterally Ears atraumatic No nasal septal deviation or hematoma Mouth and tongue atraumatic Trachea midline.  Neck No C spine stepoffs, deformities, or tenderness  Chest Clavicles atraumatic Clavicles stable to anterior compression without crepitus Chest wall with symmetric expansion Chest wall stable to anterior and lateral compression without crepitus  Respiratory Effort normal CTAB No respiratory distress  CV Normal rate DP and radial pulses 2+ and equal bilaterally  Abdomen Soft Non-tender Non-distended No peritonitis No abrasions/contusions  GU Atraumatic No gross blood  MSK Atraumatic No obvious deformity ROM appropriate Pelvis stable to lateral compression  Back T spine non-tender L spine non-tender No step offs or deformities   Skin Warm Dry  Neuro Awake and alert Moving all extremities GCS 15 (mental status at baseline  A&Ox1)   Procedures   .Laceration Repair  Date/Time: 05/27/2023 11:16 PM  Performed by: Arminda Landmark, MD Authorized by: Albertus Hughs, DO   Consent:    Consent obtained:  Verbal Anesthesia:    Anesthesia method:  Local infiltration   Local anesthetic:  Lidocaine  2% WITH epi Laceration details:    Location:  Face   Face location:  Forehead   Length (cm):  3 Exploration:    Wound extent: no signs of injury, no underlying fracture and no vascular damage     Wound extent comment:  Mackie Sayre not violated Treatment:    Area cleansed with:  Saline   Amount of cleaning:  Standard   Irrigation solution:  Sterile saline Skin repair:    Repair method:  Sutures   Suture size:  5-0   Suture material:  Fast-absorbing gut   Suture technique:  Simple interrupted   Number of sutures:  6 Approximation:    Approximation:  Close Repair type:    Repair type:  Simple Post-procedure details:    Dressing:  Open (no dressing)   Procedure completion:  Tolerated well, no immediate complications   ED Course - Medical Decision Making  Brief Overview [name] is a 89 y.o. female who presents as per above.  I have reviewed the nursing documentation for past medical history, family history, and social history and agree.  I have reviewed the patient's vital signs. There are no abnormalities.  Initial Differential Diagnoses: I am primarily concerned for laceration, intracranial bleeding, skull fracture, cervical spine fracture.  Therapies: These medications and interventions were provided for the patient while in the ED.  Medications  Tdap (BOOSTRIX ) injection 0.5 mL (0.5 mLs Intramuscular Given 05/27/23 1934)  lidocaine -EPINEPHrine  (XYLOCAINE  W/EPI) 2 %-1:200000 (PF) injection 10 mL (10 mLs Infiltration Given by Other 05/27/23 2130)    Testing Results: On my interpretation labs are significant for : Hgb 10.7 Cr 1.18  On my interpretation imaging is significant for: CT head without  intracranial abnormality CT cervical spine without acute fracture CXR and pelvis XR wnl  See the EMR for full details regarding lab and imaging results.   Medical Decision Making [name] is a 89 y.o. female with significant PMHx as above who presented to the ED by EMS as an activated Level 2 trauma for fall on anticoagulation.  Prior to arrival of the patient, the room was prepared with the following: code cart to bedside, glidescope, suction, BVM.   Upon arrival of the patient, EMS provided pertinent history and exam findings. The patient was transferred over to the trauma bed. ABCs intact as  exam below. Once IVs were established, the secondary exam was performed. Pertinent physical exam findings include 3cm laceration to forehead without bleeding. Portable XRs performed at the bedside, negative for acute injuries.The patient was then prepared and sent to the CT for trauma scans.   CT head and c spine were performed and results are significant for negative for acute traumatic injuries. Trauma labs reveal hemoglobin above baseline, creatinine at baseline.   Laceration was repaired in accordance procedure note above.  Patient tolerated this well.  I did contact patient's daughter to update plan of care.  We reviewed that she did have a laceration which I repaired.  We also reviewed that her CT head and neck was negative for any acute injuries.  She was grateful for her care and stated that she was comfortable with patient being discharged back to her facility.  Problems Addressed: Fall, initial encounter: acute illness or injury that poses a threat to life or bodily functions  Amount and/or Complexity of Data Reviewed Labs: ordered. Radiology: ordered.  Risk Prescription drug management.     ### All radiography studies, electrocardiograms, and laboratory data were personally reviewed by me and incorporated into my medical decision making. Impression   1. Fall, initial  encounter   2. Anticoagulated      Note: Engineer, civil (consulting) software was used in the creation of this note.     Arminda Landmark, MD 05/27/23 2317    Albertus Hughs, DO 05/27/23 2337

## 2023-05-27 NOTE — ED Triage Notes (Signed)
 Pt BIB [facility] from [facility] as L2 FOT.  Pt takes Eliquis .  Hx of PE and DVT.  Pt found face down on floor 1 hr prior to transport. LOC unable to be determined.   Pt has hx of dementia and neuropathy. Pt has lac to forehad, bleeding controlled and complains of left lower back pain. Pt mentation is at baseline which is D/O x 4 but able to follow commands.   108/60 HR 82 palp,

## 2023-05-27 NOTE — Discharge Instructions (Addendum)
 [name]:  Thank you for allowing us  to take care of you today.  We hope you begin feeling better soon. You were seen today for fall with laceration to forehead.  We did perform CT imaging of your head and neck which were negative for traumatic injuries.  We did place 6 sutures in your forehead.  These will dissolve.  Do not need to be removed.  You do not need antibiotics.  You can return to your facility.  To-Do:  Please follow-up with your primary doctor within the next 2-3 days. It is important that you review any labs or imaging results (if any) that you had today with them. Your preliminary imaging results (if any) are attached. Please return to the Emergency Department or call 911 if you experience chest pain, shortness of breath, severe pain, severe fever, altered mental status, or have any reason to think that you need emergency medical care.  Thank you again.  Hope you feel better soon.  Arminda Landmark, MD Department of Emergency Medicine

## 2023-05-27 NOTE — ED Notes (Signed)
[facility] CALLED  °

## 2023-05-27 NOTE — Progress Notes (Signed)
 Orthopedic Tech Progress Note Patient Details:  [name] December 17, 1934 [id number]  Level II trauma, no ortho tech needs at this time.  Patient ID: MALAYSHIA ALL, female   DOB: [birth year], 89 y.o.   MRN: [id number]  Cozette Divine 05/27/2023, 8:23 PM

## 2023-12-17 ENCOUNTER — Emergency Department (HOSPITAL_COMMUNITY)
Admission: EM | Admit: 2023-12-17 | Discharge: 2023-12-17 | Disposition: A | Discharge status: Skilled Nursing Facility | Payer: Medicare (Managed Care) | Attending: Emergency Medicine | Admitting: Emergency Medicine

## 2023-12-17 ENCOUNTER — Other Ambulatory Visit: Payer: Self-pay

## 2023-12-17 ENCOUNTER — Emergency Department (HOSPITAL_COMMUNITY): Payer: Medicare (Managed Care)

## 2023-12-17 ENCOUNTER — Encounter (HOSPITAL_COMMUNITY): Payer: Self-pay

## 2023-12-17 DIAGNOSIS — I4891 Unspecified atrial fibrillation: Secondary | ICD-10-CM | POA: Diagnosis not present

## 2023-12-17 DIAGNOSIS — Z043 Encounter for examination and observation following other accident: Secondary | ICD-10-CM | POA: Insufficient documentation

## 2023-12-17 DIAGNOSIS — Z7901 Long term (current) use of anticoagulants: Secondary | ICD-10-CM | POA: Insufficient documentation

## 2023-12-17 DIAGNOSIS — W19XXXA Unspecified fall, initial encounter: Secondary | ICD-10-CM | POA: Insufficient documentation

## 2023-12-17 DIAGNOSIS — R4182 Altered mental status, unspecified: Secondary | ICD-10-CM | POA: Diagnosis present

## 2023-12-17 NOTE — ED Notes (Signed)
 Trauma Response Nurse Documentation   [name] is a 89 y.[name]. female arriving to [facility] ED via EMS  On Eliquis  (apixaban ) daily. Trauma was activated as a Level 2 by ED charge RN based on the following trauma criteria Elderly patients > 65 with head trauma on anti-coagulation (excluding ASA).  Patient cleared for CT by Dr. Jerral EDP. Pt transported to CT with trauma response nurse present to monitor. RN remained with the patient throughout their absence from the department for clinical observation.   GCS 14.   History   Past Medical History:  Diagnosis Date   Anorexia    Anxiety    Blood transfusion without reported diagnosis    1970's   Chronic diarrhea    CKD (chronic kidney disease)    Clotting disorder    Dementia (HCC)    Depression    DVT (deep venous thrombosis) (HCC)    Endometrial ca (HCC)    GERD (gastroesophageal reflux disease)    Hiatal hernia    History of D&C    History of sinus surgery    Hypokalemia    IBS (irritable bowel syndrome)    Insomnia    Insomnia    Multiple thyroid  nodules 12/2005   b/l nodules thyroid     Neuropathy    Parkinson's disease (HCC)    Pulmonary embolus (HCC)    Radiation Jan.3-Feb.6,2008   External Beam to pelvis   Radiation Feb.13,20 and 27/2008   Intracavitary brachytherapy   Seizure disorder (HCC)    Stroke (HCC)    R brain Left hemiparesis mild     Past Surgical History:  Procedure Laterality Date   ABDOMINAL HYSTERECTOMY  08/11/05   [place],bso,pelvic lymphadenectomy   ankle fracture repair Left    antroscopy     maxillary   CHOLECYSTECTOMY     COLONOSCOPY  2012   benign colonic polyps   ESOPHAGOGASTRODUODENOSCOPY  2012   neg   IVC Filter  2007   s/p DVT and P.E>   LAPAROSCOPIC ASSISTED RADICAL VAGINAL HYSTERECTOMY W/ NODE BIOPSY  08/11/05   LYMPHADENECTOMY     rotator cuff repair Right        Initial Focused Assessment (If applicable, or please see trauma documentation): Confused female presents  via EMS from SNF for a fall out of her wheelchair, happened approx 4hrs prior to arrival. No obvious trauma noted. Pt agitated, difficult to redirect.  Airway patent, BS clear No obvious uncontrolled hemorrhage GCS 14  CT's Completed:   CT Head and CT C-Spine   Interventions:  CT head and c-spine Labs XRAYS deferred by EDP  Plan for disposition:  Discharge home   Consults completed:  none  Event Summary: Presents via EMS from SNF, per report pt fell backwards in her wheelchair several hours ago. No obvious trauma noted. Pt agitated, upset with staff touching her and requesting to go home. CT head and c-spine completed. Family updated by Dr. Jerral. Trauma scans unremarkable, D/c back to facility.   Bedside handoff with ED RN Alan.    [name] [name]  Trauma Response RN  Please call TRN at [phone] for further assistance.

## 2023-12-17 NOTE — Progress Notes (Signed)
°   12/17/23 0301  Spiritual Encounters  Type of Visit Initial  Care provided to: Patient  Reason for visit Trauma  OnCall Visit Yes   Responded to level 2 trauma FOT. Patient discussed relationship with 2 sons and 2 daughters, confused about grand and great grandchild. Conversation wandered several time. Attempt made to provided support. Patient says she wants to go home.

## 2023-12-17 NOTE — ED Provider Notes (Signed)
 [place] EMERGENCY DEPARTMENT AT [facility] Provider Note   CSN: [id number] Arrival date & time: 12/17/23  0209 Sons #:     History Chief Complaint  Patient presents with   Fall    HPI [name] is a 89 y.o. female presenting for chief complaint of fall from SNF. On AC for afib. Called all of patient's contacts and her son and daugher without answer. Finally got sister who reported that she cannot make medical decisions due to her own cognitive impairment. Patient very disoriented and simply stating that she wants to go back home to her facility. Patient's recorded medical, surgical, social, medication list and allergies were reviewed in the Snapshot window as part of the initial history.   Review of Systems   Review of Systems  Constitutional:  Negative for chills and fever.  HENT:  Negative for ear pain and sore throat.   Eyes:  Negative for pain and visual disturbance.  Respiratory:  Negative for cough and shortness of breath.   Cardiovascular:  Negative for chest pain and palpitations.  Gastrointestinal:  Negative for abdominal pain and vomiting.  Genitourinary:  Negative for dysuria and hematuria.  Musculoskeletal:  Negative for arthralgias and back pain.  Skin:  Negative for color change and rash.  Neurological:  Negative for seizures and syncope.  All other systems reviewed and are negative.   Physical Exam Updated Vital Signs BP (!) 161/98   Pulse 100   Temp 97.9 F (36.6 C) (Temporal)   Resp 17   Ht 5' 7 (1.702 m)   Wt 45 kg   SpO2 98%   BMI 15.54 kg/m  Physical Exam Constitutional:      General: She is not in acute distress.    Appearance: She is not ill-appearing or toxic-appearing.  HENT:     Head: Normocephalic and atraumatic.  Eyes:     Extraocular Movements: Extraocular movements intact.     Pupils: Pupils are equal, round, and reactive to light.  Cardiovascular:     Rate and Rhythm: Normal rate.  Pulmonary:     Effort:  No respiratory distress.  Abdominal:     General: Abdomen is flat.  Musculoskeletal:        General: No swelling, deformity or signs of injury.     Cervical back: Normal range of motion. No rigidity.  Skin:    General: Skin is warm and dry.  Neurological:     General: No focal deficit present.     Mental Status: She is alert and oriented to person, place, and time.  Psychiatric:        Mood and Affect: Mood normal.      ED Course/ Medical Decision Making/ A&P    Procedures Procedures   Medications Ordered in ED Medications - No data to display Medical Decision Making:   [name] is a 89 y.o. female who presented to the ED today with a fall at their living facility detailed above. They are not on a blood thinner. Patient placed on continuous vitals and telemetry monitoring while in ED which was reviewed periodically.   Complete initial physical exam performed, notably the patient  was hemodynamically stable  in no acute distress. No obvious deformities or injuries appreciated on extensive physical exam including active range of motion of all joints.     Reviewed and confirmed nursing documentation for past medical history, family history, social history.    Initial Assessment/Plan:   This is a patient presenting  with a moderate blunt mechanism trauma.  As such, I have considered intracranial injuries including intracranial hemorrhage, intrathoracic injuries including blunt myocardial or blunt lung injury, blunt abdominal injuries including aortic dissection, bladder injury, spleen injury, liver injury and I have considered orthopedic injuries including extremity or spinal injury. This is most consistent with an acute life/limb threatening illness complicated by underlying chronic conditions.  With the patient's presentation of moderate mechanism trauma but an otherwise reassuring exam, patient warrants targeted evaluation for potential traumatic injuries. Will proceed with  targeted evaluation for potential injuries. Will proceed with CT Head, Cervical Spine CT, and Images reviewed and agree with radiology interpretation.  CT CERVICAL SPINE WO CONTRAST Result Date: 12/17/2023 EXAM: CT CERVICAL SPINE WITHOUT CONTRAST 12/17/2023 02:32:44 AM TECHNIQUE: CT of the cervical spine was performed without the administration of intravenous contrast. Multiplanar reformatted images are provided for review. Automated exposure control, iterative reconstruction, and/or weight based adjustment of the mA/kV was utilized to reduce the radiation dose to as low as reasonably achievable. COMPARISON: CT c-spine 05/27/2023. CLINICAL HISTORY: Polytrauma, blunt FINDINGS: CERVICAL SPINE: BONES AND ALIGNMENT: No acute fracture or traumatic malalignment. DEGENERATIVE CHANGES: Multilevel mild degenerative change of the spine. SOFT TISSUES: No prevertebral soft tissue swelling. IMPRESSION: 1. No acute abnormality of the cervical spine. Electronically signed by: Morgane Naveau MD 12/17/2023 02:38 AM EST RP Workstation: [id number]   CT Head Wo Contrast Result Date: 12/17/2023 EXAM: CT HEAD WITHOUT 12/17/2023 02:32:44 AM TECHNIQUE: CT of the head was performed without the administration of intravenous contrast. Automated exposure control, iterative reconstruction, and/or weight based adjustment of the mA/kV was utilized to reduce the radiation dose to as low as reasonably achievable. COMPARISON: CT head 05/27/2023 CLINICAL HISTORY: Mental status change, unknown cause. FINDINGS: BRAIN AND VENTRICLES: No acute intracranial hemorrhage. No mass effect or midline shift. No extra-axial fluid collection. No evidence of acute infarct. No hydrocephalus. Patchy and confluent areas of decreased attenuation are noted throughout the deep and periventricular white matter of the cerebral hemispheres bilaterally suggestive of chronic microvascular ischemic changes. ORBITS: No acute abnormality. SINUSES AND MASTOIDS: Right  sphenoid sinus mucosal thickening. Otherwise, paranasal sinuses and mastoid air cells are clear. SOFT TISSUES AND SKULL: No acute skull fracture. No acute soft tissue abnormality. IMPRESSION: 1. No acute intracranial abnormality. Electronically signed by: Morgane Naveau MD 12/17/2023 02:36 AM EST RP Workstation: [id number]    Final Reassessment and Plan:   No acute injuries appreciated on CT scans. Patient has no obvious long bone deformities. Patient demanding to go back home and go to sleep.  I believe this is reasonable at this time given lack of focal injuries.  Attempted once more to update family but no answer on phone numbers.  Sister was updated on plan of care during my initial conversation with her.  Patient discharged with no further acute events.  Disposition:  I have considered need for hospitalization, however, considering all of the above, I believe this patient is stable for discharge at this time.  Patient/family educated about specific return precautions for given chief complaint and symptoms.  Patient/family educated about follow-up with PCP.     Patient/family expressed understanding of return precautions and need for follow-up. Patient spoken to regarding all imaging and laboratory results and appropriate follow up for these results. All education provided in verbal form with additional information in written form. Time was allowed for answering of patient questions. Patient discharged.    Emergency Department Medication Summary:   Medications - No data to display  Clinical Impression:  1. Fall, initial encounter      Discharge   Final Clinical Impression(s) / ED Diagnoses Final diagnoses:  Fall, initial encounter    Rx / DC Orders ED Discharge Orders     None         Jerral Meth, MD 12/17/23 [phone]

## 2023-12-17 NOTE — ED Notes (Signed)
 [facility] here to transport to [facility].

## 2023-12-17 NOTE — ED Triage Notes (Signed)
 Pt coming in from [facility] pt fell backward and hit her head, pt takes elequis.recent hx of dementia. Pt alert and oriented x3 per baseline. Facility waited to call but did not have a reason.

## 2023-12-17 NOTE — Progress Notes (Signed)
 Orthopedic Tech Progress Note Patient Details:  KHAMIYAH GREFE 1934/07/04 [id number] LV2T fell backwards on wheelchair. No obvious extremity complications.  Patient ID: OCEANNA ARRUDA, female   DOB: [birth year], 89 y.o.   MRN: [id number]  Giovanni LITTIE Lukes 12/17/2023, 2:23 AM

## 2024-01-24 ENCOUNTER — Emergency Department (HOSPITAL_COMMUNITY): Payer: Medicare (Managed Care)

## 2024-01-24 ENCOUNTER — Observation Stay (HOSPITAL_COMMUNITY)
Admission: EM | Admit: 2024-01-24 | Discharge: 2024-01-27 | Disposition: A | Discharge status: Home / Self Care | Payer: Medicare (Managed Care) | Attending: Internal Medicine | Admitting: Internal Medicine

## 2024-01-24 DIAGNOSIS — Z7901 Long term (current) use of anticoagulants: Secondary | ICD-10-CM | POA: Diagnosis not present

## 2024-01-24 DIAGNOSIS — Z79899 Other long term (current) drug therapy: Secondary | ICD-10-CM | POA: Insufficient documentation

## 2024-01-24 DIAGNOSIS — Z8542 Personal history of malignant neoplasm of other parts of uterus: Secondary | ICD-10-CM | POA: Diagnosis not present

## 2024-01-24 DIAGNOSIS — F0280 Dementia in other diseases classified elsewhere without behavioral disturbance: Secondary | ICD-10-CM | POA: Insufficient documentation

## 2024-01-24 DIAGNOSIS — G20C Parkinsonism, unspecified: Secondary | ICD-10-CM | POA: Insufficient documentation

## 2024-01-24 DIAGNOSIS — Z8673 Personal history of transient ischemic attack (TIA), and cerebral infarction without residual deficits: Secondary | ICD-10-CM | POA: Diagnosis not present

## 2024-01-24 DIAGNOSIS — R296 Repeated falls: Secondary | ICD-10-CM

## 2024-01-24 DIAGNOSIS — Z86718 Personal history of other venous thrombosis and embolism: Secondary | ICD-10-CM | POA: Insufficient documentation

## 2024-01-24 DIAGNOSIS — K922 Gastrointestinal hemorrhage, unspecified: Secondary | ICD-10-CM | POA: Diagnosis not present

## 2024-01-24 DIAGNOSIS — R079 Chest pain, unspecified: Secondary | ICD-10-CM | POA: Diagnosis present

## 2024-01-24 DIAGNOSIS — S0181XA Laceration without foreign body of other part of head, initial encounter: Secondary | ICD-10-CM | POA: Insufficient documentation

## 2024-01-24 DIAGNOSIS — W19XXXA Unspecified fall, initial encounter: Secondary | ICD-10-CM | POA: Diagnosis not present

## 2024-01-24 DIAGNOSIS — R195 Other fecal abnormalities: Secondary | ICD-10-CM

## 2024-01-24 LAB — CBC WITH DIFFERENTIAL/PLATELET
Abs Immature Granulocytes: 0.02 K/uL (ref 0.00–0.07)
Abs Immature Granulocytes: 0.02 K/uL (ref 0.00–0.07)
Basophils Absolute: 0 K/uL (ref 0.0–0.1)
Basophils Absolute: 0 K/uL (ref 0.0–0.1)
Basophils Relative: 0 %
Basophils Relative: 0 %
Eosinophils Absolute: 0 K/uL (ref 0.0–0.5)
Eosinophils Absolute: 0 K/uL (ref 0.0–0.5)
Eosinophils Relative: 0 %
Eosinophils Relative: 1 %
HCT: 22.5 % — ABNORMAL LOW (ref 36.0–46.0)
HCT: 33.1 % — ABNORMAL LOW (ref 36.0–46.0)
Hemoglobin: 7.1 g/dL — ABNORMAL LOW (ref 12.0–15.0)
Hemoglobin: 10.7 g/dL — ABNORMAL LOW (ref 12.0–15.0)
Immature Granulocytes: 0 %
Immature Granulocytes: 0 %
Lymphocytes Relative: 28 %
Lymphocytes Relative: 27 %
Lymphs Abs: 1.2 K/uL (ref 0.7–4.0)
Lymphs Abs: 1.4 K/uL (ref 0.7–4.0)
MCH: 31 pg (ref 26.0–34.0)
MCH: 30.7 pg (ref 26.0–34.0)
MCHC: 31.6 g/dL (ref 30.0–36.0)
MCHC: 32.3 g/dL (ref 30.0–36.0)
MCV: 98.3 fL (ref 80.0–100.0)
MCV: 95.1 fL (ref 80.0–100.0)
Monocytes Absolute: 0.3 K/uL (ref 0.1–1.0)
Monocytes Absolute: 0.3 K/uL (ref 0.1–1.0)
Monocytes Relative: 7 %
Monocytes Relative: 6 %
Neutro Abs: 2.9 K/uL (ref 1.7–7.7)
Neutro Abs: 3.3 K/uL (ref 1.7–7.7)
Neutrophils Relative %: 65 %
Neutrophils Relative %: 66 %
Platelets: 129 K/uL — ABNORMAL LOW (ref 150–400)
Platelets: UNDETERMINED K/uL (ref 150–400)
RBC: 2.29 MIL/uL — ABNORMAL LOW (ref 3.87–5.11)
RBC: 3.48 MIL/uL — ABNORMAL LOW (ref 3.87–5.11)
RDW: 12.6 % (ref 11.5–15.5)
RDW: 12.6 % (ref 11.5–15.5)
WBC: 4.5 K/uL (ref 4.0–10.5)
WBC: 5 K/uL (ref 4.0–10.5)
nRBC: 0 % (ref 0.0–0.2)
nRBC: 0 % (ref 0.0–0.2)

## 2024-01-24 LAB — BASIC METABOLIC PANEL WITH GFR
Anion gap: 7 (ref 5–15)
BUN: 26 mg/dL — ABNORMAL HIGH (ref 8–23)
CO2: 30 mmol/L (ref 22–32)
Calcium: 9.3 mg/dL (ref 8.9–10.3)
Chloride: 108 mmol/L (ref 98–111)
Creatinine, Ser: 1.21 mg/dL — ABNORMAL HIGH (ref 0.44–1.00)
GFR, Estimated: 43 mL/min — ABNORMAL LOW
Glucose, Bld: 105 mg/dL — ABNORMAL HIGH (ref 70–99)
Potassium: 3.5 mmol/L (ref 3.5–5.1)
Sodium: 145 mmol/L (ref 135–145)

## 2024-01-24 LAB — I-STAT CHEM 8, ED
BUN: 25 mg/dL — ABNORMAL HIGH (ref 8–23)
Calcium, Ion: 1.17 mmol/L (ref 1.15–1.40)
Chloride: 110 mmol/L (ref 98–111)
Creatinine, Ser: 1.1 mg/dL — ABNORMAL HIGH (ref 0.44–1.00)
Glucose, Bld: 80 mg/dL (ref 70–99)
HCT: 30 % — ABNORMAL LOW (ref 36.0–46.0)
Hemoglobin: 10.2 g/dL — ABNORMAL LOW (ref 12.0–15.0)
Potassium: 3.1 mmol/L — ABNORMAL LOW (ref 3.5–5.1)
Sodium: 148 mmol/L — ABNORMAL HIGH (ref 135–145)
TCO2: 24 mmol/L (ref 22–32)

## 2024-01-24 LAB — APTT: aPTT: 27 s (ref 24–36)

## 2024-01-24 LAB — I-STAT CG4 LACTIC ACID, ED: Lactic Acid, Venous: 0.7 mmol/L (ref 0.5–1.9)

## 2024-01-24 LAB — POC OCCULT BLOOD, ED: Fecal Occult Bld: POSITIVE — AB

## 2024-01-24 MED ORDER — FENTANYL CITRATE (PF) 50 MCG/ML IJ SOSY
50.0000 ug | PREFILLED_SYRINGE | Freq: Once | INTRAMUSCULAR | Status: AC
  Administered 2024-01-24: 50 ug via INTRAVENOUS
  Filled 2024-01-24: qty 1

## 2024-01-24 MED ORDER — METHYLPREDNISOLONE SODIUM SUCC 40 MG IJ SOLR
40.0000 mg | Freq: Once | INTRAMUSCULAR | Status: AC
  Administered 2024-01-24: 40 mg via INTRAVENOUS
  Filled 2024-01-24: qty 1

## 2024-01-24 MED ORDER — DIPHENHYDRAMINE HCL 25 MG PO CAPS: 50.0000 mg | ORAL_CAPSULE | Freq: Once | ORAL | Status: AC

## 2024-01-24 MED ORDER — HALOPERIDOL LACTATE 5 MG/ML IJ SOLN
1.0000 mg | Freq: Once | INTRAMUSCULAR | Status: DC
  Filled 2024-01-24 (×3): qty 1

## 2024-01-24 MED ORDER — HALOPERIDOL LACTATE 5 MG/ML IJ SOLN: 2.5000 mg | Freq: Once | INTRAMUSCULAR | Status: DC

## 2024-01-24 MED ORDER — DIPHENHYDRAMINE HCL 50 MG/ML IJ SOLN
50.0000 mg | Freq: Once | INTRAMUSCULAR | Status: AC
  Administered 2024-01-24: 50 mg via INTRAVENOUS
  Filled 2024-01-24: qty 1

## 2024-01-24 MED ORDER — IOHEXOL 350 MG/ML SOLN
50.0000 mL | Freq: Once | INTRAVENOUS | Status: AC | PRN
  Administered 2024-01-24: 50 mL via INTRAVENOUS

## 2024-01-24 NOTE — ED Notes (Signed)
 Attempted x2 to call pt's daughter

## 2024-01-24 NOTE — ED Notes (Signed)
 Patient transported to CT

## 2024-01-24 NOTE — ED Notes (Signed)
Pt speaking to son on the phone

## 2024-01-24 NOTE — ED Provider Notes (Signed)
 " [place] EMERGENCY DEPARTMENT AT [facility] Provider Note   CSN: [id number] Arrival date & time: 01/24/24  1858     Patient presents with: Fall (thinners)   [name] is a 89 y.o. female with past medical history of PE, on Eliquis , DVT, Parkinson's, stroke, GERD, endometrial cancer, presents emergency department for evaluation of a fall on blood thinners.  Patient was reportedly standing upright when she fell forward hitting her head and her knees.  Patient did not lose consciousness.  This was a witnessed fall at her facility.  Patient is currently reporting pain to her left side, around her rib cage, her bilateral knees and left ankle.  As she also reports significant discomfort in the c-collar.  Per EMS, patient has a GCS of 14, which is her baseline and has been acting normally.    HPI     Prior to Admission medications  Medication Sig Start Date End Date Taking? Authorizing Provider  acetaminophen  (TYLENOL ) 650 MG CR tablet Take 1,300 mg by mouth every 8 (eight) hours as needed for pain.    [provider]  apixaban  (ELIQUIS ) 2.5 MG TABS tablet Take 2.5 mg by mouth 2 (two) times daily.    [provider]  calcium  carbonate (TUMS EX) 750 MG chewable tablet Chew 750 mg by mouth 2 (two) times daily.    [provider]  carbidopa -levodopa  (SINEMET  IR) 25-100 MG tablet Take 1 tablet by mouth 4 (four) times daily. 1000, 1400, 1800, 2200    [provider]  cholecalciferol  (VITAMIN D ) 1000 UNITS tablet Take 1,000 Units by mouth daily.    [provider]  Cyanocobalamin  (VITAMIN B 12) 100 MCG LOZG Take 1,000 mcg by mouth daily.    [provider]  escitalopram  (LEXAPRO ) 5 MG tablet Take 1 tablet (5 mg total) by mouth daily. Patient taking differently: Take 10 mg by mouth daily. 04/08/18   Lissa Earnie RAMAN, MD  famotidine  (PEPCID ) 20 MG tablet Take 20 mg by mouth daily.    [provider]  magnesium  gluconate  (MAGONATE) 500 MG tablet Take 500 mg by mouth 2 (two) times daily.    [provider]  mirtazapine  (REMERON ) 7.5 MG tablet Take 1 tablet (7.5 mg total) by mouth at bedtime. 11/24/22   Patel, Pranav M, MD  Multiple Vitamin (MULTIVITAMIN WITH MINERALS) TABS tablet Take 1 tablet by mouth daily. 11/24/22   Tobie Yetta HERO, MD  NON FORMULARY Take 120 mLs by mouth 2 (two) times daily. Med Pass    [provider]  NONFORMULARY OR COMPOUNDED ITEM Apply 1 Application topically daily as needed (Severe anxiety.). Ativan  Gel: Apply (1mg /ml) 1mL once every 24 hours as needed.    [provider]  Nutritional Supplements (FEEDING SUPPLEMENT, KATE FARMS STANDARD 1.4,) LIQD liquid Take 325 mLs by mouth 2 (two) times daily between meals. 11/24/22   Tobie Yetta HERO, MD  OLANZapine  (ZYPREXA ) 10 MG tablet Take 10 mg by mouth at bedtime.    [provider]  pantoprazole  (PROTONIX ) 40 MG tablet Take 1 tablet (40 mg total) by mouth daily for 14 days. 11/24/22 12/08/22  Tobie Yetta HERO, MD  Skin Protectants, Misc. (MINERIN CREME EX) Apply 1 Application topically daily.    [provider]  thiamine  (VITAMIN B-1) 100 MG tablet Take 1 tablet (100 mg total) by mouth daily. 11/24/22   Tobie Yetta HERO, MD    Allergies: Aspridrox [aspirin buf(alhyd-mghyd-cacar)], Codeine, Darvocet [propoxyphene n-acetaminophen ], Esomeprazole magnesium , Iodine, Morphine  and codeine, Sulfa antibiotics, Sulfate, and Tramadol    Review of Systems  Musculoskeletal:  Positive for joint swelling.    Updated Vital Signs BP (!) 161/80   Pulse 80   Temp 98.1 F (36.7 C) (Oral)   Resp 18   Ht 5' 7 (1.702 m)   Wt 45 kg   SpO2 100%   BMI 15.54 kg/m   Physical Exam Vitals and nursing note reviewed.  Constitutional:      Appearance: Normal appearance.  HENT:     Head: Normocephalic and atraumatic.     Comments: Professional 0.5 cm laceration to the right lateral eyebrow.  Wound is hemostatic.  Gauze  is placed over the wound as it is oozing.    Mouth/Throat:     Mouth: Mucous membranes are moist.  Eyes:     General: No scleral icterus.       Right eye: No discharge.        Left eye: No discharge.     Conjunctiva/sclera: Conjunctivae normal.     Pupils: Pupils are equal, round, and reactive to light.  Cardiovascular:     Rate and Rhythm: Normal rate and regular rhythm.     Pulses: Normal pulses.  Pulmonary:     Effort: Pulmonary effort is normal.     Breath sounds: Normal breath sounds.  Abdominal:     General: There is no distension.     Palpations: Abdomen is soft.     Tenderness: There is no abdominal tenderness.     Comments: Soft and nontender  Musculoskeletal:        General: No deformity.     Cervical back: Normal range of motion.     Comments: C-spine nontender to palpation.  No tenderness throughout entire spine.  No step-offs or crepitus noted.  Patient is tender on the left chest, along the rib cage.  Left shoulder is tender to palpation.  Pelvis is stable.  Patient does have a bruise distal to her right knee.  At her left ankle is swollen at the lateral malleolus.  Sensation is intact.  Skin:    General: Skin is warm and dry.     Capillary Refill: Capillary refill takes less than 2 seconds.  Neurological:     Mental Status: She is alert. Mental status is at baseline.     Motor: No weakness.  Psychiatric:        Mood and Affect: Mood normal.     (all labs ordered are listed, but only abnormal results are displayed) Labs Reviewed  CBC WITH DIFFERENTIAL/PLATELET - Abnormal; Notable for the following components:      Result Value   RBC 2.29 (*)    Hemoglobin 7.1 (*)    HCT 22.5 (*)    Platelets 129 (*)    All other components within normal limits  BASIC METABOLIC PANEL WITH GFR - Abnormal; Notable for the following components:   Glucose, Bld 105 (*)    BUN 26 (*)    Creatinine, Ser 1.21 (*)    GFR, Estimated 43 (*)    All other components within normal  limits  CBC WITH DIFFERENTIAL/PLATELET - Abnormal; Notable for the following components:   RBC 3.48 (*)    Hemoglobin 10.7 (*)    HCT 33.1 (*)    All other components within normal limits  I-STAT CHEM 8, ED - Abnormal; Notable for the following components:   Sodium 148 (*)    Potassium 3.1 (*)    BUN  25 (*)    Creatinine, Ser 1.10 (*)    Hemoglobin 10.2 (*)    HCT 30.0 (*)    All other components within normal limits  POC OCCULT BLOOD, ED - Abnormal; Notable for the following components:   Fecal Occult Bld POSITIVE (*)    All other components within normal limits  APTT  I-STAT CG4 LACTIC ACID, ED    EKG: EKG Interpretation Date/Time:  Monday January 24 2024 19:52:55 EST Ventricular Rate:  79 PR Interval:  129 QRS Duration:  110 QT Interval:  415 QTC Calculation: 476 R Axis:   -39  Text Interpretation: Sinus rhythm Left ventricular hypertrophy Anterior ST elevation, probably due to LVH Confirmed by Rogelia Satterfield ([id number]) on 01/24/2024 7:58:19 PM  Radiology: CT CERVICAL SPINE WO CONTRAST Result Date: 01/24/2024 EXAM: CT CERVICAL SPINE WITHOUT CONTRAST 01/24/2024 07:47:16 PM TECHNIQUE: CT of the cervical spine was performed without the administration of intravenous contrast. Multiplanar reformatted images are provided for review. Automated exposure control, iterative reconstruction, and/or weight based adjustment of the mA/kV was utilized to reduce the radiation dose to as low as reasonably achievable. COMPARISON: 12/17/2023 CLINICAL HISTORY: Polytrauma, blunt. FINDINGS: BONES AND ALIGNMENT: No acute fracture or traumatic malalignment. There are a few small lucent foci in the cervical spine which are unchanged and likely reflect osteopenia. DEGENERATIVE CHANGES: Degenerative changes at the bilateral Atlantoaxial articulations. Intervertebral disc spaces are relatively maintained. There are mild endplate degenerative changes at multiple levels. No high grade osseous spinal canal  stenosis. Facet arthrosis at multiple levels. SOFT TISSUES: No prevertebral soft tissue swelling. Atherosclerosis at the carotid bifurcations. Heterogeneous appearance of the thyroid  with multiple subcentimeter nodules noted. IMPRESSION: 1. No acute traumatic injury. Electronically signed by: Donnice Mania MD 01/24/2024 08:15 PM EST RP Workstation: [id number]   CT HEAD WO CONTRAST Result Date: 01/24/2024 EXAM: CT HEAD WITHOUT CONTRAST 01/24/2024 07:47:16 PM TECHNIQUE: CT of the head was performed without the administration of intravenous contrast. Automated exposure control, iterative reconstruction, and/or weight based adjustment of the mA/kV was utilized to reduce the radiation dose to as low as reasonably achievable. COMPARISON: 12/17/2023 CLINICAL HISTORY: Head trauma, moderate-severe. FINDINGS: BRAIN AND VENTRICLES: No acute hemorrhage. No evidence of acute infarct. Similar appearance of remote cortical infarct in the right superior frontal gyrus. Mild chronic microvascular ischemic changes. Atherosclerosis of the carotid siphons. No hydrocephalus. No extra-axial collection. No mass effect or midline shift. ORBITS: Bilateral lens replacement. There is mild soft tissue swelling along the lateral aspect of the right orbit overlying the zygomatic arch. Recommend correlation with trauma at this site. SINUSES: Mucosal thickening and layering secretions in the right sphenoid sinus which could be associated with acute sinusitis. SOFT TISSUES AND SKULL: No acute soft tissue abnormality. No skull fracture. IMPRESSION: 1. No acute intracranial abnormality. 2. Mild soft tissue swelling along the lateral aspect of the right orbit overlying the zygomatic arch, which may reflect injury at this site. 3. Mucosal thickening and layering secretions in the right sphenoid sinus, possibly associated with acute sinusitis. Electronically signed by: Donnice Mania MD 01/24/2024 08:11 PM EST RP Workstation: [id number]   DG Shoulder  Left Port Result Date: 01/24/2024 EXAM: 2 VIEW(S) XRAY OF THE SHOULDER 01/24/2024 07:31:00 PM COMPARISON: None available. CLINICAL HISTORY: Fall FINDINGS: BONES AND JOINTS: Glenohumeral joint is normally aligned. No acute fracture. No malalignment. Mild degenerative changes of the acromioclavicular joint. SOFT TISSUES: No abnormal calcifications. Visualized lung is unremarkable. IMPRESSION: 1. No acute traumatic injury. Electronically signed by: Pinkie Pebbles MD 01/24/2024 08:02  PM EST RP Workstation: [id number]   DG Ankle Complete Left Result Date: 01/24/2024 CLINICAL DATA:  Fall EXAM: LEFT ANKLE COMPLETE - 3+ VIEW COMPARISON:  None Available. FINDINGS: Soft tissue swelling. No definitive fracture or malalignment. Mortise grossly symmetric. Mild medial and lateral degenerative changes IMPRESSION: Soft tissue swelling without definitive fracture. Electronically Signed   By: Luke Bun M.D.   On: 01/24/2024 19:42   DG Knee Left Port Result Date: 01/24/2024 CLINICAL DATA:  Fall EXAM: PORTABLE LEFT KNEE - 1-2 VIEW COMPARISON:  None Available. FINDINGS: No fracture or malalignment. Mild tricompartment arthritis of the knee. No sizable effusion. Joint space calcification consistent with chondrocalcinosis. IMPRESSION: No acute osseous abnormality Electronically Signed   By: Luke Bun M.D.   On: 01/24/2024 19:39   DG Knee Right Port Result Date: 01/24/2024 CLINICAL DATA:  Fall EXAM: PORTABLE RIGHT KNEE - 1-2 VIEW COMPARISON:  None Available. FINDINGS: No fracture or malalignment.  No sizable knee effusion IMPRESSION: No acute osseous abnormality. Electronically Signed   By: Luke Bun M.D.   On: 01/24/2024 19:38   DG Pelvis Portable Result Date: 01/24/2024 CLINICAL DATA:  Fall EXAM: PORTABLE PELVIS 1-2 VIEWS COMPARISON:  05/27/2023 FINDINGS: SI joints are non widened. Pubic symphysis and rami appear intact. No fracture or malalignment IMPRESSION: No acute osseous abnormality. Electronically Signed    By: Luke Bun M.D.   On: 01/24/2024 19:37   DG Chest Port 1 View Result Date: 01/24/2024 CLINICAL DATA:  Trauma fall laceration abrasion EXAM: PORTABLE CHEST 1 VIEW COMPARISON:  05/27/2023, 10/10/2022 FINDINGS: No acute airspace disease, pleural effusion, or pneumothorax. The heart is enlarged but stable. Old appearing fracture deformity of the left eighth posterolateral rib. Right hilar convex fullness. IMPRESSION: 1. No active disease. 2. Right hilar convex fullness, probably due to pulmonary vasculature and patient rotation, suggest two-view chest x-ray follow-up when clinically feasible Electronically Signed   By: Luke Bun M.D.   On: 01/24/2024 19:36    Procedures   Medications Ordered in the ED  haloperidol  lactate (HALDOL ) injection 1 mg (0 mg Intravenous Hold 01/24/24 2128)  fentaNYL  (SUBLIMAZE ) injection 50 mcg (50 mcg Intravenous Given 01/24/24 1931)  methylPREDNISolone  sodium succinate (SOLU-MEDROL ) 40 mg/mL injection 40 mg (40 mg Intravenous Given 01/24/24 2036)  diphenhydrAMINE  (BENADRYL ) capsule 50 mg ( Oral See Alternative 01/24/24 2252)    Or  diphenhydrAMINE  (BENADRYL ) injection 50 mg (50 mg Intravenous Given 01/24/24 2252)                                 Medical Decision Making Amount and/or Complexity of Data Reviewed Labs: ordered. Radiology: ordered.  Risk Prescription drug management.   This patient presents to the ED for concern of fall on blood thinners, this involves an extensive number of treatment options, and is a complaint that carries with it a high risk of complications and morbidity.  Differential diagnosis includes: Epidural bleeding, subdural bleeding, skull fracture, laceration, altered mental status, fractures/dislocations, hematoma  Co morbidities:  Parkinson's, PE/DVT  Lab Tests:  I Ordered, and personally interpreted labs.  The pertinent results include:    - Initial hemoglobin: 7.1 - Hemoccult: Positive  Imaging Studies:  I  ordered imaging studies including chest x-ray, pelvic x-ray, bilateral knee x-rays, left ankle x-ray, left shoulder x-ray, CT head and C-spine, CT abdomen pelvis I independently visualized and interpreted imaging which showed soft tissue swelling on the left ankle without obvious fracture.  Additional  x-rays negative for fracture or dislocation.  CT head and C-spine were negative.  CT abdomen pelvis not yet completed at this time. I agree with the radiologist interpretation  Cardiac Monitoring/ECG:  The patient was maintained on a cardiac monitor.  I personally viewed and interpreted the cardiac monitored which showed an underlying rhythm of: Sinus rhythm  Medicines ordered and prescription drug management:  I ordered medication including  Medications  haloperidol  lactate (HALDOL ) injection 1 mg (0 mg Intravenous Hold 01/24/24 2128)  fentaNYL  (SUBLIMAZE ) injection 50 mcg (50 mcg Intravenous Given 01/24/24 1931)  methylPREDNISolone  sodium succinate (SOLU-MEDROL ) 40 mg/mL injection 40 mg (40 mg Intravenous Given 01/24/24 2036)  diphenhydrAMINE  (BENADRYL ) capsule 50 mg ( Oral See Alternative 01/24/24 2252)    Or  diphenhydrAMINE  (BENADRYL ) injection 50 mg (50 mg Intravenous Given 01/24/24 2252)   for pain control Reevaluation of the patient after these medicines showed that the patient improved I have reviewed the patients home medicines and have made adjustments as needed  Test Considered:   none  Critical Interventions:   none  Consultations Obtained: None  Problem List / ED Course:     ICD-10-CM   1. Fall, initial encounter  W19.CHERENE       MDM: 89 year old female who presents emergency department for evaluation after a fall on blood thinners.  Patient takes daily Eliquis , last dose this morning.  She was reportedly standing upright when she fell forward hitting her head.  CT head and C-spine have been ordered.  Additional imaging including chest x-ray, pelvic x-ray, left  shoulder, left ankle x-ray ordered due to patient complaining of pain in those areas.  Initial imaging shows no acute fractures or dislocations.  CT head is unremarkable.  Basic labs show hemoglobin of 7.1 on her CBC compared to i-STAT hemoglobin of 10.2.  CBC was reordered.  Repeat CBC shows a hemoglobin of 10.7, which is much more along patient's baseline.  I also obtained a Hemoccult test, which was positive.  I ordered 50 of IV fentanyl  for pain control.  Upon reassessment, patient reports her pain has improved.  I did remove the c-collar once I cleared her C-spine via imaging.  Given her positive Hemoccult, I ordered a CT of her abdomen and pelvis.  She has a reported contrast allergy, so she has been premedicated and will obtain the scan once able.  Patient will likely require hospital admission.  Handoff has been given to incoming APP, who will follow patient's CT results and have her admitted to medicine.   Dispostion:  After consideration of the diagnostic results and the patients response to treatment, I feel that the patient would benefit from GI follow-up.   Final diagnoses:  Fall, initial encounter    ED Discharge Orders     None          Torrence Marry GORMAN DEVONNA 01/24/24 2348    Rogelia Jerilynn GORMAN, MD 02/04/24 1459  "

## 2024-01-24 NOTE — ED Notes (Signed)
Bed alarm active

## 2024-01-24 NOTE — ED Notes (Signed)
 Lvm to shelley , pt's daughter at pt's request

## 2024-01-24 NOTE — ED Triage Notes (Signed)
 89 yo BIB [facility] from SNF d/t report of witnessed fall from standing position. Fell over and hit head on R side, lacerations and abrasions present. Pt on eliquis , alert per normal baseline, confused at baseline.  EMS 180/88 92 BPM 18 RR 93% Cbg 93

## 2024-01-24 NOTE — ED Notes (Signed)
 Pt seen out of bed and had pulled all monitoring equipment off. Pt placed back in bed, yellow socks and posey alarm placed.

## 2024-01-24 NOTE — ED Notes (Signed)
 Pt repositioned in bed with bed alarm in place.

## 2024-01-24 NOTE — Progress Notes (Signed)
 Orthopedic Tech Progress Note Patient Details:  [name] Apr 11, 1934 [id number]  Patient ID: [name], female   DOB: [birth year], 89 y.o.   MRN: [id number] Respond to level 2 trauma [name] 01/24/2024, 7:08 PM

## 2024-01-25 ENCOUNTER — Other Ambulatory Visit: Payer: Self-pay

## 2024-01-25 ENCOUNTER — Encounter (HOSPITAL_COMMUNITY): Payer: Self-pay | Admitting: Hospitalist

## 2024-01-25 DIAGNOSIS — K922 Gastrointestinal hemorrhage, unspecified: Secondary | ICD-10-CM | POA: Diagnosis present

## 2024-01-25 MED ORDER — ADULT MULTIVITAMIN W/MINERALS CH
1.0000 | ORAL_TABLET | Freq: Every day | ORAL | Status: DC
  Administered 2024-01-25 – 2024-01-27 (×3): 1 via ORAL
  Filled 2024-01-25 (×3): qty 1

## 2024-01-25 MED ORDER — SODIUM CHLORIDE 0.9 % IV SOLN: INTRAVENOUS | Status: AC

## 2024-01-25 MED ORDER — PANTOPRAZOLE SODIUM 40 MG IV SOLR
40.0000 mg | Freq: Two times a day (BID) | INTRAVENOUS | Status: DC
  Administered 2024-01-25 – 2024-01-27 (×5): 40 mg via INTRAVENOUS
  Filled 2024-01-25 (×5): qty 10

## 2024-01-25 MED ORDER — MAGNESIUM GLUCONATE 500 (27 MG) MG PO TABS
500.0000 mg | ORAL_TABLET | Freq: Two times a day (BID) | ORAL | Status: DC
  Administered 2024-01-25 – 2024-01-27 (×5): 500 mg via ORAL
  Filled 2024-01-25 (×9): qty 1

## 2024-01-25 MED ORDER — CARBIDOPA-LEVODOPA 25-100 MG PO TABS
1.0000 | ORAL_TABLET | Freq: Four times a day (QID) | ORAL | Status: DC
  Administered 2024-01-25 – 2024-01-27 (×8): 1 via ORAL
  Filled 2024-01-25 (×9): qty 1

## 2024-01-25 MED ORDER — PIMAVANSERIN TARTRATE 34 MG PO CAPS: 34.0000 mg | ORAL_CAPSULE | Freq: Every day | ORAL | Status: DC

## 2024-01-25 MED ORDER — IOHEXOL 350 MG/ML SOLN: 50.0000 mL | Freq: Once | INTRAVENOUS | Status: DC | PRN

## 2024-01-25 MED ORDER — THIAMINE MONONITRATE 100 MG PO TABS
100.0000 mg | ORAL_TABLET | Freq: Every day | ORAL | Status: DC
  Administered 2024-01-25 – 2024-01-27 (×3): 100 mg via ORAL
  Filled 2024-01-25 (×3): qty 1

## 2024-01-25 MED ORDER — MIRTAZAPINE 15 MG PO TABS
7.5000 mg | ORAL_TABLET | Freq: Every day | ORAL | Status: DC
  Administered 2024-01-25 – 2024-01-26 (×2): 7.5 mg via ORAL
  Filled 2024-01-25 (×2): qty 1

## 2024-01-25 MED ORDER — ESCITALOPRAM OXALATE 10 MG PO TABS: 5.0000 mg | ORAL_TABLET | Freq: Every day | ORAL | Status: DC

## 2024-01-25 MED ORDER — CALCIUM CARBONATE ANTACID 500 MG PO CHEW
750.0000 mg | CHEWABLE_TABLET | Freq: Two times a day (BID) | ORAL | Status: DC
  Administered 2024-01-25 – 2024-01-27 (×5): 300 mg via ORAL
  Filled 2024-01-25 (×5): qty 2

## 2024-01-25 MED ORDER — BACITRACIN ZINC 500 UNIT/GM EX OINT
1.0000 | TOPICAL_OINTMENT | Freq: Every day | CUTANEOUS | Status: DC
  Administered 2024-01-25 – 2024-01-27 (×3): 1 via TOPICAL
  Filled 2024-01-25: qty 28.4

## 2024-01-25 MED ORDER — VITAMIN D 25 MCG (1000 UNIT) PO TABS
1000.0000 [IU] | ORAL_TABLET | Freq: Every day | ORAL | Status: DC
  Administered 2024-01-25 – 2024-01-27 (×3): 1000 [IU] via ORAL
  Filled 2024-01-25 (×3): qty 1

## 2024-01-25 MED ORDER — APIXABAN 2.5 MG PO TABS
2.5000 mg | ORAL_TABLET | Freq: Two times a day (BID) | ORAL | Status: DC
  Administered 2024-01-25 – 2024-01-27 (×5): 2.5 mg via ORAL
  Filled 2024-01-25 (×5): qty 1

## 2024-01-25 MED ORDER — ESCITALOPRAM OXALATE 10 MG PO TABS
10.0000 mg | ORAL_TABLET | Freq: Every day | ORAL | Status: DC
  Administered 2024-01-25 – 2024-01-27 (×3): 10 mg via ORAL
  Filled 2024-01-25 (×3): qty 1

## 2024-01-25 NOTE — Evaluation (Signed)
 Occupational Therapy Evaluation Patient Details Name: [name] MRN: [id number] DOB: February 12, 1934 Today's Date: 01/25/2024   History of Present Illness   [name] is a 89 yo female who presented from her facility after a fall. PMHx: DVT on Eliquis , advanced dementia iso Parkinson's disease, CVA in 2012 w/ residual mild L sided weakness, GERD, and endometrial cancer     Clinical Impressions [name] was evaluated s/p the above admission list. Is is from a LTC facility and staff assists with all ADLs and mobility at baseline. Upon evaluation the pt was limited by baseline dementia, weakness and balance. Overall she needed min A for bed mobility and to stand with HHA and simple cues. Due to the deficits listed below the pt also needs mod- max A for ADLs. Pt will benefit from continued acute OT services and no follow up OT needed at current LTC facility.      If plan is discharge home, recommend the following:   Supervision due to cognitive status;Direct supervision/assist for financial management;Direct supervision/assist for medications management;Assistance with cooking/housework;A lot of help with bathing/dressing/bathroom;A lot of help with walking and/or transfers     Functional Status Assessment   Patient has had a recent decline in their functional status and/or demonstrates limited ability to make significant improvements in function in a reasonable and predictable amount of time     Equipment Recommendations   None recommended by OT      Precautions/Restrictions   Precautions Precautions: Fall Restrictions Weight Bearing Restrictions Per Provider Order: No     Mobility Bed Mobility Overal bed mobility: Needs Assistance Bed Mobility: Supine to Sit, Sit to Supine     Supine to sit: Min assist Sit to supine: Min assist        Transfers Overall transfer level: Needs assistance Equipment used: 1 person hand held assist Transfers: Sit to/from  Stand Sit to Stand: Min assist                  Balance Overall balance assessment: Needs assistance Sitting-balance support: Feet supported Sitting balance-Leahy Scale: Fair     Standing balance support: Single extremity supported, During functional activity Standing balance-Leahy Scale: Poor                             ADL either performed or assessed with clinical judgement   ADL Overall ADL's : Needs assistance/impaired Eating/Feeding: Moderate assistance   Grooming: Moderate assistance   Upper Body Bathing: Moderate assistance   Lower Body Bathing: Maximal assistance   Upper Body Dressing : Moderate assistance   Lower Body Dressing: Maximal assistance   Toilet Transfer: Minimal assistance;Cueing for safety;Cueing for sequencing   Toileting- Clothing Manipulation and Hygiene: Maximal assistance       Functional mobility during ADLs: Minimal assistance General ADL Comments: needs step by step cues, pt is easily distracted and confused     Vision Baseline Vision/History: 1 Wears glasses Vision Assessment?: No apparent visual deficits     Perception Perception: Not tested       Praxis Praxis: Not tested       Pertinent Vitals/Pain Pain Assessment Pain Assessment: Faces Faces Pain Scale: Hurts a little bit Pain Location: some grimacing during bed mobility Pain Descriptors / Indicators: Grimacing Pain Intervention(s): Monitored during session     Extremity/Trunk Assessment Upper Extremity Assessment Upper Extremity Assessment: Generalized weakness;Difficult to assess due to impaired cognition   Lower Extremity Assessment Lower Extremity Assessment:  Defer to PT evaluation   Cervical / Trunk Assessment Cervical / Trunk Assessment: Kyphotic   Communication Communication Communication: Impaired Factors Affecting Communication: Difficulty expressing self;Reduced clarity of speech   Cognition Arousal: Alert Behavior During Therapy:  Flat affect, Restless Cognition: No family/caregiver present to determine baseline, History of cognitive impairments             OT - Cognition Comments: hx of advanced dementia, pt was only oriented to herself                 Following commands: Impaired (follows most simple 1 step commands)       Cueing  General Comments      VSS    Home Living Family/patient expects to be discharged to:: Skilled nursing facility           Additional Comments: [facility]      Prior Functioning/Environment Prior Level of Function : Needs assist             Mobility Comments: Per chart review, pt mobilized with RW and in WC. Staff assists wtih transfers and mobility ADLs Comments: per chart review, staff assists with all aspects of ADLs    OT Problem List: Decreased strength;Decreased range of motion;Decreased activity tolerance;Impaired balance (sitting and/or standing)   OT Treatment/Interventions: Therapeutic exercise;Self-care/ADL training;DME and/or AE instruction;Therapeutic activities;Patient/family education;Balance training      OT Goals(Current goals can be found in the care plan section)   Acute Rehab OT Goals Patient Stated Goal: to lay down OT Goal Formulation: With patient Time For Goal Achievement: 02/08/24 Potential to Achieve Goals: Good ADL Goals Additional ADL Goal #1: pt will navigate hospital room with CGA and LRAD to demosntate reduced fall risk   OT Frequency:  Min 1X/week       AM-PAC OT 6 Clicks Daily Activity     Outcome Measure Help from another person eating meals?: A Lot Help from another person taking care of personal grooming?: A Lot Help from another person toileting, which includes using toliet, bedpan, or urinal?: A Lot Help from another person bathing (including washing, rinsing, drying)?: A Lot Help from another person to put on and taking off regular upper body clothing?: A Lot Help from another person to put on  and taking off regular lower body clothing?: A Lot 6 Click Score: 12   End of Session Nurse Communication: Mobility status  Activity Tolerance: Patient tolerated treatment well Patient left: in bed;with call bell/phone within reach;with bed alarm set  OT Visit Diagnosis: Unsteadiness on feet (R26.81);Other abnormalities of gait and mobility (R26.89);Muscle weakness (generalized) (M62.81);History of falling (Z91.81)                Time: 8482-8465 OT Time Calculation (min): 17 min Charges:  OT General Charges $OT Visit: 1 Visit OT Evaluation $OT Eval Low Complexity: 1 Low  [name], OTR/L Acute Rehabilitation Services Office [phone] Secure Chat Communication Preferred   [name] 01/25/2024, 3:46 PM

## 2024-01-25 NOTE — ED Notes (Addendum)
 Pt assisted on bedpan, pt repositioned in bed, bed alarm on. Pt also pulled out IV, bleeding controlled, RN notified.

## 2024-01-25 NOTE — H&P (Signed)
 " History and Physical    Patient: [name] FMW:[id number] DOB: Apr 26, 1934 DOA: 01/24/2024 DOS: the patient was seen and examined on 01/25/2024 PCP: [facility]  Patient coming from: Home  Chief Complaint:  Chief Complaint  Patient presents with   Fall    thinners   HPI: [name] is a 89 y.o. 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   Pt is a poor historian and unable to provide medica history due to dementia. Family unable to be reached by phone. From what I can gather per Epic review, pt presents emergency department for evaluation of a fall on blood thinners. Patient was reportedly standing upright when she fell forward hitting her head and her knees. Patient did not lose consciousness. This was a witnessed fall at her facility. Patient is currently reporting pain to her left side, around her rib cage, her bilateral knees and left ankle. As she also reports significant discomfort in the c-collar. Per EMS, patient has a GCS of 14, which is her baseline and has been acting normally.  In the ED, pt hypertensive and tachypneic w/o hypoxia. Labs notable for Na 148>145, K 3.1>3.5, Cr 1.1>1.2, Hb 10.2>7.1 (spurious)>10.7, and positive FOBT. CTH w/ NAICA. CT cervical spine w/o acute injury. CT abd/pelvis w/o acute findings. EDP requested Hb monitoring while given age and OAC use.   Review of Systems: As mentioned in the history of present illness. All other systems reviewed and are negative. Past Medical History:  Diagnosis Date   Anorexia    Anxiety    Blood transfusion without reported diagnosis    1970's   Chronic diarrhea    CKD (chronic kidney disease)    Clotting disorder    Dementia (HCC)    Depression    DVT (deep venous thrombosis) (HCC)    Endometrial ca (HCC)    GERD (gastroesophageal  reflux disease)    Hiatal hernia    History of D&C    History of sinus surgery    Hypokalemia    IBS (irritable bowel syndrome)    Insomnia    Insomnia    Multiple thyroid  nodules 12/2005   b/l nodules thyroid     Neuropathy    Parkinson's disease (HCC)    Pulmonary embolus (HCC)    Radiation Jan.3-Feb.6,2008   External Beam to pelvis   Radiation Feb.13,20 and 27/2008   Intracavitary brachytherapy   Seizure disorder (HCC)    Stroke (HCC)    R brain Left hemiparesis mild   Past Surgical History:  Procedure Laterality Date   ABDOMINAL HYSTERECTOMY  08/11/05   tah,bso,pelvic lymphadenectomy   ankle fracture repair Left    antroscopy     maxillary   CHOLECYSTECTOMY     COLONOSCOPY  2012   benign colonic polyps   ESOPHAGOGASTRODUODENOSCOPY  2012   neg   IVC Filter  2007   s/p DVT and P.E>   LAPAROSCOPIC ASSISTED RADICAL VAGINAL HYSTERECTOMY W/ NODE BIOPSY  08/11/05   LYMPHADENECTOMY     rotator cuff repair Right    Social History:  reports that she has never smoked. She has never used smokeless tobacco. She reports that she does not drink alcohol and does not use drugs.  Allergies[1]  Family History  Problem Relation Age of Onset   Stomach cancer Sister    Brain  cancer Brother    Lung cancer Brother    Breast cancer Sister    Colon cancer Neg Hx     Prior to Admission medications  Medication Sig Start Date End Date Taking? Authorizing Provider  acetaminophen  (TYLENOL ) 650 MG CR tablet Take 1,300 mg by mouth every 8 (eight) hours as needed for pain.   Yes [provider]  apixaban  (ELIQUIS ) 2.5 MG TABS tablet Take 2.5 mg by mouth 2 (two) times daily.   Yes [provider]  bisacodyl  (DULCOLAX) 10 MG suppository Place 10 mg rectally daily as needed for moderate constipation.   Yes [provider]  calcium  carbonate (TUMS EX) 750 MG chewable tablet Chew 750 mg by mouth 2 (two) times daily.   Yes [provider]  carbidopa -levodopa   (SINEMET  IR) 25-100 MG tablet Take 1 tablet by mouth 4 (four) times daily. 1000, 1400, 1800, 2200   Yes [provider]  cholecalciferol  (VITAMIN D ) 1000 UNITS tablet Take 1,000 Units by mouth daily.   Yes [provider]  Cyanocobalamin  (VITAMIN B 12) 100 MCG LOZG Take 1,000 mcg by mouth daily.   Yes [provider]  escitalopram  (LEXAPRO ) 5 MG tablet Take 1 tablet (5 mg total) by mouth daily. Patient taking differently: Take 10 mg by mouth daily. 04/08/18  Yes Lissa Earnie RAMAN, MD  lidocaine  4 % Place 1 patch onto the skin daily.   Yes [provider]  magnesium  gluconate (MAGONATE) 500 MG tablet Take 500 mg by mouth 2 (two) times daily.   Yes [provider]  magnesium  hydroxide (MILK OF MAGNESIA) 400 MG/5ML suspension Take 30 mLs by mouth daily as needed for mild constipation.   Yes [provider]  mirtazapine  (REMERON ) 7.5 MG tablet Take 1 tablet (7.5 mg total) by mouth at bedtime. Patient taking differently: Take 15 mg by mouth at bedtime. 11/24/22  Yes Tobie Yetta HERO, MD  Multiple Vitamin (MULTIVITAMIN WITH MINERALS) TABS tablet Take 1 tablet by mouth daily. 11/24/22  Yes Tobie Yetta HERO, MD  NON FORMULARY Take 120 mLs by mouth in the morning, at noon, and at bedtime. Magic Cup   Yes [provider]  Pimavanserin  Tartrate (NUPLAZID ) 34 MG CAPS Take 34 mg by mouth at bedtime.   Yes [provider]  Skin Protectants, Misc. (MINERIN CREME EX) Apply 1 Application topically daily.   Yes [provider]  Sodium Phosphates (ENEMA RE) Place 1 enema rectally daily as needed (for constipation).   Yes [provider]  thiamine  (VITAMIN B-1) 100 MG tablet Take 1 tablet (100 mg total) by mouth daily. 11/24/22  Yes Tobie Yetta HERO, MD    Physical Exam: Vitals:   01/25/24 0851 01/25/24 0915 01/25/24 1000 01/25/24 1149  BP: (!) 150/63 (!) 166/85  130/63  Pulse: 75 85  69  Resp: 16 18  18   Temp: 98.4 F (36.9 C)      TempSrc: Oral     SpO2: 99% 97%  99%  Weight:   48.7 kg   Height:       General: Alert, oriented x1 (person only), resting comfortably in no acute distress Respiratory: Lungs clear to auscultation bilaterally with normal respiratory effort; no w/r/r Cardiovascular: Regular rate and rhythm w/o m/r/g Abdomen: Soft, nontender, nondistended. Positive bowel sounds   Data Reviewed:  Lab Results  Component Value Date   WBC 5.0 01/24/2024   HGB 10.7 (L) 01/24/2024   HCT 33.1 (L) 01/24/2024   MCV 95.1 01/24/2024  PLT PLATELET CLUMPS NOTED ON SMEAR, UNABLE TO ESTIMATE 01/24/2024   Lab Results  Component Value Date   GLUCOSE 105 (H) 01/24/2024   CALCIUM  9.3 01/24/2024   NA 145 01/24/2024   K 3.5 01/24/2024   CO2 30 01/24/2024   CL 108 01/24/2024   BUN 26 (H) 01/24/2024   CREATININE 1.21 (H) 01/24/2024   Lab Results  Component Value Date   ALT <5 05/27/2023   AST 19 05/27/2023   ALKPHOS 49 05/27/2023   BILITOT 0.6 05/27/2023   Lab Results  Component Value Date   INR 1.5 (H) 11/18/2022   INR 1.4 (H) 10/10/2022   INR 2.2 (H) 04/07/2018   Radiology: CT ABDOMEN PELVIS W CONTRAST Result Date: 01/25/2024 EXAM: CT ABDOMEN AND PELVIS WITH CONTRAST 01/24/2024 11:59:48 PM TECHNIQUE: CT of the abdomen and pelvis was performed with the administration of 50 mL (iohexol  (OMNIPAQUE ) 350 MG/ML injection 50 mL IOHEXOL  350 MG/ML SOLN) intravenous contrast. Multiplanar reformatted images are provided for review. Automated exposure control, iterative reconstruction, and/or weight-based adjustment of the mA/kV was utilized to reduce the radiation dose to as low as reasonably achievable. COMPARISON: 11/18/2022 CLINICAL HISTORY: positive hemoccult FINDINGS: LOWER CHEST: Mild bilateral lower lobe atelectasis. LIVER: Liver is unremarkable. GALLBLADDER AND BILE DUCTS: Status post cholecystectomy. Mild central intrahepatic and extrahepatic ductal dilatation. Common duct measures 12 mm and smoothly tapers  at the ampulla, likely postsurgical. SPLEEN: No acute abnormality. PANCREAS: No acute abnormality. ADRENAL GLANDS: No acute abnormality. KIDNEYS, URETERS AND BLADDER: No stones in the kidneys or ureters. No hydronephrosis. No perinephric or periureteral stranding. Urinary bladder is distended. GI AND BOWEL: Stomach demonstrates no acute abnormality. There is no bowel obstruction. Moderate colonic stool burden, suggesting mild constipation. PERITONEUM AND RETROPERITONEUM: Trace pelvic ascites. No free air. VASCULATURE: Aorta is normal in caliber. Atherosclerotic calcifications of the abdominal aorta and branch vessels, although patent. IVC filter in satisfactory position. LYMPH NODES: No lymphadenopathy. REPRODUCTIVE ORGANS: Status post hysterectomy. BONES AND SOFT TISSUES: Mild degenerative changes of the lumbar spine. No focal soft tissue abnormality. IMPRESSION: 1. No acute findings in the abdomen or pelvis. 2. Aortic Atherosclerosis (ICD10-I70.0). Electronically signed by: Pinkie Pebbles MD 01/25/2024 12:07 AM EST RP Workstation: [id number]   CT CERVICAL SPINE WO CONTRAST Result Date: 01/24/2024 EXAM: CT CERVICAL SPINE WITHOUT CONTRAST 01/24/2024 07:47:16 PM TECHNIQUE: CT of the cervical spine was performed without the administration of intravenous contrast. Multiplanar reformatted images are provided for review. Automated exposure control, iterative reconstruction, and/or weight based adjustment of the mA/kV was utilized to reduce the radiation dose to as low as reasonably achievable. COMPARISON: 12/17/2023 CLINICAL HISTORY: Polytrauma, blunt. FINDINGS: BONES AND ALIGNMENT: No acute fracture or traumatic malalignment. There are a few small lucent foci in the cervical spine which are unchanged and likely reflect osteopenia. DEGENERATIVE CHANGES: Degenerative changes at the bilateral Atlantoaxial articulations. Intervertebral disc spaces are relatively maintained. There are mild endplate degenerative changes  at multiple levels. No high grade osseous spinal canal stenosis. Facet arthrosis at multiple levels. SOFT TISSUES: No prevertebral soft tissue swelling. Atherosclerosis at the carotid bifurcations. Heterogeneous appearance of the thyroid  with multiple subcentimeter nodules noted. IMPRESSION: 1. No acute traumatic injury. Electronically signed by: Donnice Mania MD 01/24/2024 08:15 PM EST RP Workstation: [id number]   CT HEAD WO CONTRAST Result Date: 01/24/2024 EXAM: CT HEAD WITHOUT CONTRAST 01/24/2024 07:47:16 PM TECHNIQUE: CT of the head was performed without the administration of intravenous contrast. Automated exposure control, iterative reconstruction, and/or weight based adjustment of the mA/kV was utilized  to reduce the radiation dose to as low as reasonably achievable. COMPARISON: 12/17/2023 CLINICAL HISTORY: Head trauma, moderate-severe. FINDINGS: BRAIN AND VENTRICLES: No acute hemorrhage. No evidence of acute infarct. Similar appearance of remote cortical infarct in the right superior frontal gyrus. Mild chronic microvascular ischemic changes. Atherosclerosis of the carotid siphons. No hydrocephalus. No extra-axial collection. No mass effect or midline shift. ORBITS: Bilateral lens replacement. There is mild soft tissue swelling along the lateral aspect of the right orbit overlying the zygomatic arch. Recommend correlation with trauma at this site. SINUSES: Mucosal thickening and layering secretions in the right sphenoid sinus which could be associated with acute sinusitis. SOFT TISSUES AND SKULL: No acute soft tissue abnormality. No skull fracture. IMPRESSION: 1. No acute intracranial abnormality. 2. Mild soft tissue swelling along the lateral aspect of the right orbit overlying the zygomatic arch, which may reflect injury at this site. 3. Mucosal thickening and layering secretions in the right sphenoid sinus, possibly associated with acute sinusitis. Electronically signed by: Donnice Mania MD 01/24/2024  08:11 PM EST RP Workstation: [id number]   DG Shoulder Left Port Result Date: 01/24/2024 EXAM: 2 VIEW(S) XRAY OF THE SHOULDER 01/24/2024 07:31:00 PM COMPARISON: None available. CLINICAL HISTORY: Fall FINDINGS: BONES AND JOINTS: Glenohumeral joint is normally aligned. No acute fracture. No malalignment. Mild degenerative changes of the acromioclavicular joint. SOFT TISSUES: No abnormal calcifications. Visualized lung is unremarkable. IMPRESSION: 1. No acute traumatic injury. Electronically signed by: Pinkie Pebbles MD 01/24/2024 08:02 PM EST RP Workstation: [id number]   DG Ankle Complete Left Result Date: 01/24/2024 CLINICAL DATA:  Fall EXAM: LEFT ANKLE COMPLETE - 3+ VIEW COMPARISON:  None Available. FINDINGS: Soft tissue swelling. No definitive fracture or malalignment. Mortise grossly symmetric. Mild medial and lateral degenerative changes IMPRESSION: Soft tissue swelling without definitive fracture. Electronically Signed   By: Luke Bun M.D.   On: 01/24/2024 19:42   DG Knee Left Port Result Date: 01/24/2024 CLINICAL DATA:  Fall EXAM: PORTABLE LEFT KNEE - 1-2 VIEW COMPARISON:  None Available. FINDINGS: No fracture or malalignment. Mild tricompartment arthritis of the knee. No sizable effusion. Joint space calcification consistent with chondrocalcinosis. IMPRESSION: No acute osseous abnormality Electronically Signed   By: Luke Bun M.D.   On: 01/24/2024 19:39   DG Knee Right Port Result Date: 01/24/2024 CLINICAL DATA:  Fall EXAM: PORTABLE RIGHT KNEE - 1-2 VIEW COMPARISON:  None Available. FINDINGS: No fracture or malalignment.  No sizable knee effusion IMPRESSION: No acute osseous abnormality. Electronically Signed   By: Luke Bun M.D.   On: 01/24/2024 19:38   DG Pelvis Portable Result Date: 01/24/2024 CLINICAL DATA:  Fall EXAM: PORTABLE PELVIS 1-2 VIEWS COMPARISON:  05/27/2023 FINDINGS: SI joints are non widened. Pubic symphysis and rami appear intact. No fracture or malalignment  IMPRESSION: No acute osseous abnormality. Electronically Signed   By: Luke Bun M.D.   On: 01/24/2024 19:37   DG Chest Port 1 View Result Date: 01/24/2024 CLINICAL DATA:  Trauma fall laceration abrasion EXAM: PORTABLE CHEST 1 VIEW COMPARISON:  05/27/2023, 10/10/2022 FINDINGS: No acute airspace disease, pleural effusion, or pneumothorax. The heart is enlarged but stable. Old appearing fracture deformity of the left eighth posterolateral rib. Right hilar convex fullness. IMPRESSION: 1. No active disease. 2. Right hilar convex fullness, probably due to pulmonary vasculature and patient rotation, suggest two-view chest x-ray follow-up when clinically feasible Electronically Signed   By: Luke Bun M.D.   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   GLF R forehead laceration Observed fall at SNF; no LOC per report -PT/OT consulted; apprec eval/recs -Bacitracin  daily w/ mepilex  Positive FOBT -F/u Hb daily and ensure stability prior to discharge given dementia -MIVF: NS at 75cc/h for now -Defer IV PPI BID for now -Consider GI consult pending Hb trend  DVT -PTA Eliquis  2.5mg  BID  Parkinson's disease -PTA Sinemet , Remeron , Lexapro , and pimavanserin  tartrate -IV haldol  x1 prn for agitation   Advance Care Planning:   Code Status: Full Code   Consults: N/A  Family Communication: N/A  Severity of Illness: The appropriate patient status for this patient is INPATIENT. Inpatient status is judged to be reasonable and necessary in order to provide the required intensity of service to ensure the patient's safety. The patient's presenting symptoms, physical exam findings, and initial radiographic and laboratory data in the context of their chronic comorbidities is felt to place them at high risk for further clinical  deterioration. Furthermore, it is not anticipated that the patient will be medically stable for discharge from the hospital within 2 midnights of admission.   * I certify that at the point of admission it is my clinical judgment that the patient will require inpatient hospital care spanning beyond 2 midnights from the point of admission due to high intensity of service, high risk for further deterioration and high frequency of surveillance required.*   ------- I spent 55 minutes reviewing previous notes, at the bedside counseling/discussing the treatment plan, and performing clinical documentation.  Author: Marsha Ada, MD 01/25/2024 1:00 PM  For on call review [email or web address].     [1]  Allergies Allergen Reactions   Aspridrox [Aspirin Buf(Alhyd-Mghyd-Cacar)]     Unknown reaction   Codeine     Unknown reaction   Darvocet [Propoxyphene N-Acetaminophen ]     Unknown reaction    Esomeprazole Magnesium      Unknown reaction   Iodine     Unknown reaction    Morphine And Codeine     Unknown reaction   Sulfa Antibiotics Other (See Comments)    Unknown reaction    Tramadol     Unknown reaction    "

## 2024-01-25 NOTE — ED Provider Notes (Addendum)
 " Results for orders placed or performed during the hospital encounter of 01/24/24  I-stat chem 8, ed   Collection Time: 01/24/24  7:12 PM  Result Value Ref Range   Sodium 148 (H) 135 - 145 mmol/L   Potassium 3.1 (L) 3.5 - 5.1 mmol/L   Chloride 110 98 - 111 mmol/L   BUN 25 (H) 8 - 23 mg/dL   Creatinine, Ser 8.89 (H) 0.44 - 1.00 mg/dL   Glucose, Bld 80 70 - 99 mg/dL   Calcium , Ion 1.17 1.15 - 1.40 mmol/L   TCO2 24 22 - 32 mmol/L   Hemoglobin 10.2 (L) 12.0 - 15.0 g/dL   HCT 69.9 (L) 63.9 - 53.9 %  I-Stat CG4 Lactic Acid, ED   Collection Time: 01/24/24  7:12 PM  Result Value Ref Range   Lactic Acid, Venous 0.7 0.5 - 1.9 mmol/L  CBC with Differential   Collection Time: 01/24/24  7:14 PM  Result Value Ref Range   WBC 4.5 4.0 - 10.5 K/uL   RBC 2.29 (L) 3.87 - 5.11 MIL/uL   Hemoglobin 7.1 (L) 12.0 - 15.0 g/dL   HCT 77.4 (L) 63.9 - 53.9 %   MCV 98.3 80.0 - 100.0 fL   MCH 31.0 26.0 - 34.0 pg   MCHC 31.6 30.0 - 36.0 g/dL   RDW 87.3 88.4 - 84.4 %   Platelets 129 (L) 150 - 400 K/uL   nRBC 0.0 0.0 - 0.2 %   Neutrophils Relative % 65 %   Neutro Abs 2.9 1.7 - 7.7 K/uL   Lymphocytes Relative 28 %   Lymphs Abs 1.2 0.7 - 4.0 K/uL   Monocytes Relative 7 %   Monocytes Absolute 0.3 0.1 - 1.0 K/uL   Eosinophils Relative 0 %   Eosinophils Absolute 0.0 0.0 - 0.5 K/uL   Basophils Relative 0 %   Basophils Absolute 0.0 0.0 - 0.1 K/uL   Immature Granulocytes 0 %   Abs Immature Granulocytes 0.02 0.00 - 0.07 K/uL  Basic metabolic panel   Collection Time: 01/24/24  7:14 PM  Result Value Ref Range   Sodium 145 135 - 145 mmol/L   Potassium 3.5 3.5 - 5.1 mmol/L   Chloride 108 98 - 111 mmol/L   CO2 30 22 - 32 mmol/L   Glucose, Bld 105 (H) 70 - 99 mg/dL   BUN 26 (H) 8 - 23 mg/dL   Creatinine, Ser 8.78 (H) 0.44 - 1.00 mg/dL   Calcium  9.3 8.9 - 10.3 mg/dL   GFR, Estimated 43 (L) >60 mL/min   Anion gap 7 5 - 15  APTT   Collection Time: 01/24/24  7:14 PM  Result Value Ref Range   aPTT 27 24 - 36  seconds  CBC with Differential   Collection Time: 01/24/24  7:54 PM  Result Value Ref Range   WBC 5.0 4.0 - 10.5 K/uL   RBC 3.48 (L) 3.87 - 5.11 MIL/uL   Hemoglobin 10.7 (L) 12.0 - 15.0 g/dL   HCT 66.8 (L) 63.9 - 53.9 %   MCV 95.1 80.0 - 100.0 fL   MCH 30.7 26.0 - 34.0 pg   MCHC 32.3 30.0 - 36.0 g/dL   RDW 87.3 88.4 - 84.4 %   Platelets PLATELET CLUMPS NOTED ON SMEAR, UNABLE TO ESTIMATE 150 - 400 K/uL   nRBC 0.0 0.0 - 0.2 %   Neutrophils Relative % 66 %   Neutro Abs 3.3 1.7 - 7.7 K/uL   Lymphocytes Relative 27 %   Lymphs  Abs 1.4 0.7 - 4.0 K/uL   Monocytes Relative 6 %   Monocytes Absolute 0.3 0.1 - 1.0 K/uL   Eosinophils Relative 1 %   Eosinophils Absolute 0.0 0.0 - 0.5 K/uL   Basophils Relative 0 %   Basophils Absolute 0.0 0.0 - 0.1 K/uL   Immature Granulocytes 0 %   Abs Immature Granulocytes 0.02 0.00 - 0.07 K/uL  POC occult blood, ED   Collection Time: 01/24/24  8:03 PM  Result Value Ref Range   Fecal Occult Bld POSITIVE (A) NEGATIVE   CT ABDOMEN PELVIS W CONTRAST Result Date: 01/25/2024 EXAM: CT ABDOMEN AND PELVIS WITH CONTRAST 01/24/2024 11:59:48 PM TECHNIQUE: CT of the abdomen and pelvis was performed with the administration of 50 mL (iohexol  (OMNIPAQUE ) 350 MG/ML injection 50 mL IOHEXOL  350 MG/ML SOLN) intravenous contrast. Multiplanar reformatted images are provided for review. Automated exposure control, iterative reconstruction, and/or weight-based adjustment of the mA/kV was utilized to reduce the radiation dose to as low as reasonably achievable. COMPARISON: 11/18/2022 CLINICAL HISTORY: positive hemoccult FINDINGS: LOWER CHEST: Mild bilateral lower lobe atelectasis. LIVER: Liver is unremarkable. GALLBLADDER AND BILE DUCTS: Status post cholecystectomy. Mild central intrahepatic and extrahepatic ductal dilatation. Common duct measures 12 mm and smoothly tapers at the ampulla, likely postsurgical. SPLEEN: No acute abnormality. PANCREAS: No acute abnormality. ADRENAL GLANDS:  No acute abnormality. KIDNEYS, URETERS AND BLADDER: No stones in the kidneys or ureters. No hydronephrosis. No perinephric or periureteral stranding. Urinary bladder is distended. GI AND BOWEL: Stomach demonstrates no acute abnormality. There is no bowel obstruction. Moderate colonic stool burden, suggesting mild constipation. PERITONEUM AND RETROPERITONEUM: Trace pelvic ascites. No free air. VASCULATURE: Aorta is normal in caliber. Atherosclerotic calcifications of the abdominal aorta and branch vessels, although patent. IVC filter in satisfactory position. LYMPH NODES: No lymphadenopathy. REPRODUCTIVE ORGANS: Status post hysterectomy. BONES AND SOFT TISSUES: Mild degenerative changes of the lumbar spine. No focal soft tissue abnormality. IMPRESSION: 1. No acute findings in the abdomen or pelvis. 2. Aortic Atherosclerosis (ICD10-I70.0). Electronically signed by: Pinkie Pebbles MD 01/25/2024 12:07 AM EST RP Workstation: [id number]   CT CERVICAL SPINE WO CONTRAST Result Date: 01/24/2024 EXAM: CT CERVICAL SPINE WITHOUT CONTRAST 01/24/2024 07:47:16 PM TECHNIQUE: CT of the cervical spine was performed without the administration of intravenous contrast. Multiplanar reformatted images are provided for review. Automated exposure control, iterative reconstruction, and/or weight based adjustment of the mA/kV was utilized to reduce the radiation dose to as low as reasonably achievable. COMPARISON: 12/17/2023 CLINICAL HISTORY: Polytrauma, blunt. FINDINGS: BONES AND ALIGNMENT: No acute fracture or traumatic malalignment. There are a few small lucent foci in the cervical spine which are unchanged and likely reflect osteopenia. DEGENERATIVE CHANGES: Degenerative changes at the bilateral Atlantoaxial articulations. Intervertebral disc spaces are relatively maintained. There are mild endplate degenerative changes at multiple levels. No high grade osseous spinal canal stenosis. Facet arthrosis at multiple levels. SOFT TISSUES:  No prevertebral soft tissue swelling. Atherosclerosis at the carotid bifurcations. Heterogeneous appearance of the thyroid  with multiple subcentimeter nodules noted. IMPRESSION: 1. No acute traumatic injury. Electronically signed by: Donnice Mania MD 01/24/2024 08:15 PM EST RP Workstation: [id number]   CT HEAD WO CONTRAST Result Date: 01/24/2024 EXAM: CT HEAD WITHOUT CONTRAST 01/24/2024 07:47:16 PM TECHNIQUE: CT of the head was performed without the administration of intravenous contrast. Automated exposure control, iterative reconstruction, and/or weight based adjustment of the mA/kV was utilized to reduce the radiation dose to as low as reasonably achievable. COMPARISON: 12/17/2023 CLINICAL HISTORY: Head trauma, moderate-severe. FINDINGS: BRAIN AND VENTRICLES: No  acute hemorrhage. No evidence of acute infarct. Similar appearance of remote cortical infarct in the right superior frontal gyrus. Mild chronic microvascular ischemic changes. Atherosclerosis of the carotid siphons. No hydrocephalus. No extra-axial collection. No mass effect or midline shift. ORBITS: Bilateral lens replacement. There is mild soft tissue swelling along the lateral aspect of the right orbit overlying the zygomatic arch. Recommend correlation with trauma at this site. SINUSES: Mucosal thickening and layering secretions in the right sphenoid sinus which could be associated with acute sinusitis. SOFT TISSUES AND SKULL: No acute soft tissue abnormality. No skull fracture. IMPRESSION: 1. No acute intracranial abnormality. 2. Mild soft tissue swelling along the lateral aspect of the right orbit overlying the zygomatic arch, which may reflect injury at this site. 3. Mucosal thickening and layering secretions in the right sphenoid sinus, possibly associated with acute sinusitis. Electronically signed by: Donnice Mania MD 01/24/2024 08:11 PM EST RP Workstation: [id number]   DG Shoulder Left Port Result Date: 01/24/2024 EXAM: 2 VIEW(S) XRAY OF  THE SHOULDER 01/24/2024 07:31:00 PM COMPARISON: None available. CLINICAL HISTORY: Fall FINDINGS: BONES AND JOINTS: Glenohumeral joint is normally aligned. No acute fracture. No malalignment. Mild degenerative changes of the acromioclavicular joint. SOFT TISSUES: No abnormal calcifications. Visualized lung is unremarkable. IMPRESSION: 1. No acute traumatic injury. Electronically signed by: Pinkie Pebbles MD 01/24/2024 08:02 PM EST RP Workstation: [id number]   DG Ankle Complete Left Result Date: 01/24/2024 CLINICAL DATA:  Fall EXAM: LEFT ANKLE COMPLETE - 3+ VIEW COMPARISON:  None Available. FINDINGS: Soft tissue swelling. No definitive fracture or malalignment. Mortise grossly symmetric. Mild medial and lateral degenerative changes IMPRESSION: Soft tissue swelling without definitive fracture. Electronically Signed   By: Luke Bun M.D.   On: 01/24/2024 19:42   DG Knee Left Port Result Date: 01/24/2024 CLINICAL DATA:  Fall EXAM: PORTABLE LEFT KNEE - 1-2 VIEW COMPARISON:  None Available. FINDINGS: No fracture or malalignment. Mild tricompartment arthritis of the knee. No sizable effusion. Joint space calcification consistent with chondrocalcinosis. IMPRESSION: No acute osseous abnormality Electronically Signed   By: Luke Bun M.D.   On: 01/24/2024 19:39   DG Knee Right Port Result Date: 01/24/2024 CLINICAL DATA:  Fall EXAM: PORTABLE RIGHT KNEE - 1-2 VIEW COMPARISON:  None Available. FINDINGS: No fracture or malalignment.  No sizable knee effusion IMPRESSION: No acute osseous abnormality. Electronically Signed   By: Luke Bun M.D.   On: 01/24/2024 19:38   DG Pelvis Portable Result Date: 01/24/2024 CLINICAL DATA:  Fall EXAM: PORTABLE PELVIS 1-2 VIEWS COMPARISON:  05/27/2023 FINDINGS: SI joints are non widened. Pubic symphysis and rami appear intact. No fracture or malalignment IMPRESSION: No acute osseous abnormality. Electronically Signed   By: Luke Bun M.D.   On: 01/24/2024 19:37   DG  Chest Port 1 View Result Date: 01/24/2024 CLINICAL DATA:  Trauma fall laceration abrasion EXAM: PORTABLE CHEST 1 VIEW COMPARISON:  05/27/2023, 10/10/2022 FINDINGS: No acute airspace disease, pleural effusion, or pneumothorax. The heart is enlarged but stable. Old appearing fracture deformity of the left eighth posterolateral rib. Right hilar convex fullness. IMPRESSION: 1. No active disease. 2. Right hilar convex fullness, probably due to pulmonary vasculature and patient rotation, suggest two-view chest x-ray follow-up when clinically feasible Electronically Signed   By: Luke Bun M.D.   On: 01/24/2024 19:36    CT does not show any acute bleeding or inflammatory process.  As she is chronically anticoagulated with eliquis , will admit for close observation.  Spoke with Dr. Alfornia--  AM team will be admitting.  Jarold Olam HERO, PA-C 01/25/24 0128    Jarold Olam HERO, PA-C 01/25/24 0128    Haze Lonni PARAS, MD 01/25/24 2307  "

## 2024-01-25 NOTE — ED Notes (Signed)
 2W notified of patient transport to floor.

## 2024-01-25 NOTE — ED Notes (Signed)
 Patient returned from CT

## 2024-01-26 DIAGNOSIS — R296 Repeated falls: Secondary | ICD-10-CM

## 2024-01-26 LAB — BASIC METABOLIC PANEL WITH GFR
Anion gap: 9 (ref 5–15)
BUN: 15 mg/dL (ref 8–23)
CO2: 26 mmol/L (ref 22–32)
Calcium: 9.7 mg/dL (ref 8.9–10.3)
Chloride: 106 mmol/L (ref 98–111)
Creatinine, Ser: 1.06 mg/dL — ABNORMAL HIGH (ref 0.44–1.00)
GFR, Estimated: 51 mL/min — ABNORMAL LOW
Glucose, Bld: 76 mg/dL (ref 70–99)
Potassium: 3.4 mmol/L — ABNORMAL LOW (ref 3.5–5.1)
Sodium: 141 mmol/L (ref 135–145)

## 2024-01-26 LAB — CBC
HCT: 33.4 % — ABNORMAL LOW (ref 36.0–46.0)
Hemoglobin: 10.9 g/dL — ABNORMAL LOW (ref 12.0–15.0)
MCH: 30.4 pg (ref 26.0–34.0)
MCHC: 32.6 g/dL (ref 30.0–36.0)
MCV: 93.3 fL (ref 80.0–100.0)
Platelets: 191 K/uL (ref 150–400)
RBC: 3.58 MIL/uL — ABNORMAL LOW (ref 3.87–5.11)
RDW: 12.3 % (ref 11.5–15.5)
WBC: 7.9 K/uL (ref 4.0–10.5)
nRBC: 0 % (ref 0.0–0.2)

## 2024-01-26 MED ORDER — POTASSIUM CHLORIDE CRYS ER 20 MEQ PO TBCR
40.0000 meq | EXTENDED_RELEASE_TABLET | Freq: Once | ORAL | Status: DC
  Filled 2024-01-26: qty 2

## 2024-01-26 NOTE — Progress Notes (Signed)
 " Progress Note   Patient: [name] FMW:[id number] DOB: 10/14/1934 DOA: 01/24/2024     0 DOS: the patient was seen and examined on 01/26/2024   Brief hospital course: From HPI [name] is a 89 y.o. 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    Pt is a poor historian and unable to provide medica history due to dementia. Family unable to be reached by phone. From what I can gather per Epic review, pt presents emergency department for evaluation of a fall on blood thinners. Patient was reportedly standing upright when she fell forward hitting her head and her knees. Patient did not lose consciousness. This was a witnessed fall at her facility. Patient is currently reporting pain to her left side, around her rib cage, her bilateral knees and left ankle. As she also reports significant discomfort in the c-collar. Per EMS, patient has a GCS of 14, which is her baseline and has been acting normally.   In the ED, pt hypertensive and tachypneic w/o hypoxia. Labs notable for Na 148>145, K 3.1>3.5, Cr 1.1>1.2, Hb 10.2>7.1 (spurious)>10.7, and positive FOBT. CTH w/ NAICA. CT cervical spine w/o acute injury. CT abd/pelvis w/o acute findings. EDP requested Hb monitoring while given age and OAC use.      Assessment and Plan:  Ground-level fall R forehead laceration Patient from facility I reached out to [facility] who is working on placement Continue PT OT   Positive FOBT Denies active GI bleed Continue PPI therapy Hemoglobin remains stable   DVT -PTA Eliquis  2.5mg  BID   Parkinson's disease -PTA Sinemet , Remeron , Lexapro , and pimavanserin  tartrate -IV haldol  x1 prn for agitation     Advance Care Planning:   Code Status: Full Code    Consults: N/A   Family Communication: N/A   Subjective:  Seen and examined at bedside this  morning Exhibiting symptoms of dementia Medically ready pending placement  Physical Exam: General: Alert, oriented x1 (person only), resting comfortably in no acute distress Respiratory: Lungs clear to auscultation bilaterally with normal respiratory effort; no w/r/r Cardiovascular: Regular rate and rhythm w/o m/r/g Abdomen: Soft, nontender, nondistended. Positive bowel sounds    Vitals:   01/26/24 0015 01/26/24 0427 01/26/24 0748 01/26/24 1226  BP: (!) 160/68 128/74 139/69 (!) 156/70  Pulse: 65 65 73 77  Resp:   18 17  Temp: 98.4 F (36.9 C)   98 F (36.7 C)  TempSrc: Oral   Oral  SpO2: 100% 94% 97% 97%  Weight:      Height:        Data Reviewed:    Latest Ref Rng & Units 01/26/2024    4:22 AM 01/24/2024    7:54 PM 01/24/2024    7:14 PM  CBC  WBC 4.0 - 10.5 K/uL 7.9  5.0  4.5   Hemoglobin 12.0 - 15.0 g/dL 89.0  89.2  7.1   Hematocrit 36.0 - 46.0 % 33.4  33.1  22.5   Platelets 150 - 400 K/uL 191  PLATELET CLUMPS NOTED ON SMEAR, UNABLE TO ESTIMATE  129        Latest Ref Rng & Units 01/26/2024    4:22 AM 01/24/2024    7:14 PM 01/24/2024    7:12 PM  BMP  Glucose 70 - 99 mg/dL 76  894  80   BUN 8 - 23  mg/dL 15  26  25    Creatinine 0.44 - 1.00 mg/dL 8.93  8.78  8.89   Sodium 135 - 145 mmol/L 141  145  148   Potassium 3.5 - 5.1 mmol/L 3.4  3.5  3.1   Chloride 98 - 111 mmol/L 106  108  110   CO2 22 - 32 mmol/L 26  30    Calcium  8.9 - 10.3 mg/dL 9.7  9.3       Author: Drue ONEIDA Potter, MD 01/26/2024 2:46 PM  For on call review [email or web address].  "

## 2024-01-26 NOTE — Evaluation (Signed)
 Physical Therapy Evaluation Patient Details Name: [name] MRN: [id number] DOB: 1934/05/17 Today's Date: 01/26/2024  History of Present Illness  [name] is a 89 yo female who presented from her facility after a fall. 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  Previously living at facility with assist for transfers to wheelchair and for ADL.  Currently mod A for moving to EOB and mod/max A for sit to stand with RW with c/o pain in feet.  She may benefit from skilled PT in the acute setting for progressing mobility as able and decrease burden of care to return back to LTC facility at d/c.       If plan is discharge home, recommend the following: A lot of help with bathing/dressing/bathroom;A lot of help with walking and/or transfers   Can travel by private vehicle   No    Equipment Recommendations None recommended by PT  Recommendations for Other Services       Functional Status Assessment Patient has had a recent decline in their functional status and/or demonstrates limited ability to make significant improvements in function in a reasonable and predictable amount of time     Precautions / Restrictions Precautions Precautions: Fall      Mobility  Bed Mobility Overal bed mobility: Needs Assistance Bed Mobility: Supine to Sit, Sit to Supine     Supine to sit: HOB elevated, Mod assist Sit to supine: Mod assist   General bed mobility comments: assist for legs off EOB and with increaesed time to lift trunk; to supine A for legs into bed    Transfers Overall transfer level: Needs assistance Equipment used: Rolling walker (2 wheels) Transfers: Sit to/from Stand Sit to Stand: Mod assist, Max assist, From elevated  surface           General transfer comment: cues for feet under her, c/o pain in feet, lifting help to stand and A for using RW for support; stood x 2 for hygiene and bed linen change    Ambulation/Gait Ambulation/Gait assistance: Mod assist Gait Distance (Feet): 2 Feet Assistive device: Rolling walker (2 wheels) Gait Pattern/deviations: Step-to pattern, Decreased stride length, Trunk flexed       General Gait Details: backing up toward HOB prior to sitting back on EOB using RW and A For balance  Stairs            Wheelchair Mobility     Tilt Bed    Modified Rankin (Stroke Patients Only)       Balance Overall balance assessment: Needs assistance Sitting-balance support: Feet supported Sitting balance-Leahy Scale: Fair     Standing balance support: Bilateral upper extremity supported Standing balance-Leahy Scale: Poor                               Pertinent Vitals/Pain Pain Assessment Pain Assessment: Faces Faces Pain Scale: Hurts little more Pain Location: legs, R arm Pain Descriptors / Indicators: Discomfort, Grimacing, Guarding Pain Intervention(s): Monitored during session, Repositioned, Limited activity within patient's tolerance    Home Living Family/patient expects to be discharged to:: Skilled nursing facility                   Additional Comments: [facility]  Prior Function Prior Level of Function : Needs assist             Mobility Comments: Per chart review, pt mobilized with RW and in WC. Staff assists wtih transfers and mobility ADLs Comments: per chart review, staff assists with all aspects of ADLs     Extremity/Trunk Assessment   Upper Extremity Assessment Upper Extremity Assessment: Defer to OT evaluation (holds R arm against her body not wanting to extend)    Lower Extremity Assessment Lower Extremity Assessment: Generalized weakness    Cervical / Trunk Assessment Cervical / Trunk Assessment:  Kyphotic  Communication   Communication Communication: Impaired Factors Affecting Communication: Difficulty expressing self;Reduced clarity of speech    Cognition Arousal: Alert Behavior During Therapy: Flat affect   PT - Cognitive impairments: History of cognitive impairments, No family/caregiver present to determine baseline                       PT - Cognition Comments: per chart advanced dementia Following commands: Impaired Following commands impaired: Follows one step commands with increased time, Follows one step commands inconsistently     Cueing       General Comments General comments (skin integrity, edema, etc.): sitter in the room, assisted with hygiene in standing and for linen change; changed gown and NT helped with sponge bath; continued holding R arm to her side and noted prior surgical scar on shoulder    Exercises     Assessment/Plan    PT Assessment Patient needs continued PT services  PT Problem List Decreased mobility;Decreased strength;Decreased balance;Decreased knowledge of use of DME       PT Treatment Interventions DME instruction;Functional mobility training;Therapeutic activities;Therapeutic exercise;Balance training;Patient/family education    PT Goals (Current goals can be found in the Care Plan section)  Acute Rehab PT Goals Patient Stated Goal: unable to state PT Goal Formulation: Patient unable to participate in goal setting Time For Goal Achievement: 02/09/24 Potential to Achieve Goals: Fair    Frequency Min 2X/week     Co-evaluation               AM-PAC PT 6 Clicks Mobility  Outcome Measure Help needed turning from your back to your side while in a flat bed without using bedrails?: Total Help needed moving from lying on your back to sitting on the side of a flat bed without using bedrails?: Total Help needed moving to and from a bed to a chair (including a wheelchair)?: Total Help needed standing up from a chair  using your arms (e.g., wheelchair or bedside chair)?: A Lot Help needed to walk in hospital room?: Total Help needed climbing 3-5 steps with a railing? : Total 6 Click Score: 7    End of Session Equipment Utilized During Treatment: Gait belt Activity Tolerance: Patient limited by pain Patient left: in bed;with call bell/phone within reach;with nursing/sitter in room   PT Visit Diagnosis: Muscle weakness (generalized) (M62.81);Other abnormalities of gait and mobility (R26.89)    Time: 8784-8757 PT Time Calculation (min) (ACUTE ONLY): 27 min   Charges:   PT Evaluation $PT Eval Moderate Complexity: 1 Mod PT Treatments $Therapeutic Activity: 8-22 mins PT General Charges $$ ACUTE PT VISIT: 1 Visit         Micheline Portal, PT Acute Rehabilitation Services [phone] 01/26/2024   Montie Portal 01/26/2024, 2:03 PM

## 2024-01-27 DIAGNOSIS — R296 Repeated falls: Secondary | ICD-10-CM | POA: Diagnosis not present

## 2024-01-27 LAB — BASIC METABOLIC PANEL WITH GFR
Anion gap: 9 (ref 5–15)
BUN: 13 mg/dL (ref 8–23)
CO2: 27 mmol/L (ref 22–32)
Calcium: 10 mg/dL (ref 8.9–10.3)
Chloride: 105 mmol/L (ref 98–111)
Creatinine, Ser: 1.13 mg/dL — ABNORMAL HIGH (ref 0.44–1.00)
GFR, Estimated: 47 mL/min — ABNORMAL LOW
Glucose, Bld: 80 mg/dL (ref 70–99)
Potassium: 3.3 mmol/L — ABNORMAL LOW (ref 3.5–5.1)
Sodium: 142 mmol/L (ref 135–145)

## 2024-01-27 LAB — CBC
HCT: 33.8 % — ABNORMAL LOW (ref 36.0–46.0)
Hemoglobin: 11.3 g/dL — ABNORMAL LOW (ref 12.0–15.0)
MCH: 30.7 pg (ref 26.0–34.0)
MCHC: 33.4 g/dL (ref 30.0–36.0)
MCV: 91.8 fL (ref 80.0–100.0)
Platelets: 190 K/uL (ref 150–400)
RBC: 3.68 MIL/uL — ABNORMAL LOW (ref 3.87–5.11)
RDW: 12.2 % (ref 11.5–15.5)
WBC: 7.9 K/uL (ref 4.0–10.5)
nRBC: 0 % (ref 0.0–0.2)

## 2024-01-27 MED ORDER — POTASSIUM CHLORIDE CRYS ER 20 MEQ PO TBCR: 20.0000 meq | EXTENDED_RELEASE_TABLET | Freq: Every day | ORAL | Status: AC

## 2024-01-27 NOTE — Progress Notes (Addendum)
 Spoke with Nurse Warren at [facility] and gave report for patient returning to room 213. No questions or concerns at this time.

## 2024-01-27 NOTE — Plan of Care (Signed)

## 2024-01-27 NOTE — TOC Transition Note (Signed)
 Transition of Care [facility]) - Discharge Note   Patient Details  Name: [name] MRN: [id number] Date of Birth: [birth year]  [facility] CM/SW Contact:  Sherline Clack, LCSWA Phone Number: 01/27/2024, 12:20 PM   Clinical Narrative:     Patient will DC to: [facility] Anticipated DC date: 01/27/24  Family notified: Shelly/daughter Transport by: [facility] transport   Per MD patient ready for DC to [facility]. RN to call report prior to discharge [phone], rm [phone]). RN, patient, patient's family, and facility notified of DC. Discharge Summary and FL2 sent to facility. DC packet on chart. Ambulance transport requested for patient.   CSW will sign off for now as social work intervention is no longer needed. Please consult us  again if new needs arise.    Final next level of care: Skilled Nursing Facility Barriers to Discharge: Barriers Resolved   Patient Goals and CMS Choice            Discharge Placement              Patient chooses bed at: [facility] Patient to be transferred to facility by: [facility] Name of family member notified: Shelly/daughter Patient and family notified of of transfer: 01/27/24  Discharge Plan and Services Additional resources added to the After Visit Summary for                                       Social Drivers of Health (SDOH) Interventions SDOH Screenings   Food Insecurity: Patient Unable To Answer (01/25/2024)  Housing: Unknown (01/25/2024)  Transportation Needs: Patient Unable To Answer (01/25/2024)  Utilities: Patient Unable To Answer (01/25/2024)  Social Connections: Patient Unable To Answer (01/25/2024)  Tobacco Use: Low Risk (12/17/2023)     Readmission Risk Interventions    11/19/2022    5:50 PM  Readmission Risk Prevention Plan  Transportation Screening Complete  PCP or Specialist Appt within 5-7 Days Complete  Home Care Screening Complete  Medication Review (RN CM)  Referral to Pharmacy

## 2024-01-27 NOTE — [place] FL2 (Signed)
 " [place]  MEDICAID FL2 LEVEL OF CARE FORM     IDENTIFICATION  Patient Name: [name] Birthdate: 06/03/1934 Sex: female Admission Date (Current Location): 01/24/2024  [facility] and [place] Number:  Producer, Television/film/video and Address:  [facility], [address], [place], [place] [address]      Provider Number: [id number]  Attending Physician Name and Address:  Dorinda Drue DASEN, MD  Relative Name and Phone Number:  Rico Auvil/daughter: [phone]    Current Level of Care: Hospital Recommended Level of Care: Skilled Nursing Facility Prior Approval Number:    Date Approved/Denied:   PASRR Number: [id number]  Discharge Plan: SNF    Current Diagnoses: Patient Active Problem List   Diagnosis Date Noted   Multiple falls 01/26/2024   GIB (gastrointestinal bleeding) 01/25/2024   Protein-calorie malnutrition, severe 11/23/2022   Perforated diverticulum 11/18/2022   Intestinal perforation (HCC) 11/18/2022   Aphasia 04/06/2018   Dysphagia 03/11/2016   Irritable bowel syndrome with diarrhea 03/11/2016   History of colonic polyps 05/24/2013   Malignant neoplasm of corpus uteri, except isthmus (HCC) 12/22/2010   Pulmonary embolus (HCC)    DVT (deep venous thrombosis) (HCC)    Chronic diarrhea    Insomnia    Multiple thyroid  nodules    Anorexia     Orientation RESPIRATION BLADDER Height & Weight     Self  Normal Incontinent Weight: 107 lb 5.8 oz (48.7 kg) Height:  5' 7 (170.2 cm)  BEHAVIORAL SYMPTOMS/MOOD NEUROLOGICAL BOWEL NUTRITION STATUS      Incontinent Diet (please refer to DC summary)  AMBULATORY STATUS COMMUNICATION OF NEEDS Skin   Limited Assist   Normal                       Personal Care Assistance Level of Assistance  Bathing, Feeding, Dressing Bathing Assistance: Limited assistance Feeding assistance: Limited assistance Dressing Assistance: Limited assistance     Functional Limitations Info  Sight, Speech, Hearing  Sight Info: Adequate Hearing Info: Adequate Speech Info: Adequate    SPECIAL CARE FACTORS FREQUENCY  PT (By licensed PT), OT (By licensed OT)     PT Frequency: 5x/week OT Frequency: 5x/week            Contractures Contractures Info: Not present    Additional Factors Info  Code Status, Allergies Code Status Info: Full Allergies Info: Aspridrox (Aspirin Buf(Alhyd-Mghyd-Cacar)), Codeine, Darvocet (Propoxyphene N-Acetaminophen ), Esomeprazole Magnesium , Iodine, Morphine And Codeine, Sulfa Antibiotics, Tramadol           Current Medications (01/27/2024):  This is the current hospital active medication list Current Facility-Administered Medications  Medication Dose Route Frequency Provider Last Rate Last Admin   apixaban  (ELIQUIS ) tablet 2.5 mg  2.5 mg Oral BID Moore, Willie, MD   2.5 mg at 01/27/24 0920   bacitracin  ointment 1 Application  1 Application Topical Daily Georgina Basket, MD   1 Application at 01/27/24 0921   calcium  carbonate (TUMS - dosed in mg elemental calcium ) chewable tablet 300 mg of elemental calcium   750 mg Oral BID Moore, Willie, MD   300 mg of elemental calcium  at 01/27/24 0919   carbidopa -levodopa  (SINEMET  IR) 25-100 MG per tablet immediate release 1 tablet  1 tablet Oral QID Georgina Basket, MD   1 tablet at 01/27/24 0920   cholecalciferol  (VITAMIN D3) 25 MCG (1000 UNIT) tablet 1,000 Units  1,000 Units Oral Daily Georgina Basket, MD   1,000 Units at 01/27/24 0920   escitalopram  (LEXAPRO )  tablet 10 mg  10 mg Oral Daily Georgina Basket, MD   10 mg at 01/27/24 0920   haloperidol  lactate (HALDOL ) injection 1 mg  1 mg Intravenous Once Dufour, Gabrielle S, PA-C       iohexol  (OMNIPAQUE ) 350 MG/ML injection 50 mL  50 mL Intravenous Once PRN Torrence, Gabrielle S, PA-C       magnesium  gluconate (MAGONATE) tablet 500 mg  500 mg Oral BID Georgina Basket, MD   500 mg at 01/27/24 0920   mirtazapine  (REMERON ) tablet 7.5 mg  7.5 mg Oral QHS Moore, Willie, MD   7.5 mg at 01/26/24 2209    multivitamin with minerals tablet 1 tablet  1 tablet Oral Daily Georgina Basket, MD   1 tablet at 01/27/24 0920   pantoprazole  (PROTONIX ) injection 40 mg  40 mg Intravenous Q12H Moore, Willie, MD   40 mg at 01/27/24 0920   potassium chloride  SA (KLOR-CON  M) CR tablet 40 mEq  40 mEq Oral Once Dorinda Homans T, MD       thiamine  (VITAMIN B1) tablet 100 mg  100 mg Oral Daily Georgina Basket, MD   100 mg at 01/27/24 0920     Discharge Medications: Please see discharge summary for a list of discharge medications.  Relevant Imaging Results:  Relevant Lab Results:   Additional Information SSN: [id number]  Sherline Clack, LCSWA     "

## 2024-01-27 NOTE — TOC Progression Note (Signed)
 Transition of Care [facility]) - Progression Note    Patient Details  Name: [name] MRN: [id number] Date of Birth: 07-18-1934  Transition of Care [facility]) CM/SW Contact  Sherline Clack, [place] Phone Number: 01/27/2024, 10:34 AM  Clinical Narrative:     Patient able to discharge to [facility] today 1/22. Provider made aware, [facility] able to transport patient today. CSW will continue to follow.  Anticipated Discharge Plan: Skilled Nursing Facility Barriers to Discharge: No Barriers Identified                    Expected Discharge Plan and Services                                               Social Drivers of Health (SDOH) Interventions SDOH Screenings   Food Insecurity: Patient Unable To Answer (01/25/2024)  Housing: Unknown (01/25/2024)  Transportation Needs: Patient Unable To Answer (01/25/2024)  Utilities: Patient Unable To Answer (01/25/2024)  Social Connections: Patient Unable To Answer (01/25/2024)  Tobacco Use: Low Risk (12/17/2023)    Readmission Risk Interventions    11/19/2022    5:50 PM  Readmission Risk Prevention Plan  Transportation Screening Complete  PCP or Specialist Appt within 5-7 Days Complete  Home Care Screening Complete  Medication Review (RN CM) Referral to Pharmacy

## 2024-01-27 NOTE — Discharge Summary (Signed)
 " Physician Discharge Summary   Patient: [name] MRN: [id number] DOB: 29-Dec-1934  Admit date:     01/24/2024  Discharge date: 01/27/24  Discharge Physician: Drue ONEIDA Potter   PCP: [facility]   Recommendations at discharge:  Follow-up with PCP  Discharge Diagnoses: Principal Problem:   GIB (gastrointestinal bleeding) Active Problems:   Multiple falls  Resolved Problems:   * No resolved hospital problems. [facility] Course:  From HPI [name] is a 89 y.o. 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    Pt is a poor historian and unable to provide medica history due to dementia. Family unable to be reached by phone. From what I can gather per Epic review, pt presents emergency department for evaluation of a fall on blood thinners. Patient was reportedly standing upright when she fell forward hitting her head and her knees. Patient did not lose consciousness. This was a witnessed fall at her facility. Patient is currently reporting pain to her left side, around her rib cage, her bilateral knees and left ankle. As she also reports significant discomfort in the c-collar. Per EMS, patient has a GCS of 14, which is her baseline and has been acting normally.   In the ED, pt hypertensive and tachypneic w/o hypoxia. Labs notable for Na 148>145, K 3.1>3.5, Cr 1.1>1.2, Hb 10.2>7.1 (spurious)>10.7, and positive FOBT. CTH w/ NAICA. CT cervical spine w/o acute injury. CT abd/pelvis w/o acute findings. EDP requested Hb monitoring while given age and OAC use.     Other hospital course as noted below  Assessment and Plan:  Ground-level fall R forehead laceration Patient worked with PT OT and back to her baseline   Positive FOBT Denies active GI bleed Continue PPI therapy Hemoglobin remains stable with no  evidence of bleeding   DVT Continue Eliquis  2.5mg  BID   Parkinson's disease Continue Sinemet , Remeron , Lexapro , and pimavanserin  tartrate   Consultants: PT OT Procedures performed: None Disposition: Skilled nursing facility Diet recommendation:  Cardiac diet DISCHARGE MEDICATION: Allergies as of 01/27/2024       Reactions   Aspridrox [aspirin Buf(alhyd-mghyd-cacar)]    Unknown reaction   Codeine    Unknown reaction   Darvocet [propoxyphene N-acetaminophen ]    Unknown reaction   Esomeprazole Magnesium     Unknown reaction   Iodine    Unknown reaction   Morphine And Codeine    Unknown reaction   Sulfa Antibiotics Other (See Comments)   Unknown reaction   Tramadol    Unknown reaction        Medication List     TAKE these medications    carbidopa -levodopa  25-100 MG tablet Commonly known as: SINEMET  IR Take 1 tablet by mouth 4 (four) times daily. 1000, 1400, 1800, 2200 The timing of this medication is very important.   acetaminophen  650 MG CR tablet Commonly known as: TYLENOL  Take 1,300 mg by mouth every 8 (eight) hours as needed for pain.   bisacodyl  10 MG suppository Commonly known as: DULCOLAX Place 10 mg rectally daily as needed for moderate constipation.   calcium  carbonate 750 MG chewable tablet Commonly known as: TUMS EX Chew 750 mg by mouth 2 (two) times daily.   cholecalciferol  1000 units tablet Commonly known as: VITAMIN D  Take 1,000 Units by mouth daily.   Eliquis  2.5 MG Tabs tablet  Generic drug: apixaban  Take 2.5 mg by mouth 2 (two) times daily.   ENEMA RE Place 1 enema rectally daily as needed (for constipation).   escitalopram  5 MG tablet Commonly known as: LEXAPRO  Take 1 tablet (5 mg total) by mouth daily. What changed: how much to take   lidocaine  4 % Place 1 patch onto the skin daily.   magnesium  gluconate 500 MG tablet Commonly known as: MAGONATE Take 500 mg by mouth 2 (two) times daily.   magnesium  hydroxide 400 MG/5ML  suspension Commonly known as: MILK OF MAGNESIA Take 30 mLs by mouth daily as needed for mild constipation.   MINERIN CREME EX Apply 1 Application topically daily.   mirtazapine  7.5 MG tablet Commonly known as: REMERON  Take 1 tablet (7.5 mg total) by mouth at bedtime. What changed: how much to take   multivitamin with minerals Tabs tablet Take 1 tablet by mouth daily.   NON FORMULARY Take 120 mLs by mouth in the morning, at noon, and at bedtime. Magic Cup   Nuplazid  34 MG Caps Generic drug: Pimavanserin  Tartrate Take 34 mg by mouth at bedtime.   potassium chloride  SA 20 MEQ tablet Commonly known as: KLOR-CON  M Take 1 tablet (20 mEq total) by mouth daily.   thiamine  100 MG tablet Commonly known as: Vitamin B-1 Take 1 tablet (100 mg total) by mouth daily.   Vitamin B 12 100 MCG Lozg Take 1,000 mcg by mouth daily.        Discharge Exam: [name]   01/24/24 1927 01/25/24 1000  Weight: 45 kg 48.7 kg   General: Alert, oriented x1 (person only), resting comfortably in no acute distress Respiratory: Lungs clear to auscultation bilaterally with normal respiratory effort; no w/r/r Cardiovascular: Regular rate and rhythm w/o m/r/g Abdomen: Soft, nontender, nondistended. Positive bowel sounds  Condition at discharge: good  The results of significant diagnostics from this hospitalization (including imaging, microbiology, ancillary and laboratory) are listed below for reference.   Imaging Studies: CT ABDOMEN PELVIS W CONTRAST Result Date: 01/25/2024 EXAM: CT ABDOMEN AND PELVIS WITH CONTRAST 01/24/2024 11:59:48 PM TECHNIQUE: CT of the abdomen and pelvis was performed with the administration of 50 mL (iohexol  (OMNIPAQUE ) 350 MG/ML injection 50 mL IOHEXOL  350 MG/ML SOLN) intravenous contrast. Multiplanar reformatted images are provided for review. Automated exposure control, iterative reconstruction, and/or weight-based adjustment of the mA/kV was utilized to reduce the  radiation dose to as low as reasonably achievable. COMPARISON: 11/18/2022 CLINICAL HISTORY: positive hemoccult FINDINGS: LOWER CHEST: Mild bilateral lower lobe atelectasis. LIVER: Liver is unremarkable. GALLBLADDER AND BILE DUCTS: Status post cholecystectomy. Mild central intrahepatic and extrahepatic ductal dilatation. Common duct measures 12 mm and smoothly tapers at the ampulla, likely postsurgical. SPLEEN: No acute abnormality. PANCREAS: No acute abnormality. ADRENAL GLANDS: No acute abnormality. KIDNEYS, URETERS AND BLADDER: No stones in the kidneys or ureters. No hydronephrosis. No perinephric or periureteral stranding. Urinary bladder is distended. GI AND BOWEL: Stomach demonstrates no acute abnormality. There is no bowel obstruction. Moderate colonic stool burden, suggesting mild constipation. PERITONEUM AND RETROPERITONEUM: Trace pelvic ascites. No free air. VASCULATURE: Aorta is normal in caliber. Atherosclerotic calcifications of the abdominal aorta and branch vessels, although patent. IVC filter in satisfactory position. LYMPH NODES: No lymphadenopathy. REPRODUCTIVE ORGANS: Status post hysterectomy. BONES AND SOFT TISSUES: Mild degenerative changes of the lumbar spine. No focal soft tissue abnormality. IMPRESSION: 1. No acute findings in the abdomen or pelvis. 2. Aortic Atherosclerosis (ICD10-I70.0). Electronically signed by: Pinkie Pebbles MD 01/25/2024 12:07 AM EST RP Workstation:  [id number]   CT CERVICAL SPINE WO CONTRAST Result Date: 01/24/2024 EXAM: CT CERVICAL SPINE WITHOUT CONTRAST 01/24/2024 07:47:16 PM TECHNIQUE: CT of the cervical spine was performed without the administration of intravenous contrast. Multiplanar reformatted images are provided for review. Automated exposure control, iterative reconstruction, and/or weight based adjustment of the mA/kV was utilized to reduce the radiation dose to as low as reasonably achievable. COMPARISON: 12/17/2023 CLINICAL HISTORY: Polytrauma, blunt.  FINDINGS: BONES AND ALIGNMENT: No acute fracture or traumatic malalignment. There are a few small lucent foci in the cervical spine which are unchanged and likely reflect osteopenia. DEGENERATIVE CHANGES: Degenerative changes at the bilateral Atlantoaxial articulations. Intervertebral disc spaces are relatively maintained. There are mild endplate degenerative changes at multiple levels. No high grade osseous spinal canal stenosis. Facet arthrosis at multiple levels. SOFT TISSUES: No prevertebral soft tissue swelling. Atherosclerosis at the carotid bifurcations. Heterogeneous appearance of the thyroid  with multiple subcentimeter nodules noted. IMPRESSION: 1. No acute traumatic injury. Electronically signed by: Donnice Mania MD 01/24/2024 08:15 PM EST RP Workstation: [id number]   CT HEAD WO CONTRAST Result Date: 01/24/2024 EXAM: CT HEAD WITHOUT CONTRAST 01/24/2024 07:47:16 PM TECHNIQUE: CT of the head was performed without the administration of intravenous contrast. Automated exposure control, iterative reconstruction, and/or weight based adjustment of the mA/kV was utilized to reduce the radiation dose to as low as reasonably achievable. COMPARISON: 12/17/2023 CLINICAL HISTORY: Head trauma, moderate-severe. FINDINGS: BRAIN AND VENTRICLES: No acute hemorrhage. No evidence of acute infarct. Similar appearance of remote cortical infarct in the right superior frontal gyrus. Mild chronic microvascular ischemic changes. Atherosclerosis of the carotid siphons. No hydrocephalus. No extra-axial collection. No mass effect or midline shift. ORBITS: Bilateral lens replacement. There is mild soft tissue swelling along the lateral aspect of the right orbit overlying the zygomatic arch. Recommend correlation with trauma at this site. SINUSES: Mucosal thickening and layering secretions in the right sphenoid sinus which could be associated with acute sinusitis. SOFT TISSUES AND SKULL: No acute soft tissue abnormality. No skull  fracture. IMPRESSION: 1. No acute intracranial abnormality. 2. Mild soft tissue swelling along the lateral aspect of the right orbit overlying the zygomatic arch, which may reflect injury at this site. 3. Mucosal thickening and layering secretions in the right sphenoid sinus, possibly associated with acute sinusitis. Electronically signed by: Donnice Mania MD 01/24/2024 08:11 PM EST RP Workstation: [id number]   DG Shoulder Left Port Result Date: 01/24/2024 EXAM: 2 VIEW(S) XRAY OF THE SHOULDER 01/24/2024 07:31:00 PM COMPARISON: None available. CLINICAL HISTORY: Fall FINDINGS: BONES AND JOINTS: Glenohumeral joint is normally aligned. No acute fracture. No malalignment. Mild degenerative changes of the acromioclavicular joint. SOFT TISSUES: No abnormal calcifications. Visualized lung is unremarkable. IMPRESSION: 1. No acute traumatic injury. Electronically signed by: Pinkie Pebbles MD 01/24/2024 08:02 PM EST RP Workstation: [id number]   DG Ankle Complete Left Result Date: 01/24/2024 CLINICAL DATA:  Fall EXAM: LEFT ANKLE COMPLETE - 3+ VIEW COMPARISON:  None Available. FINDINGS: Soft tissue swelling. No definitive fracture or malalignment. Mortise grossly symmetric. Mild medial and lateral degenerative changes IMPRESSION: Soft tissue swelling without definitive fracture. Electronically Signed   By: Luke Bun M.D.   On: 01/24/2024 19:42   DG Knee Left Port Result Date: 01/24/2024 CLINICAL DATA:  Fall EXAM: PORTABLE LEFT KNEE - 1-2 VIEW COMPARISON:  None Available. FINDINGS: No fracture or malalignment. Mild tricompartment arthritis of the knee. No sizable effusion. Joint space calcification consistent with chondrocalcinosis. IMPRESSION: No acute osseous abnormality Electronically Signed   By: Luke Bun HERO.D.  On: 01/24/2024 19:39   DG Knee Right Port Result Date: 01/24/2024 CLINICAL DATA:  Fall EXAM: PORTABLE RIGHT KNEE - 1-2 VIEW COMPARISON:  None Available. FINDINGS: No fracture or malalignment.  No  sizable knee effusion IMPRESSION: No acute osseous abnormality. Electronically Signed   By: Luke Bun M.D.   On: 01/24/2024 19:38   DG Pelvis Portable Result Date: 01/24/2024 CLINICAL DATA:  Fall EXAM: PORTABLE PELVIS 1-2 VIEWS COMPARISON:  05/27/2023 FINDINGS: SI joints are non widened. Pubic symphysis and rami appear intact. No fracture or malalignment IMPRESSION: No acute osseous abnormality. Electronically Signed   By: Luke Bun M.D.   On: 01/24/2024 19:37   DG Chest Port 1 View Result Date: 01/24/2024 CLINICAL DATA:  Trauma fall laceration abrasion EXAM: PORTABLE CHEST 1 VIEW COMPARISON:  05/27/2023, 10/10/2022 FINDINGS: No acute airspace disease, pleural effusion, or pneumothorax. The heart is enlarged but stable. Old appearing fracture deformity of the left eighth posterolateral rib. Right hilar convex fullness. IMPRESSION: 1. No active disease. 2. Right hilar convex fullness, probably due to pulmonary vasculature and patient rotation, suggest two-view chest x-ray follow-up when clinically feasible Electronically Signed   By: Luke Bun M.D.   On: 01/24/2024 19:36    Microbiology: Results for orders placed or performed during the hospital encounter of 11/18/22  Blood Culture (routine x 2)     Status: None   Collection Time: 11/18/22 11:38 AM   Specimen: BLOOD  Result Value Ref Range Status   Specimen Description   Final    BLOOD LEFT ANTECUBITAL Performed at [facility], [address] [place], [place] [address]    Special Requests   Final    BOTTLES DRAWN AEROBIC AND ANAEROBIC Blood Culture adequate volume Performed at [facility], [address], [place], [place] [address]    Culture   Final    NO GROWTH 5 DAYS Performed at [facility], [address], [place], [place] [address]    Report Status 11/23/2022 FINAL  Final  Blood Culture (routine x 2)     Status: None   Collection Time: 11/18/22 11:50 AM   Specimen: BLOOD   Result Value Ref Range Status   Specimen Description   Final    BLOOD RIGHT ANTECUBITAL Performed at [facility], [address], [place], [place] [address]    Special Requests   Final    BOTTLES DRAWN AEROBIC AND ANAEROBIC Blood Culture adequate volume Performed at [facility], [address], [place], [place] [address]    Culture   Final    NO GROWTH 5 DAYS Performed at [facility], [address], [place], [place] [address]    Report Status 11/23/2022 FINAL  Final  Resp panel by RT-PCR (RSV, Flu A&B, Covid) Anterior Nasal Swab     Status: None   Collection Time: 11/18/22 12:02 PM   Specimen: Anterior Nasal Swab  Result Value Ref Range Status   SARS Coronavirus 2 by RT PCR NEGATIVE NEGATIVE Final    Comment: (NOTE) SARS-CoV-2 target nucleic acids are NOT DETECTED.  The SARS-CoV-2 RNA is generally detectable in upper respiratory specimens during the acute phase of infection. The lowest concentration of SARS-CoV-2 viral copies this assay can detect is 138 copies/mL. A negative result does not preclude SARS-Cov-2 infection and should not be used as the sole basis for treatment or other patient management decisions. A negative result may occur with  improper specimen collection/handling, submission of specimen  other than nasopharyngeal swab, presence of viral mutation(s) within the areas targeted by this assay, and inadequate number of viral copies(<138 copies/mL). A negative result must be combined with clinical observations, patient history, and epidemiological information. The expected result is Negative.  Fact Sheet for Patients:  bloggercourse.com  Fact Sheet for Healthcare Providers:  seriousbroker.it  This test is no t yet approved or cleared by the [place]  FDA and  has been authorized for detection and/or diagnosis of SARS-CoV-2 by FDA under an Emergency Use  Authorization (EUA). This EUA will remain  in effect (meaning this test can be used) for the duration of the COVID-19 declaration under Section 564(b)(1) of the Act, 21 U.S.C.section 360bbb-3(b)(1), unless the authorization is terminated  or revoked sooner.       Influenza A by PCR NEGATIVE NEGATIVE Final   Influenza B by PCR NEGATIVE NEGATIVE Final    Comment: (NOTE) The Xpert Xpress SARS-CoV-2/FLU/RSV plus assay is intended as an aid in the diagnosis of influenza from Nasopharyngeal swab specimens and should not be used as a sole basis for treatment. Nasal washings and aspirates are unacceptable for Xpert Xpress SARS-CoV-2/FLU/RSV testing.  Fact Sheet for Patients: bloggercourse.com  Fact Sheet for Healthcare Providers: seriousbroker.it  This test is not yet approved or cleared by the [place]  FDA and has been authorized for detection and/or diagnosis of SARS-CoV-2 by FDA under an Emergency Use Authorization (EUA). This EUA will remain in effect (meaning this test can be used) for the duration of the COVID-19 declaration under Section 564(b)(1) of the Act, 21 U.S.C. section 360bbb-3(b)(1), unless the authorization is terminated or revoked.     Resp Syncytial Virus by PCR NEGATIVE NEGATIVE Final    Comment: (NOTE) Fact Sheet for Patients: bloggercourse.com  Fact Sheet for Healthcare Providers: seriousbroker.it  This test is not yet approved or cleared by the [place]  FDA and has been authorized for detection and/or diagnosis of SARS-CoV-2 by FDA under an Emergency Use Authorization (EUA). This EUA will remain in effect (meaning this test can be used) for the duration of the COVID-19 declaration under Section 564(b)(1) of the Act, 21 U.S.C. section 360bbb-3(b)(1), unless the authorization is terminated or revoked.  Performed at [facility],  [address] [place], [place] [address]   MRSA Next Gen by PCR, Nasal     Status: None   Collection Time: 11/19/22  4:32 PM   Specimen: Nasal Mucosa; Nasal Swab  Result Value Ref Range Status   MRSA by PCR Next Gen NOT DETECTED NOT DETECTED Final    Comment: (NOTE) The GeneXpert MRSA Assay (FDA approved for NASAL specimens only), is one component of a comprehensive MRSA colonization surveillance program. It is not intended to diagnose MRSA infection nor to guide or monitor treatment for MRSA infections. Test performance is not FDA approved in patients less than 9 years old. Performed at [facility], [address], [place], [place] [address]     Labs: CBC: Recent Labs  Lab 01/24/24 1912 01/24/24 1914 01/24/24 1954 01/26/24 0422 01/27/24 0525  WBC  --  4.5 5.0 7.9 7.9  NEUTROABS  --  2.9 3.3  --   --   HGB 10.2* 7.1* 10.7* 10.9* 11.3*  HCT 30.0* 22.5* 33.1* 33.4* 33.8*  MCV  --  98.3 95.1 93.3 91.8  PLT  --  129* PLATELET CLUMPS NOTED ON SMEAR, UNABLE TO ESTIMATE 191 190   Basic Metabolic Panel: Recent Labs  Lab 01/24/24 1912 01/24/24 1914  01/26/24 0422 01/27/24 0525  NA 148* 145 141 142  K 3.1* 3.5 3.4* 3.3*  CL 110 108 106 105  CO2  --  30 26 27   GLUCOSE 80 105* 76 80  BUN 25* 26* 15 13  CREATININE 1.10* 1.21* 1.06* 1.13*  CALCIUM   --  9.3 9.7 10.0   Liver Function Tests: No results for input(s): AST, ALT, ALKPHOS, BILITOT, PROT, ALBUMIN in the last 168 hours. CBG: No results for input(s): GLUCAP in the last 168 hours.  Discharge time spent:  35 minutes.  Signed: Drue ONEIDA Potter, MD [facility] 01/27/2024 "
